# Patient Record
Sex: Female | Born: 1935 | Race: White | Hispanic: No | Marital: Married
Health system: Midwestern US, Community
[De-identification: ages and names within clinical notes are randomized; demographics above are authoritative.]

## PROBLEM LIST (undated history)

## (undated) DIAGNOSIS — I10 Essential (primary) hypertension: Secondary | ICD-10-CM

## (undated) DIAGNOSIS — E878 Other disorders of electrolyte and fluid balance, not elsewhere classified: Secondary | ICD-10-CM

## (undated) DIAGNOSIS — R5383 Other fatigue: Secondary | ICD-10-CM

## (undated) DIAGNOSIS — F419 Anxiety disorder, unspecified: Secondary | ICD-10-CM

## (undated) DIAGNOSIS — M109 Gout, unspecified: Secondary | ICD-10-CM

## (undated) DIAGNOSIS — J309 Allergic rhinitis, unspecified: Secondary | ICD-10-CM

## (undated) DIAGNOSIS — M810 Age-related osteoporosis without current pathological fracture: Secondary | ICD-10-CM

## (undated) DIAGNOSIS — F329 Major depressive disorder, single episode, unspecified: Secondary | ICD-10-CM

## (undated) DIAGNOSIS — M069 Rheumatoid arthritis, unspecified: Secondary | ICD-10-CM

## (undated) DIAGNOSIS — G4730 Sleep apnea, unspecified: Secondary | ICD-10-CM

## (undated) DIAGNOSIS — S39012A Strain of muscle, fascia and tendon of lower back, initial encounter: Secondary | ICD-10-CM

## (undated) DIAGNOSIS — J40 Bronchitis, not specified as acute or chronic: Secondary | ICD-10-CM

## (undated) DIAGNOSIS — R109 Unspecified abdominal pain: Secondary | ICD-10-CM

## (undated) DIAGNOSIS — E7800 Pure hypercholesterolemia, unspecified: Secondary | ICD-10-CM

## (undated) DIAGNOSIS — R519 Headache, unspecified: Secondary | ICD-10-CM

## (undated) DIAGNOSIS — E039 Hypothyroidism, unspecified: Secondary | ICD-10-CM

## (undated) DIAGNOSIS — L0390 Cellulitis, unspecified: Secondary | ICD-10-CM

## (undated) DIAGNOSIS — K5792 Diverticulitis of intestine, part unspecified, without perforation or abscess without bleeding: Secondary | ICD-10-CM

## (undated) DIAGNOSIS — G43909 Migraine, unspecified, not intractable, without status migrainosus: Secondary | ICD-10-CM

## (undated) DIAGNOSIS — M419 Scoliosis, unspecified: Secondary | ICD-10-CM

## (undated) DIAGNOSIS — K5790 Diverticulosis of intestine, part unspecified, without perforation or abscess without bleeding: Secondary | ICD-10-CM

## (undated) DIAGNOSIS — K219 Gastro-esophageal reflux disease without esophagitis: Secondary | ICD-10-CM

## (undated) DIAGNOSIS — M2550 Pain in unspecified joint: Secondary | ICD-10-CM

## (undated) DIAGNOSIS — R51 Headache: Secondary | ICD-10-CM

## (undated) DIAGNOSIS — R52 Pain, unspecified: Secondary | ICD-10-CM

## (undated) DIAGNOSIS — R0602 Shortness of breath: Secondary | ICD-10-CM

## (undated) DIAGNOSIS — J189 Pneumonia, unspecified organism: Secondary | ICD-10-CM

## (undated) DIAGNOSIS — L0291 Cutaneous abscess, unspecified: Secondary | ICD-10-CM

## (undated) DIAGNOSIS — Z95828 Presence of other vascular implants and grafts: Secondary | ICD-10-CM

## (undated) DIAGNOSIS — R1310 Dysphagia, unspecified: Secondary | ICD-10-CM

## (undated) DIAGNOSIS — J9690 Respiratory failure, unspecified, unspecified whether with hypoxia or hypercapnia: Principal | ICD-10-CM

## (undated) DIAGNOSIS — R0600 Dyspnea, unspecified: Secondary | ICD-10-CM

## (undated) DIAGNOSIS — M79602 Pain in left arm: Secondary | ICD-10-CM

## (undated) DIAGNOSIS — R609 Edema, unspecified: Secondary | ICD-10-CM

## (undated) DIAGNOSIS — Z452 Encounter for adjustment and management of vascular access device: Secondary | ICD-10-CM

## (undated) DIAGNOSIS — J398 Other specified diseases of upper respiratory tract: Secondary | ICD-10-CM

## (undated) DIAGNOSIS — Z4659 Encounter for fitting and adjustment of other gastrointestinal appliance and device: Secondary | ICD-10-CM

## (undated) HISTORY — DX: Essential (primary) hypertension: I10

## (undated) HISTORY — DX: Gastro-esophageal reflux disease without esophagitis: K21.9

## (undated) HISTORY — PX: TOTAL ABDOMINAL HYSTERECTOMY: SHX209

## (undated) HISTORY — DX: Major depressive disorder, single episode, unspecified: F32.9

## (undated) HISTORY — DX: Diverticulitis of intestine, part unspecified, without perforation or abscess without bleeding: K57.92

## (undated) HISTORY — DX: Pain in unspecified joint: M25.50

## (undated) HISTORY — DX: Scoliosis, unspecified: M41.9

## (undated) HISTORY — DX: Pure hypercholesterolemia, unspecified: E78.00

## (undated) HISTORY — DX: Gout, unspecified: M10.9

## (undated) HISTORY — DX: Headache: R51

## (undated) HISTORY — PX: CHOLECYSTECTOMY: SHX55

## (undated) HISTORY — DX: Age-related osteoporosis without current pathological fracture: M81.0

## (undated) HISTORY — DX: Other fatigue: R53.83

## (undated) HISTORY — DX: Unspecified abdominal pain: R10.9

## (undated) HISTORY — DX: Migraine, unspecified, not intractable, without status migrainosus: G43.909

## (undated) HISTORY — DX: Allergic rhinitis, unspecified: J30.9

## (undated) HISTORY — DX: Hypothyroidism, unspecified: E03.9

## (undated) HISTORY — DX: Cellulitis, unspecified: L03.90

## (undated) HISTORY — PX: COLONOSCOPY: SHX174

## (undated) HISTORY — DX: Anxiety disorder, unspecified: F41.9

## (undated) HISTORY — PX: CARPAL TUNNEL RELEASE: SHX101

## (undated) HISTORY — DX: Diverticulosis of intestine, part unspecified, without perforation or abscess without bleeding: K57.90

## (undated) HISTORY — DX: Strain of muscle, fascia and tendon of lower back, initial encounter: S39.012A

## (undated) HISTORY — DX: Bronchitis, not specified as acute or chronic: J40

## (undated) HISTORY — PX: HEMORROIDECTOMY: SUR656

## (undated) HISTORY — DX: Other disorders of electrolyte and fluid balance, not elsewhere classified: E87.8

## (undated) HISTORY — DX: Headache, unspecified: R51.9

## (undated) HISTORY — DX: Rheumatoid arthritis, unspecified: M06.9

## (undated) HISTORY — PX: POLYPECTOMY: SHX149

## (undated) HISTORY — PX: UPPER GASTROINTESTINAL ENDOSCOPY: SHX188

---

## 2002-07-11 ENCOUNTER — Ambulatory Visit (HOSPITAL_BASED_OUTPATIENT_CLINIC_OR_DEPARTMENT_OTHER): Admission: RE | Admit: 2002-07-11 | Discharge: 2002-07-11 | Payer: Self-pay | Admitting: Plastic Surgery

## 2002-08-23 ENCOUNTER — Ambulatory Visit (HOSPITAL_COMMUNITY): Admission: RE | Admit: 2002-08-23 | Discharge: 2002-08-23 | Payer: Self-pay | Admitting: Internal Medicine

## 2011-08-20 DIAGNOSIS — Z01419 Encounter for gynecological examination (general) (routine) without abnormal findings: Secondary | ICD-10-CM | POA: Diagnosis not present

## 2011-10-13 DIAGNOSIS — N951 Menopausal and female climacteric states: Secondary | ICD-10-CM | POA: Diagnosis not present

## 2011-10-30 DIAGNOSIS — H01009 Unspecified blepharitis unspecified eye, unspecified eyelid: Secondary | ICD-10-CM | POA: Diagnosis not present

## 2011-10-30 DIAGNOSIS — H538 Other visual disturbances: Secondary | ICD-10-CM | POA: Diagnosis not present

## 2011-10-30 DIAGNOSIS — Z961 Presence of intraocular lens: Secondary | ICD-10-CM | POA: Diagnosis not present

## 2011-11-06 DIAGNOSIS — I10 Essential (primary) hypertension: Secondary | ICD-10-CM | POA: Diagnosis not present

## 2011-11-06 DIAGNOSIS — E7800 Pure hypercholesterolemia, unspecified: Secondary | ICD-10-CM | POA: Diagnosis not present

## 2011-11-12 DIAGNOSIS — E7800 Pure hypercholesterolemia, unspecified: Secondary | ICD-10-CM | POA: Diagnosis not present

## 2011-11-12 DIAGNOSIS — E039 Hypothyroidism, unspecified: Secondary | ICD-10-CM | POA: Diagnosis not present

## 2011-11-12 DIAGNOSIS — I10 Essential (primary) hypertension: Secondary | ICD-10-CM | POA: Diagnosis not present

## 2011-11-12 DIAGNOSIS — J309 Allergic rhinitis, unspecified: Secondary | ICD-10-CM | POA: Diagnosis not present

## 2011-11-12 DIAGNOSIS — K2100 Gastro-esophageal reflux disease with esophagitis, without bleeding: Secondary | ICD-10-CM | POA: Diagnosis not present

## 2011-11-24 DIAGNOSIS — L0211 Cutaneous abscess of neck: Secondary | ICD-10-CM | POA: Diagnosis not present

## 2012-01-03 LAB — COMPREHENSIVE METABOLIC PANEL
ALT: 473 U/L — ABNORMAL HIGH (ref 10–49)
AST: 120 U/L — ABNORMAL HIGH (ref 0–33)
Albumin: 3.5 g/dL (ref 3.2–4.8)
Alkaline Phosphatase: 74 U/L (ref 45–129)
BUN: 14 mg/dL (ref 9–23)
Calcium: 8.6 mg/dL (ref 8.6–10.5)
Chloride: 92 mmol/L — ABNORMAL LOW (ref 99–109)
Creatinine: 0.4 mg/dL — ABNORMAL LOW (ref 0.5–1.1)
Glucose: 213 mg/dL — ABNORMAL HIGH (ref 70–110)
Potassium: 2.9 mmol/L — ABNORMAL LOW (ref 3.5–5.5)
Sodium: 141 mmol/L (ref 132–146)
Total Bilirubin: 0.5 mg/dL (ref 0.3–1.2)
Total Protein: 5.6 g/dL — ABNORMAL LOW (ref 5.7–8.2)

## 2012-01-03 LAB — CBC
Hematocrit: 30.9 % — ABNORMAL LOW (ref 34.0–48.0)
Hemoglobin: 9.9 g/dL — ABNORMAL LOW (ref 11.5–15.5)
MCH: 29.2 pg (ref 26.0–35.0)
MCHC: 32 % (ref 32.0–34.5)
MCV: 91.3 fL (ref 80.0–99.9)
MPV: 8.7 fL (ref 7.0–12.0)
Platelets: 181 E9/L (ref 130–450)
RBC: 3.38 E12/L — ABNORMAL LOW (ref 3.50–5.50)
RDW: 19.7 fL — ABNORMAL HIGH (ref 11.5–15.0)
WBC: 8.1 E9/L (ref 4.5–11.5)

## 2012-01-03 LAB — GFR CALCULATED: Gfr Calculated: >60 mL/min/{1.73_m2} (ref >=60)

## 2012-01-03 LAB — MAGNESIUM: Magnesium: 2.2 mg/dL (ref 1.3–2.7)

## 2012-01-04 LAB — POTASSIUM: Potassium: 3.5 mmol/L (ref 3.5–5.5)

## 2012-01-05 LAB — URINALYSIS WITH MICROSCOPIC
Bilirubin Urine: NEGATIVE
Glucose, Ur: NEGATIVE mg/dL
Ketones, Urine: NEGATIVE mg/dL
Nitrite, Urine: NEGATIVE
Specific Gravity, UA: 1.01 (ref <=1.035)
Urobilinogen, Urine: 0.2 E.U./dL (ref 0.2–1.0)
pH, UA: 6.5 (ref 5.0–8.5)

## 2012-01-05 LAB — BASIC METABOLIC PANEL
BUN: 26 mg/dL — ABNORMAL HIGH (ref 9–23)
Calcium: 9.3 mg/dL (ref 8.6–10.5)
Chloride: 96 mmol/L — ABNORMAL LOW (ref 99–109)
Creatinine: 0.5 mg/dL (ref 0.5–1.1)
Glucose: 245 mg/dL — ABNORMAL HIGH (ref 70–110)
Potassium: 3.4 mmol/L — ABNORMAL LOW (ref 3.5–5.5)
Sodium: 147 mmol/L — ABNORMAL HIGH (ref 132–146)

## 2012-01-05 LAB — GFR CALCULATED: Gfr Calculated: >60 mL/min/{1.73_m2} (ref >=60)

## 2012-01-09 LAB — PREALBUMIN: Prealbumin: 29 mg/dL (ref 10–40)

## 2012-01-16 LAB — PREALBUMIN: Prealbumin: 29 mg/dL (ref 10–40)

## 2012-01-17 LAB — CBC
Hematocrit: 29.9 % — ABNORMAL LOW (ref 34.0–48.0)
Hemoglobin: 9.8 g/dL — ABNORMAL LOW (ref 11.5–15.5)
MCH: 29.2 pg (ref 26.0–35.0)
MCHC: 32.6 % (ref 32.0–34.5)
MCV: 89.6 fL (ref 80.0–99.9)
MPV: 8 fL (ref 7.0–12.0)
Platelets: 195 E9/L (ref 130–450)
RBC: 3.34 E12/L — ABNORMAL LOW (ref 3.50–5.50)
RDW: 17.2 fL — ABNORMAL HIGH (ref 11.5–15.0)
WBC: 5.1 E9/L (ref 4.5–11.5)

## 2012-01-17 LAB — COMPREHENSIVE METABOLIC PANEL
ALT: 158 U/L — ABNORMAL HIGH (ref 10–49)
AST: 58 U/L — ABNORMAL HIGH (ref 0–33)
Albumin: 3.5 g/dL (ref 3.2–4.8)
Alkaline Phosphatase: 70 U/L (ref 45–129)
BUN: 10 mg/dL (ref 9–23)
Calcium: 8.8 mg/dL (ref 8.6–10.5)
Chloride: 94 mmol/L — ABNORMAL LOW (ref 99–109)
Creatinine: 0.5 mg/dL (ref 0.5–1.1)
Glucose: 150 mg/dL — ABNORMAL HIGH (ref 70–110)
Potassium: 3.3 mmol/L — ABNORMAL LOW (ref 3.5–5.5)
Sodium: 142 mmol/L (ref 132–146)
Total Bilirubin: 0.3 mg/dL (ref 0.3–1.2)
Total Protein: 5.8 g/dL (ref 5.7–8.2)

## 2012-01-17 LAB — GFR CALCULATED: Gfr Calculated: >60 mL/min/{1.73_m2} (ref >=60)

## 2012-01-22 LAB — PREALBUMIN: Prealbumin: 31 mg/dL (ref 10–40)

## 2012-01-23 LAB — PREALBUMIN: Prealbumin: 31 mg/dL (ref 10–40)

## 2012-01-25 LAB — COMPREHENSIVE METABOLIC PANEL
ALT: 79 U/L — ABNORMAL HIGH (ref 10–49)
AST: 34 U/L — ABNORMAL HIGH (ref 0–33)
Albumin: 3.9 g/dL (ref 3.2–4.8)
Alkaline Phosphatase: 78 U/L (ref 45–129)
BUN: 10 mg/dL (ref 9–23)
Calcium: 8.9 mg/dL (ref 8.6–10.5)
Chloride: 94 mmol/L — ABNORMAL LOW (ref 99–109)
Creatinine: 0.5 mg/dL (ref 0.5–1.1)
Glucose: 66 mg/dL — ABNORMAL LOW (ref 70–110)
Potassium: 3.1 mmol/L — ABNORMAL LOW (ref 3.5–5.5)
Sodium: 141 mmol/L (ref 132–146)
Total Bilirubin: 0.4 mg/dL (ref 0.3–1.2)
Total Protein: 6.2 g/dL (ref 5.7–8.2)

## 2012-01-25 LAB — CBC WITH AUTO DIFFERENTIAL
Absolute Eos #: 0 E9/L — ABNORMAL LOW (ref 0.05–0.50)
Absolute Lymph #: 2.58 E9/L (ref 1.50–4.00)
Absolute Mono #: 1.09 E9/L — ABNORMAL HIGH (ref 0.10–0.95)
Absolute Neut #: 9.93 E9/L — ABNORMAL HIGH (ref 1.80–7.30)
Basophils Absolute: 0 E9/L (ref 0.00–0.20)
Basophils: 0 % (ref 0–2)
Eosinophils %: 0 % (ref 0–6)
Hematocrit: 35.4 % (ref 34.0–48.0)
Hemoglobin: 11.6 g/dL (ref 11.5–15.5)
Lymphocytes: 19 % — ABNORMAL LOW (ref 20–42)
MCH: 29 pg (ref 26.0–35.0)
MCHC: 32.8 % (ref 32.0–34.5)
MCV: 88.4 fL (ref 80.0–99.9)
MPV: 8 fL (ref 7.0–12.0)
Metamyelocytes: 1 % (ref 0–1)
Monocytes: 8 % (ref 2–12)
Platelet Estimate: NORMAL
Platelets: 256 E9/L (ref 130–450)
RBC: 4.01 E12/L (ref 3.50–5.50)
RDW: 17.1 fL — ABNORMAL HIGH (ref 11.5–15.0)
Seg Neutrophils: 72 % (ref 43–80)
WBC: 13.6 E9/L — ABNORMAL HIGH (ref 4.5–11.5)

## 2012-01-25 LAB — PHOSPHORUS: Phosphorus: 2.9 mg/dL (ref 2.4–5.1)

## 2012-01-25 LAB — ANISOCYTOSIS

## 2012-01-25 LAB — GFR CALCULATED: Gfr Calculated: >60 mL/min/{1.73_m2} (ref >=60)

## 2012-01-25 LAB — MAGNESIUM: Magnesium: 1.9 mg/dL (ref 1.3–2.7)

## 2012-01-26 LAB — PHOSPHORUS: Phosphorus: 2.9 mg/dL (ref 2.4–5.1)

## 2012-01-26 LAB — MAGNESIUM: Magnesium: 1.4 mg/dL (ref 1.3–2.7)

## 2012-01-26 LAB — GFR CALCULATED: Gfr Calculated: >60 mL/min/{1.73_m2} (ref >=60)

## 2012-01-26 LAB — BASIC METABOLIC PANEL
BUN: 7 mg/dL — ABNORMAL LOW (ref 9–23)
CO2: 34 mmol/L — ABNORMAL HIGH (ref 20–31)
Calcium: 7.2 mg/dL — ABNORMAL LOW (ref 8.6–10.5)
Chloride: 105 mmol/L (ref 99–109)
Creatinine: 0.3 mg/dL — ABNORMAL LOW (ref 0.5–1.1)
Glucose: 84 mg/dL (ref 70–110)
Potassium: 2.4 mmol/L — CL (ref 3.5–5.5)
Sodium: 142 mmol/L (ref 132–146)

## 2012-01-27 LAB — POTASSIUM: Potassium: 3.5 mmol/L (ref 3.5–5.5)

## 2012-01-28 LAB — POTASSIUM: Potassium: 3.5 mmol/L (ref 3.5–5.5)

## 2012-03-17 DIAGNOSIS — E7800 Pure hypercholesterolemia, unspecified: Secondary | ICD-10-CM | POA: Diagnosis not present

## 2012-03-17 DIAGNOSIS — E039 Hypothyroidism, unspecified: Secondary | ICD-10-CM | POA: Diagnosis not present

## 2012-03-17 DIAGNOSIS — I10 Essential (primary) hypertension: Secondary | ICD-10-CM | POA: Diagnosis not present

## 2012-03-19 DIAGNOSIS — E039 Hypothyroidism, unspecified: Secondary | ICD-10-CM | POA: Diagnosis not present

## 2012-03-19 DIAGNOSIS — K2100 Gastro-esophageal reflux disease with esophagitis, without bleeding: Secondary | ICD-10-CM | POA: Diagnosis not present

## 2012-03-19 DIAGNOSIS — E7800 Pure hypercholesterolemia, unspecified: Secondary | ICD-10-CM | POA: Diagnosis not present

## 2012-03-19 DIAGNOSIS — I10 Essential (primary) hypertension: Secondary | ICD-10-CM | POA: Diagnosis not present

## 2012-07-27 DIAGNOSIS — F321 Major depressive disorder, single episode, moderate: Secondary | ICD-10-CM | POA: Diagnosis not present

## 2012-07-27 DIAGNOSIS — R5383 Other fatigue: Secondary | ICD-10-CM | POA: Diagnosis not present

## 2012-07-27 DIAGNOSIS — F17200 Nicotine dependence, unspecified, uncomplicated: Secondary | ICD-10-CM | POA: Diagnosis not present

## 2012-07-27 DIAGNOSIS — E039 Hypothyroidism, unspecified: Secondary | ICD-10-CM | POA: Diagnosis not present

## 2012-07-27 DIAGNOSIS — F320 Major depressive disorder, single episode, mild: Secondary | ICD-10-CM | POA: Diagnosis not present

## 2012-07-27 DIAGNOSIS — R5381 Other malaise: Secondary | ICD-10-CM | POA: Diagnosis not present

## 2012-08-03 DIAGNOSIS — Z23 Encounter for immunization: Secondary | ICD-10-CM | POA: Diagnosis not present

## 2012-09-09 DIAGNOSIS — I10 Essential (primary) hypertension: Secondary | ICD-10-CM | POA: Diagnosis not present

## 2012-09-09 DIAGNOSIS — E7800 Pure hypercholesterolemia, unspecified: Secondary | ICD-10-CM | POA: Diagnosis not present

## 2012-09-22 DIAGNOSIS — K5732 Diverticulitis of large intestine without perforation or abscess without bleeding: Secondary | ICD-10-CM | POA: Diagnosis not present

## 2012-09-22 DIAGNOSIS — E039 Hypothyroidism, unspecified: Secondary | ICD-10-CM | POA: Diagnosis not present

## 2012-09-22 DIAGNOSIS — F320 Major depressive disorder, single episode, mild: Secondary | ICD-10-CM | POA: Diagnosis not present

## 2012-09-22 DIAGNOSIS — I10 Essential (primary) hypertension: Secondary | ICD-10-CM | POA: Diagnosis not present

## 2012-09-22 DIAGNOSIS — E7800 Pure hypercholesterolemia, unspecified: Secondary | ICD-10-CM | POA: Diagnosis not present

## 2012-09-22 DIAGNOSIS — F17200 Nicotine dependence, unspecified, uncomplicated: Secondary | ICD-10-CM | POA: Diagnosis not present

## 2012-11-30 DIAGNOSIS — R922 Inconclusive mammogram: Secondary | ICD-10-CM | POA: Diagnosis not present

## 2012-11-30 DIAGNOSIS — N6459 Other signs and symptoms in breast: Secondary | ICD-10-CM | POA: Diagnosis not present

## 2012-11-30 DIAGNOSIS — N649 Disorder of breast, unspecified: Secondary | ICD-10-CM | POA: Diagnosis not present

## 2012-12-02 ENCOUNTER — Other Ambulatory Visit: Payer: Self-pay | Admitting: Family Medicine

## 2012-12-02 DIAGNOSIS — R922 Inconclusive mammogram: Secondary | ICD-10-CM

## 2012-12-02 DIAGNOSIS — N6459 Other signs and symptoms in breast: Secondary | ICD-10-CM

## 2012-12-16 ENCOUNTER — Ambulatory Visit
Admission: RE | Admit: 2012-12-16 | Discharge: 2012-12-16 | Disposition: A | Discharge status: Home / Self Care | Payer: Medicare Other | Source: Ambulatory Visit | Attending: Family Medicine | Admitting: Family Medicine

## 2012-12-16 DIAGNOSIS — N6009 Solitary cyst of unspecified breast: Secondary | ICD-10-CM | POA: Diagnosis not present

## 2012-12-16 DIAGNOSIS — R922 Inconclusive mammogram: Secondary | ICD-10-CM

## 2012-12-16 DIAGNOSIS — N6459 Other signs and symptoms in breast: Secondary | ICD-10-CM

## 2012-12-16 MED ORDER — GADOBENATE DIMEGLUMINE 529 MG/ML IV SOLN
14.0000 mL | Freq: Once | INTRAVENOUS | Status: AC | PRN
  Administered 2012-12-16: 14 mL via INTRAVENOUS

## 2013-01-18 DIAGNOSIS — E7800 Pure hypercholesterolemia, unspecified: Secondary | ICD-10-CM | POA: Diagnosis not present

## 2013-01-18 DIAGNOSIS — E039 Hypothyroidism, unspecified: Secondary | ICD-10-CM | POA: Diagnosis not present

## 2013-01-18 DIAGNOSIS — I10 Essential (primary) hypertension: Secondary | ICD-10-CM | POA: Diagnosis not present

## 2013-01-25 DIAGNOSIS — E7800 Pure hypercholesterolemia, unspecified: Secondary | ICD-10-CM | POA: Diagnosis not present

## 2013-01-25 DIAGNOSIS — F17200 Nicotine dependence, unspecified, uncomplicated: Secondary | ICD-10-CM | POA: Diagnosis not present

## 2013-01-25 DIAGNOSIS — E039 Hypothyroidism, unspecified: Secondary | ICD-10-CM | POA: Diagnosis not present

## 2013-01-25 DIAGNOSIS — F320 Major depressive disorder, single episode, mild: Secondary | ICD-10-CM | POA: Diagnosis not present

## 2013-02-17 DIAGNOSIS — H43399 Other vitreous opacities, unspecified eye: Secondary | ICD-10-CM | POA: Diagnosis not present

## 2013-02-17 DIAGNOSIS — H538 Other visual disturbances: Secondary | ICD-10-CM | POA: Diagnosis not present

## 2013-02-17 DIAGNOSIS — Z961 Presence of intraocular lens: Secondary | ICD-10-CM | POA: Diagnosis not present

## 2013-06-02 ENCOUNTER — Encounter

## 2013-06-02 LAB — COMPREHENSIVE METABOLIC PANEL
ALT: 7 U/L (ref 0–32)
AST: 16 U/L (ref 0–31)
Albumin: 2.2 g/dL — ABNORMAL LOW (ref 3.5–5.2)
Alkaline Phosphatase: 95 U/L (ref 35–104)
BUN: 12 mg/dL (ref 8–23)
CO2: 36 mmol/L — ABNORMAL HIGH (ref 22–29)
Calcium: 7.9 mg/dL — ABNORMAL LOW (ref 8.6–10.2)
Chloride: 104 mmol/L (ref 98–107)
Creatinine: 0.5 mg/dL (ref 0.5–1.0)
GFR African American: >60
GFR Non-African American: >60 mL/min/{1.73_m2} (ref >=60)
Glucose: 78 mg/dL (ref 74–109)
Potassium: 3.9 mmol/L (ref 3.5–5.0)
Sodium: 141 mmol/L (ref 132–146)
Total Bilirubin: 0.3 mg/dL (ref 0.0–1.2)
Total Protein: 4.9 g/dL — ABNORMAL LOW (ref 6.4–8.3)

## 2013-06-02 LAB — CBC WITH AUTO DIFFERENTIAL
Basophils %: 1 % (ref 0–2)
Basophils Absolute: 0.18 E9/L (ref 0.00–0.20)
Eosinophils %: 6 % (ref 0–6)
Eosinophils Absolute: 0.74 E9/L — ABNORMAL HIGH (ref 0.05–0.50)
Hematocrit: 24.1 % — ABNORMAL LOW (ref 34.0–48.0)
Hemoglobin: 7.4 g/dL — ABNORMAL LOW (ref 11.5–15.5)
Lymphocytes %: 17 % — ABNORMAL LOW (ref 20–42)
Lymphocytes Absolute: 2.2 E9/L (ref 1.50–4.00)
MCH: 25.8 pg — ABNORMAL LOW (ref 26.0–35.0)
MCHC: 30.9 % — ABNORMAL LOW (ref 32.0–34.5)
MCV: 83.6 fL (ref 80.0–99.9)
MPV: 8.9 fL (ref 7.0–12.0)
Monocytes %: 7 % (ref 2–12)
Monocytes Absolute: 0.95 E9/L (ref 0.10–0.95)
Neutrophils %: 68 % (ref 43–80)
Neutrophils Absolute: 8.7 E9/L — ABNORMAL HIGH (ref 1.80–7.30)
PLATELET SLIDE REVIEW: NORMAL
Platelets: 247 E9/L (ref 130–450)
RBC: 2.89 E12/L — ABNORMAL LOW (ref 3.50–5.50)
RDW: 22.2 fL — ABNORMAL HIGH (ref 11.5–15.0)
WBC: 12.8 E9/L — ABNORMAL HIGH (ref 4.5–11.5)

## 2013-06-02 LAB — MAGNESIUM: Magnesium: 1.8 mg/dL (ref 1.6–2.6)

## 2013-06-02 LAB — PHOSPHORUS: Phosphorus: 2.2 mg/dL — ABNORMAL LOW (ref 2.5–4.5)

## 2013-06-02 LAB — PROTIME-INR
INR: 2.2
Protime: 25.7 s — ABNORMAL HIGH (ref 9.3–12.1)

## 2013-06-02 LAB — PREALBUMIN: Prealbumin: 5 mg/dL — ABNORMAL LOW (ref 20–40)

## 2013-06-03 DIAGNOSIS — R109 Unspecified abdominal pain: Secondary | ICD-10-CM | POA: Diagnosis not present

## 2013-06-03 DIAGNOSIS — K5732 Diverticulitis of large intestine without perforation or abscess without bleeding: Secondary | ICD-10-CM | POA: Diagnosis not present

## 2013-06-03 LAB — BASIC METABOLIC PANEL
BUN: 13 mg/dL (ref 8–23)
CO2: 37 mmol/L — ABNORMAL HIGH (ref 22–29)
Calcium: 7.6 mg/dL — ABNORMAL LOW (ref 8.6–10.2)
Chloride: 102 mmol/L (ref 98–107)
Creatinine: 0.5 mg/dL (ref 0.5–1.0)
GFR African American: >60
GFR Non-African American: >60 mL/min/{1.73_m2} (ref >=60)
Glucose: 131 mg/dL — ABNORMAL HIGH (ref 74–109)
Potassium: 3.3 mmol/L — ABNORMAL LOW (ref 3.5–5.0)
Sodium: 141 mmol/L (ref 132–146)

## 2013-06-03 LAB — CBC
Hematocrit: 22.5 % — ABNORMAL LOW (ref 34.0–48.0)
Hemoglobin: 7.1 g/dL — ABNORMAL LOW (ref 11.5–15.5)
MCH: 26.1 pg (ref 26.0–35.0)
MCHC: 31.5 % — ABNORMAL LOW (ref 32.0–34.5)
MCV: 82.7 fL (ref 80.0–99.9)
MPV: 8.9 fL (ref 7.0–12.0)
Platelets: 211 E9/L (ref 130–450)
RBC: 2.72 E12/L — ABNORMAL LOW (ref 3.50–5.50)
RDW: 22.5 fL — ABNORMAL HIGH (ref 11.5–15.0)
WBC: 11.7 E9/L — ABNORMAL HIGH (ref 4.5–11.5)

## 2013-06-03 LAB — PROTIME-INR
INR: 2.1
Protime: 24.1 s — ABNORMAL HIGH (ref 9.3–12.1)

## 2013-06-03 LAB — TYPE AND SCREEN
ABO/Rh: A NEG
Antibody Screen: NEGATIVE

## 2013-06-03 LAB — PHOSPHORUS: Phosphorus: 2 mg/dL — ABNORMAL LOW (ref 2.5–4.5)

## 2013-06-03 LAB — BRAIN NATRIURETIC PEPTIDE: Pro-BNP: 748 pg/mL — ABNORMAL HIGH (ref 0–450)

## 2013-06-03 LAB — MAGNESIUM: Magnesium: 1.7 mg/dL (ref 1.6–2.6)

## 2013-06-03 NOTE — Consults (Signed)
TYIA, BINFORD                               [id number]      DATE OF CONSULTATION:  06/02/2013      CONSULTANT:  Rolan Bucco, M.D.   REQUESTING PHYSICIAN:  Mliss Sax, M.D.      REASON FOR CONSULTATION:  Congestive heart failure.      HISTORY OF PRESENT ILLNESS:  Bridget Aguilar is a 77 year old Caucasian lady   with a documented history of COPD with chronic respiratory failure, status   post tracheostomy, recent cardiopulmonary arrest followed by seizure-like   activity, DVT with questionable IVC filter placement, Addison disease, and   recent C. difficile infection.  Bridget Aguilar is unable to provide a detailed   history, and therefore, the information for the consult is taken from review   of her chart, and, in addition, records from her recent hospitalization at   [facility] have been requested.  According to transfer records from [facility], she was   hospitalized there from October 15th-October 29th, with pseudomonas and   underwent multiple bronchoscopies.  She suffered a PEA arrest, although the   date of that is unknown, and eventually required tracheostomy.  During that   hospitalization, she was also treated for DVT and C. difficile infection.   She was transferred to [facility] on June 01, 2013, for further care.  She has been followed there by Dr. Regina Eck as   well as Dr. Verdis Frederickson, and was found to be in congestive heart failure.   Dr. Arnette Norris was asked by Dr. Regina Eck to see her in consultation regarding the   CHF.      Currently, at the time of consultation, Bridget Aguilar is resting comfortably   in bed.  She does mouth some words but is unable to provide detailed answers   and also falls asleep easily.  She denies any dyspnea or pain at this time.   Her vital signs are stable, and although she appears ill, she is in no acute   distress.      PAST MEDICAL AND SURGICAL HISTORY:  (Obtained from chart review).   1.    Chronic respiratory therapy.         A.  Status post  tracheostomy.         B.  Status post multiple bronchoscopies.  Most recently, on May 30, 2013, she had been         found to have diffuse airway inflammation and mucus plugging.         C.  Recent pseudomonas infection.   2.    Obstructive sleep apnea.   3.    Diabetes mellitus.   4.    Reported ejection fraction of 55% in [facility] transfer records, full      echocardiogram report is not available.   5.    Addison disease.   6.    Status post thyroidectomy, now on replacement therapy.   7.    Gastroesophageal reflux disease.   8.    Back surgery (according to her chart, she underwent fusion and disk      repair, but details are not available).   9.    History of DVT, on chronic Coumadin therapy, and questionable PE.   10.   Questionable IVC filter placement.   11.   Recent C. difficile infection, treated  with vancomycin and Flagyl.   12.   Status post left shoulder surgery.   13.   Status post bilateral hip replacements.   14.   Obesity.      FAMILY HISTORY:  Family history is unknown.      ALLERGIES:  PENICILLIN, DIAZEPAM, IVP DYE, and PRECEDEX.      MEDICATIONS:  Hospital medications at this time include:  Lopressor 6.25 mg   twice daily, K-Dur 20 mEq daily, levothyroxine 25 mcg daily, Coumadin 1 mg   daily, Mucomyst inhaler 4 times daily, DuoNeb inhaler 4 times daily, vitamin   C 500 mg twice daily, cefepime 2 g IV every 12 hours, vitamin D 2000 units   daily, guaifenesin 400 mg 3 times daily, Cortef 10 mg twice daily, magnesium   oxide 400 mg daily, multivitamin daily, K-Phos 250 mmol twice daily, Senokot   8.6 mg 2 tablets daily, Zoloft 50 mg daily, zinc 220 mg daily, regular   insulin scale, and Levemir insulin 5 units daily at bedtimes.      The medications that she was taking prior to hospital admission are unknown.         SOCIAL HISTORY:  Prior to being admitted to the hospital earlier this month,   she lived at home with her husband, where he was her primary caregiver.  She,   apparently, does have a  remote history of tobacco abuse, but it is unknown   how many years ago she stopped smoking.      PHYSICAL EXAMINATION:  Vital signs at this time include temperature of 99.8   degrees Fahrenheit, heart rate 101 beats per minute, respirations 20 breaths   per minute with a blood pressure of 108/50.  General:  This is a   well-developed, obese Caucasian female who appears her stated age.  HEENT:   Eyes, conjunctivae are pink with no noted xanthomas.  Throat/mouth:  Oral   mucosa is pink and moist.  Neck:  She does have a tracheostomy and is   receiving oxygen via trach mask.  It is difficult to assess for JVD.  She has   a short, thick neck.  Chest is symmetric and nontender to light palpation.   She has coarse breath sounds in the anterior lung fields and rales in the   lateral fields.  Respirations are even and unlabored.  Cardiovascular:  No   carotid bruits on inspection.  No noted pulsations on palpation, no heaves or   thrills.  PMI is difficult to assess because of her body habitus.  On   auscultation, she has a regular rhythm, which is slightly tachycardic at   times, with no murmurs, rubs or gallops.  Peripheral vascular shows   generalized edema with 2-3+ pitting edema of the lower extremities and 1+   pitting edema of the left upper extremity.  Pedal pulses are weakly palpable.   There is no clubbing or cyanosis.  Abdomen is soft, obese, nontender to light   palpation.  Bowel sounds are present in all 4 quadrants.  GU:  Foley catheter   is draining dark, yellow urine without sediment.  Musculoskeletal:  She has   limited movement of her extremities,her lower extremities.  Skin is   warm and dry with stasis dermatitis changes to the lower extremities and what   appears to be a skin graft to her right lower leg.  Neurologic/psychiatric:   She is oriented to person and place.  She follows some commands and has  a   pleasant affect.  She mouths some words through her tracheostomy.      LABORATORY DATA/X-RAY  STUDIES:  Today's PT/INR was 25.7 and 2.2.  CBC   includes white blood cell count of 12.8, hemoglobin 7.4, hematocrit 24.1,   platelet count 247,000.  CMP includes sodium of 141, potassium 3.9, chloride   104, CO2 36, BUN 12, creatinine 0.5, with glucose of 78.  Albumin is 2.2, and   phosphorus is also 2.2.  Magnesium is 1.8.      Ultrasound of the left upper extremity, to rule out DVT, was negative.   For the remainder of laboratory and diagnostic tests, please see history of   present illness.      ASSESSMENT:  Per Dr. Arnette Norris:   1.    Congestive heart failure.  Acute-on-chronic, most likely diastolic      (ejection fraction of 50-55% as per records).  We will give Lasix.  We      will follow response.   2.    Chronic respiratory failure status post tracheostomy.   3.    Status post cardiac arrest.   4.    History of PE/DVT, on Coumadin.   5.    Anemia.  We will follow H H.  May need to give RBCs if hemoglobin stays      below 8 g/dL.   6.    Addison disease.  On hydrocortisone.   7.    Diabetes mellitus.      RECOMMENDATIONS:  Per Dr. Arnette Norris:   1.    Lasix 20 mg IV 1 time.   2.    Intake and output.   3.    Old records have been requested.   4.    We will follow BMP and CBC.   5.    Continue the rest of her treatment.   6.    Discussed with the patient at bedside.            Dictated by:  Dortha Kern, C.N.S.for Dr.Jelani Vreeland    I personally examined the patient, and reviewed labs, radiology and monitor strips.  I personally reviewed and performed the history and exam. I agree with the assessment, plan and orders as documented by the NP.   Rolan Bucco        UB / nmt-th   DD:  06/03/2013   12:50 P    DT:  06/03/2013 02:35 P   [id number]    [id number]   CC:   Rolan Bucco, M.D.         Mliss Sax, M.D.   Faxed to [facility] [employer] [facility] - 06/04/13 at 8:40 a.m. [phone]

## 2013-06-04 LAB — HEMOGLOBIN AND HEMATOCRIT
Hematocrit: 32.9 % — ABNORMAL LOW (ref 34.0–48.0)
Hemoglobin: 10.5 g/dL — ABNORMAL LOW (ref 11.5–15.5)

## 2013-06-04 LAB — PREPARE RBC (CROSSMATCH)

## 2013-06-05 LAB — CBC
Hematocrit: 32.8 % — ABNORMAL LOW (ref 34.0–48.0)
Hemoglobin: 10.4 g/dL — ABNORMAL LOW (ref 11.5–15.5)
MCH: 27.4 pg (ref 26.0–35.0)
MCHC: 31.8 % — ABNORMAL LOW (ref 32.0–34.5)
MCV: 86.1 fL (ref 80.0–99.9)
MPV: 9.2 fL (ref 7.0–12.0)
Platelets: 169 E9/L (ref 130–450)
RBC: 3.81 E12/L (ref 3.50–5.50)
RDW: 20.3 fL — ABNORMAL HIGH (ref 11.5–15.0)
WBC: 10.8 E9/L (ref 4.5–11.5)

## 2013-06-05 LAB — BASIC METABOLIC PANEL
BUN: 13 mg/dL (ref 8–23)
CO2: 39 mmol/L — ABNORMAL HIGH (ref 22–29)
Calcium: 7.4 mg/dL — ABNORMAL LOW (ref 8.6–10.2)
Chloride: 103 mmol/L (ref 98–107)
Creatinine: 0.5 mg/dL (ref 0.5–1.0)
GFR African American: >60
GFR Non-African American: >60 mL/min/{1.73_m2} (ref >=60)
Glucose: 108 mg/dL (ref 74–109)
Potassium: 3.4 mmol/L — ABNORMAL LOW (ref 3.5–5.0)
Sodium: 143 mmol/L (ref 132–146)

## 2013-06-05 LAB — PROTIME-INR
INR: 1.5
Protime: 16.9 s — ABNORMAL HIGH (ref 9.3–12.1)

## 2013-06-05 LAB — EOSINOPHIL SMEAR, URINE: Eosinophil, Urine: 0 % (ref 0–1)

## 2013-06-06 LAB — CBC WITH AUTO DIFFERENTIAL
Anisocytosis: 2
Basophils %: 0 % (ref 0–2)
Basophils Absolute: 0 E9/L (ref 0.00–0.20)
Eosinophils %: 1 % (ref 0–6)
Eosinophils Absolute: 0.1 E9/L (ref 0.05–0.50)
Hematocrit: 32.3 % — ABNORMAL LOW (ref 34.0–48.0)
Hemoglobin: 10 g/dL — ABNORMAL LOW (ref 11.5–15.5)
Lymphocytes %: 11 % — ABNORMAL LOW (ref 20–42)
Lymphocytes Absolute: 1.2 E9/L — ABNORMAL LOW (ref 1.50–4.00)
MCH: 26.8 pg (ref 26.0–35.0)
MCHC: 30.8 % — ABNORMAL LOW (ref 32.0–34.5)
MCV: 86.8 fL (ref 80.0–99.9)
MPV: 9.1 fL (ref 7.0–12.0)
Monocytes %: 5 % (ref 2–12)
Monocytes Absolute: 0.5 E9/L (ref 0.10–0.95)
Neutrophils %: 83 % — ABNORMAL HIGH (ref 43–80)
Neutrophils Absolute: 8.7 E9/L — ABNORMAL HIGH (ref 1.80–7.30)
PLATELET SLIDE REVIEW: NORMAL
Platelets: 179 E9/L (ref 130–450)
RBC: 3.72 E12/L (ref 3.50–5.50)
RDW: 20.2 fL — ABNORMAL HIGH (ref 11.5–15.0)
WBC: 10.5 E9/L (ref 4.5–11.5)

## 2013-06-06 LAB — PROTIME-INR
INR: 1.2
Protime: 13.2 s — ABNORMAL HIGH (ref 9.3–12.1)

## 2013-06-07 LAB — BASIC METABOLIC PANEL
BUN: 11 mg/dL (ref 8–23)
CO2: 43 mmol/L (ref 22–29)
Calcium: 7.6 mg/dL — ABNORMAL LOW (ref 8.6–10.2)
Chloride: 100 mmol/L (ref 98–107)
Creatinine: 0.5 mg/dL (ref 0.5–1.0)
GFR African American: >60
GFR Non-African American: >60 mL/min/{1.73_m2} (ref >=60)
Glucose: 117 mg/dL — ABNORMAL HIGH (ref 74–109)
Potassium: 4 mmol/L (ref 3.5–5.0)
Sodium: 142 mmol/L (ref 132–146)

## 2013-06-07 LAB — PROTIME-INR
INR: 1.1
Protime: 13.1 s — ABNORMAL HIGH (ref 9.3–12.1)

## 2013-06-08 LAB — VANCOMYCIN LEVEL, TROUGH: Vancomycin Tr: 10.9 ug/mL (ref 5.0–16.0)

## 2013-06-08 LAB — PROTIME-INR
INR: 1
Protime: 11.9 s (ref 9.3–12.1)

## 2013-06-08 LAB — ANTISTREPTOLYSIN O TITER: ASO: 323 IU/mL — ABNORMAL HIGH (ref 0–200)

## 2013-06-09 LAB — CULTURE, BLOOD 2: Culture, Blood 2: 5

## 2013-06-09 LAB — IGE: IgE: 118 IU/mL (ref 2–214)

## 2013-06-09 LAB — BASIC METABOLIC PANEL
BUN: 9 mg/dL (ref 8–23)
CO2: 41 mmol/L (ref 22–29)
Calcium: 7.4 mg/dL — ABNORMAL LOW (ref 8.6–10.2)
Chloride: 100 mmol/L (ref 98–107)
Creatinine: 0.4 mg/dL — ABNORMAL LOW (ref 0.5–1.0)
GFR African American: >60
GFR Non-African American: >60 mL/min/{1.73_m2} (ref >=60)
Glucose: 85 mg/dL (ref 74–109)
Potassium: 3.6 mmol/L (ref 3.5–5.0)
Sodium: 142 mmol/L (ref 132–146)

## 2013-06-09 LAB — MAGNESIUM: Magnesium: 1.6 mg/dL (ref 1.6–2.6)

## 2013-06-09 LAB — CULTURE BLOOD #1: Blood Culture, Routine: 5

## 2013-06-09 LAB — PROTIME-INR
INR: 1.1
Protime: 12.9 s — ABNORMAL HIGH (ref 9.3–12.1)

## 2013-06-10 ENCOUNTER — Encounter

## 2013-06-10 LAB — CULTURE, TIP: Culture Catheter Tip: <15

## 2013-06-10 LAB — PROTIME-INR
INR: 1.2
Protime: 13.6 s — ABNORMAL HIGH (ref 9.3–12.1)

## 2013-06-10 NOTE — Progress Notes (Signed)
Speech Language Pathology      NAME:  Bridget Aguilar  DOB:  1935/10/25  DATE: 06/10/2013  ROOM:  Room/bed info not found  There is no problem list on file for this patient.    Patient seen for swallow therapy 15 minutes.   patient educated regarding results of MBS. Reviewed current solid/liquid consistency diet recommendation for Puree consistency solids (level 1) and nectar consistency liquids with trials of Mechanical soft ground consistency solids (level 2) and thin consistency liquids with SLP, and discussed compensatory strategies to ensure safe PO intake. Reviewed aspiration precautions. patient indicated understanding of all information provided via satisfactory verbal response.    Tonette Lederer MSCCC/SLP  [phone]

## 2013-06-10 NOTE — Progress Notes (Signed)
SPEECH LANGUAGE PATHOLOGY     VIDEOFLUOROSCOPIC STUDY OF SWALLOWING (MBSS)  NAME:  Bridget Aguilar  DOB:  11-09-1935  ROOM:  Room/bed info not found DATE: 06/10/2013    [x]  The admitting diagnosis and active problem list as listed below have been reviewed prior to the initiation of this evaluation.    Dysphagia [787.20]  There is no problem list on file for this patient.      DYSPHAGIA DIAGNOSIS:  Mild to moderate pharyngeal dysphagia     DIET RECOMMENDATIONS:  Puree and nectar thick with trials of thin and mech soft with SLP    THERAPY RECOMMENDATIONS:    []  Repeat swallow study in      [x]  Therapy is recommended to:      []  Improve oral motor strength and range of motion    []  Improve tongue base retraction    [x]  Improve laryngeal strength and range of motion   [x]  Provide Deep Pharyngeal Neuromuscular Stimulation (DPNS)   []  Address cricopharyngeal dysfunction (Shaker Exercises)    [x]   Provide ongoing meal endurance analysis to monitor pt's tolerance for prescribed diet and provide safe swallowing compensatory strategies as appropriate.     [x] Other: advance diet when patient demonstrates ability to safely tolerate without  Fatigue compromising integrity of swallow   []  Therapy is not recommended    FEEDING RECOMMENDATIONS:    [] No assistance required   [x] Stand by assist    [] Full assistance required  [] Set up required    [] Supervision with all PO intake     [] Double swallow    [] Chin tuck   [] Multiple swallow     [x] Small bites/sips      [] Alternate solids / liquids  [] Check for oral pocketing   [x] No straw    [] Throat clear       [] Spoon sip liquids   [] Effortful swallow   [x]   Liquids via cup administration     []  Bascom Levels water protocol  []  Modified Frazier water protocol     []    Medication administration recommendations:  []  Administer meds as per patient preference   [x]  Administer meds:    []  whole  [x]  crushed    With:     []  water [x]  applesauce  []  pudding   []  Administer meds via feeding  tube  ________________________________________________________________    OBJECTIVE ASSESSMENT:    Patient positioning: Seated 70-90??    Viewing Plane(s): LATERAL ONLY    Contrast: commercially prepared, non-standardized barium viscosities; graduated from thin liquid to pudding consistency. Administered via tsp (5 ml boluses), by cup or straw in single and sequentially swallowed boluses as tolerated.     MBSImP ID: [id number]     MBSImP Results:   Lip closure for intraoral bolus containment resulted in no labial escape. Tongue control during bolus hold maintained a cohesive bolus held between tongue to palate seal. Bolus preparation and mastication could not be assessed. No solid was given due to safety concerns or logistical reasons not related to mastication. Bolus transport/lingual motion demonstrated delayed initiation of tongue motion. Oral residue was a trace, lining oral structures.     Initiation of the pharyngeal swallow occurred as the bolus head was at the posterior laryngeal surface of the epiglottis. Soft palate elevation resulted in no bolus between the soft palate and the pharyngeal wall. Laryngeal elevation was decreased, with partial superior movement of the thyroid cartilage/partial approximation of the arytenoids to the epiglottic petiole. Anterior hyoid excursion demonstrated partial anterior movement. Epiglottic  movement resulted in partial inversion. Laryngeal vestibular closure was incomplete, with a narrow column of air/contrast noted within the laryngeal vestibule at the height of the swallow. Pharyngeal stripping wave was present and complete. Pharyngeal contraction could not be determined due to logistical reasons not related to physiologic impairment.     Pharyngoesophageal segment opening was completely distended for complete duration with no obstruction of bolus flow. Tongue base retraction allowed a trace column of contrast or air between the retracted tongue base and the posterior  pharyngeal wall. Pharyngeal residue was a trace within or on pharyngeal structures. Esophageal clearance in the upright position could not be assessed due to logistical reasons not related to physiologic impairment.     COMPONENT Number and Descriptor Scale CURRENT Score and Descriptor   1 Lip Closure (0-4) 0 Resulted in no labial escape.    2 Tongue Control/Bolus Hold (0-3) 0 Maintained a cohesive bolus held between tongue to palate seal.    3 Bolus Prep/Mastication (0-3)  Could not be assessed. No solid was given due to safety concerns or logistical reasons not related to mastication.    4 Bolus Transport/Lingual Motion (0-4) 1 Demonstrated delayed initiation of tongue motion.    5 Oral Residue (0-4) 1 Was a trace, lining oral structures.    6 Initiation of Pharyngeal Swallow (0-4) 2 Occurred as the bolus head was at the posterior laryngeal surface of the epiglottis.    7 Soft Palate Elevation (0-4) 0 Resulted in no bolus between the soft palate and the pharyngeal wall.    8 Laryngeal Elevation (0-3) 1 Was decreased, with partial superior movement of the thyroid cartilage/partial approximation of the arytenoids to the epiglottic petiole.    9 Anterior Hyoid Excursion (0-2) 1 Demonstrated partial anterior movement.    10 Epiglottic Movement (0-2) 1 Resulted in partial inversion.    11 Laryngeal Vestibular Closure (0-2) 1 Was incomplete, with a narrow column of air/contrast noted within the laryngeal vestibule at the height of the swallow.    12 Pharyngeal Stripping Wave (0-2) 0 Was present and complete.    13 Pharyngeal Contraction (0-3)  Could not be determined due to logistical reasons not related to physiologic impairment.    14 Pharyngoesophageal Segment Opening (0-3) 0 Was completely distended for complete duration with no obstruction of bolus flow.    15 Tongue Base Retraction (0-4) 1 Allowed a trace column of contrast or air between the retracted tongue base and the posterior pharyngeal wall.    16  Pharyngeal Residue (0-4) 1 Was a trace within or on pharyngeal structures.    17 Esophageal Clearance (upright) (0-4)  Could not be assessed due to logistical reasons not related to physiologic impairment.      Laryngeal Penetration and Aspiration:  Neither penetration nor aspiration was observed in today's study with Pudding-thick, Honey-thick, Nectar-thick, Thin via tsp sips    Penetration was observed in today's study. Thin Contrast via straw sips entered the airway, remained above the vocal folds, and was ejected from the airway.     Current respiratory status:       []  room air, no obvious distress []  room air, SOB    []  nasal cannula   [x]  O2 mask      []  non-rebreather mask  [x]  tracheostomy      []  BiPAP    []  vent dependent  Oral mechanism examination:    [x]  Adequate lingual/labial strength []  Generalized oral weakness      []  Left labiobuccal  weakness  []  Right labiobuccal weakness     []  Left lingual deviation  []  Right lingual deviation                      []  Oral apraxia                   []  CNT  Dentition:    []  Natural  []  Missing teeth/teeth in poor repair      []  Edentulous  [x]  Dentures    []  Implants   Current nutritional status:      []  Oral    []  NPO  Cognitive status:      []  Intact [x]  Functional  []  Confusion    []  Cognitive deficits  []  Aphasia   Vocal quality prior to PO intake: non vocal did not wear PMV    []  WFL []  Weak  []  Wet    EDUCATION/GOAL PLANNING:       Education completed with:     [x]  patient     []  family    Doctor, general practice (SLP) completed education with the patient/family regarding type of swallowing impairment. Reviewed current solid/liquid consistency diet recommendations and discussed compensatory strategies to ensure safe PO intake. Reviewed aspiration precautions. Encouraged pt and/or family to engage SLP in unstructured Q&A session relative to identified deficit areas. Patient/family indicated understanding of all information provided via satisfactory verbal  response.    Regarding:     [x]  diagnosis     [x]  treatment     [x]  prognosis     [x]  plan of care    [x]  Patient was an active participant in goal planning process.    []  Patient was unable to participate in goal planning process.    []  Goal planning not appropriate at this time as intervention was not recommended.    This plan will be re-evaluated and revised in 1 week   if warranted.     Prognosis for improvements or maintaining present level of function is:       [x]  good          []  fair       []  guarded       []  poor  Tonette Lederer MSCCC/SLP  Speech Language Pathologist  [phone]

## 2013-06-11 LAB — PROTIME-INR
INR: 1.4
Protime: 15.8 s — ABNORMAL HIGH (ref 9.3–12.1)

## 2013-06-12 ENCOUNTER — Encounter

## 2013-06-12 LAB — SEDIMENTATION RATE: Sed Rate: 56 mm/Hr — ABNORMAL HIGH (ref 0–20)

## 2013-06-12 LAB — CULTURE, BLOOD 2

## 2013-06-12 LAB — PROTIME-INR
INR: 1.8
Protime: 21 s — ABNORMAL HIGH (ref 9.3–12.1)

## 2013-06-12 LAB — CULTURE BLOOD #1: Blood Culture, Routine: 5

## 2013-06-12 MED ADMIN — gadoversetamide (OPTIMARK) injection 18 mL: INTRAVENOUS | @ 18:00:00 | NDC 00019117750

## 2013-06-12 MED FILL — OPTIMARK 330.9 MG/ML IV SOLN: 330.9 MG/ML | INTRAVENOUS | Qty: 20

## 2013-06-13 LAB — SEDIMENTATION RATE: Sed Rate: 59 mm/Hr — ABNORMAL HIGH (ref 0–20)

## 2013-06-13 LAB — PROTIME-INR
INR: 1.9
Protime: 22.1 s — ABNORMAL HIGH (ref 9.3–12.1)

## 2013-06-14 LAB — PROTIME-INR
INR: 2.9
Protime: 33.9 s — ABNORMAL HIGH (ref 9.3–12.1)

## 2013-06-14 LAB — CULTURE, VRE

## 2013-06-14 LAB — CULTURE, BLOOD 2: Culture, Blood 2: 5

## 2013-06-15 LAB — COMPREHENSIVE METABOLIC PANEL
ALT: 5 U/L (ref 0–32)
AST: 12 U/L (ref 0–31)
Albumin: <1 g/dL — ABNORMAL LOW (ref 3.5–5.2)
Alkaline Phosphatase: 19 U/L — ABNORMAL LOW (ref 35–104)
BUN: <5 mg/dL — ABNORMAL LOW (ref 8–23)
CO2: 43 mmol/L (ref 22–29)
Calcium: 5.8 mg/dL — CL (ref 8.6–10.2)
Chloride: 100 mmol/L (ref 98–107)
Creatinine: 0.4 mg/dL — ABNORMAL LOW (ref 0.5–1.0)
GFR African American: >60
GFR Non-African American: >60 mL/min/{1.73_m2} (ref >=60)
Glucose: 117 mg/dL — ABNORMAL HIGH (ref 74–109)
Potassium: 4 mmol/L (ref 3.5–5.0)
Sodium: 138 mmol/L (ref 132–146)
Total Bilirubin: 0.3 mg/dL (ref 0.0–1.2)
Total Protein: <2 g/dL — ABNORMAL LOW (ref 6.4–8.3)

## 2013-06-15 LAB — PROTIME-INR
INR: 3.9
Protime: 45.4 s — ABNORMAL HIGH (ref 9.3–12.1)

## 2013-06-15 LAB — CULTURE, BLOOD 2: Culture, Blood 2: 5

## 2013-06-16 LAB — PROTIME-INR
INR: 2.5
Protime: 28.2 s — ABNORMAL HIGH (ref 9.3–12.1)

## 2013-06-17 LAB — COMPREHENSIVE METABOLIC PANEL
ALT: 18 U/L (ref 0–32)
AST: 30 U/L (ref 0–31)
Albumin: 2.3 g/dL — ABNORMAL LOW (ref 3.5–5.2)
Alkaline Phosphatase: 68 U/L (ref 35–104)
BUN: 5 mg/dL — ABNORMAL LOW (ref 8–23)
CO2: 38 mmol/L — ABNORMAL HIGH (ref 22–29)
Calcium: 7.7 mg/dL — ABNORMAL LOW (ref 8.6–10.2)
Chloride: 95 mmol/L — ABNORMAL LOW (ref 98–107)
Creatinine: 0.4 mg/dL — ABNORMAL LOW (ref 0.5–1.0)
GFR African American: >60
GFR Non-African American: >60 mL/min/{1.73_m2} (ref >=60)
Glucose: 91 mg/dL (ref 74–109)
Potassium: 3.8 mmol/L (ref 3.5–5.0)
Sodium: 138 mmol/L (ref 132–146)
Total Bilirubin: 0.3 mg/dL (ref 0.0–1.2)
Total Protein: 5.7 g/dL — ABNORMAL LOW (ref 6.4–8.3)

## 2013-06-17 LAB — CBC
Hematocrit: 29 % — ABNORMAL LOW (ref 34.0–48.0)
Hemoglobin: 9.2 g/dL — ABNORMAL LOW (ref 11.5–15.5)
MCH: 27.7 pg (ref 26.0–35.0)
MCHC: 31.6 % — ABNORMAL LOW (ref 32.0–34.5)
MCV: 87.7 fL (ref 80.0–99.9)
MPV: 7.7 fL (ref 7.0–12.0)
Platelets: 245 E9/L (ref 130–450)
RBC: 3.31 E12/L — ABNORMAL LOW (ref 3.50–5.50)
RDW: 20.6 fL — ABNORMAL HIGH (ref 11.5–15.0)
WBC: 9 E9/L (ref 4.5–11.5)

## 2013-06-17 LAB — PROTIME-INR
INR: 1.5
Protime: 16.8 s — ABNORMAL HIGH (ref 9.3–12.1)

## 2013-06-18 LAB — CLOSTRIDIUM DIFFICILE EIA

## 2013-06-18 LAB — PROTIME-INR
INR: 1.3
Protime: 15.4 s — ABNORMAL HIGH (ref 9.3–12.1)

## 2013-06-19 LAB — PROTIME-INR
INR: 1.4
Protime: 16 s — ABNORMAL HIGH (ref 9.3–12.1)

## 2013-06-20 LAB — PROTIME-INR
INR: 1.2
Protime: 14.2 s — ABNORMAL HIGH (ref 9.3–12.1)

## 2013-06-21 LAB — PROTIME-INR
INR: 1.2
Protime: 13.5 s — ABNORMAL HIGH (ref 9.3–12.1)

## 2013-06-22 LAB — PROTIME-INR
INR: 1.1
Protime: 13.1 s — ABNORMAL HIGH (ref 9.3–12.1)

## 2013-06-22 NOTE — Consults (Signed)
KHAYLEE, MCEVOY                               [id number]      DATE OF CONSULTATION:  06/22/2013      CONSULTANT:  Rupert Stacks, M.D.      REASON FOR CONSULTATION:  L1 acute compression fracture and back pain.      HISTORY OF PRESENT ILLNESS:  Tais Koestner is a 77 year old lady who has had a   longstanding history of chronic back pain.  She actually underwent spine   surgery in [place] and did not get significant relief of her pain after her   surgery.  Her pain was actually just as bad as it had been prior to surgery.   However, she has continued to have back pain.  She was recently at [facility] for   respiratory failure and COPD with pseudomonas pneumonia, and she was   subsequently trach'd and transferred to [facility] in [place].  While here, she has continued to have back pain   that is described as a sharp, stabbing and shooting pain.  She states that   the pain is in her thoracic and lumbar region and also radiates into her   legs.  On average the pain is about a 7/10 to 8/10.  She denies any new   numbness or tingling.  Admits to weakness in her bilateral upper and lower   extremities.      PAST MEDICAL HISTORY:  Her past medical history is positive for COPD, sleep   apnea, diabetes, Addison disease, gastroesophageal reflux, DVT, and also C.   difficile.      PAST SURGICAL HISTORY:  Past surgical history is positive for bilateral hip   surgery and left shoulder surgery, thyroidectomy.      FAMILY HISTORY:  Family history is positive for cancer in her mother and   coronary artery disease in her brother.      ALLERGIES:  Allergies include DIAZEPAM, PRECEDEX, PENICILLIN and IVP DYE.      MEDICATIONS:  Medications include Lopressor, potassium, levothyroxine,   Coumadin, Mucomyst, DuoNeb, vitamin C, vitamin D, Cortef, Senokot, Zoloft,   and now Levemir.      SOCIAL HISTORY:  She is an ex-smoker.      REVIEW OF SYSTEMS:  HEENT review of systems is negative for headache, double    vision, blurry vision.  Cardiovascular review of systems:  Negative for chest   pain, arrhythmia, palpitations.  Respiratory review of systems:  Positive for   shortness of breath.  Gastrointestinal review of systems:  Negative for   heartburn, nausea, vomiting, diarrhea or constipation.  Genitourinary review   of systems:  Negative for hematuria or dysuria.  Hematologic review of   systems:  Positive for easy bruising.  Infectious review of systems:   Negative for pneumonia.  Musculoskeletal review of systems:  Positive for   back pain.  Psychiatric review of systems:  Negative for anxiety or   depression.  Neurologic review of systems:  Negative for seizure or stroke.   Endocrine review of systems:  Positive for thyroid disorder and diabetes.      PHYSICAL EXAMINATION:  On physical exam she is currently afebrile.  Vital   signs are stable.  She is resting in a chair, appears to be in mild distress   secondary to pain.  Appears her stated age.  She does also appear to be   mildly obese.  On HEENT exam her head is normocephalic and atraumatic.  Her   pupils are 3-2 mm and reactive.  She has no drainage out of eyes, ears, nose,   but she does have some secretions draining out of her trach site.  On skin   exam the skin is warm and dry.  On musculoskeletal exam she has diffuse   tenderness along both her thoracic and lumbar spine.  In the rest of her   neurologic exam, she is awake and alert.  She is oriented to person and   place.  Cranial nerves 2-12 are intact bilaterally.  Motor exam reveals 4/5   strength in her bilateral upper extremities and 4-/5 strength in her   bilateral lower extremities.  On sensory exam she has decreased light touch   in the left L4 and L5 dermatomal distribution.      LABORATORY DATA/X-RAY STUDIES:  Review of imaging:  She has an MRI of her   lumbar spine that shows that she has multiple insufficiency fractures and an   L1 acute compression fracture.      ASSESSMENT AND RECOMMENDATIONS:   Nithya Meriweather is a 77 year old lady who has a   chronic history of back pain.  She is neurologically stable.  She has an MRI   that shows an L1 acute fracture.  However, an L1 acute compression fracture   would not explain the pain in her legs, and also an L1 acute compression   fracture will not explain _____ pain in the upper thoracic spine.  As a   result of that, I do not feel that this compression fracture is playing a   significant role in her pain and as a result I would not recommend any   intervention for this.  I feel that simple alteration of her pain medication   may be more useful, and it may be worthwhile to place her on some Roxicodone   10 mg q.4 h. p.r.n. pain.  She may also benefit from having Lidoderm patches.               Dictated by:  Halina Andreas, M.D.      Dicie Beam Janace Hoard   DD:  06/22/2013   06:36 P    DT:  06/22/2013 09:35 P   [id number]    [id number]   CC:  Mliss Sax, M.D.        Rupert Stacks, M.D.

## 2013-06-23 LAB — CBC
Hematocrit: 27.4 % — ABNORMAL LOW (ref 34.0–48.0)
Hemoglobin: 8.7 g/dL — ABNORMAL LOW (ref 11.5–15.5)
MCH: 28 pg (ref 26.0–35.0)
MCHC: 31.6 % — ABNORMAL LOW (ref 32.0–34.5)
MCV: 88.5 fL (ref 80.0–99.9)
MPV: 6.8 fL — ABNORMAL LOW (ref 7.0–12.0)
Platelets: 217 E9/L (ref 130–450)
RBC: 3.1 E12/L — ABNORMAL LOW (ref 3.50–5.50)
RDW: 20.3 fL — ABNORMAL HIGH (ref 11.5–15.0)
WBC: 10.4 E9/L (ref 4.5–11.5)

## 2013-06-23 LAB — COMPREHENSIVE METABOLIC PANEL
ALT: 15 U/L (ref 0–32)
AST: 24 U/L (ref 0–31)
Albumin: 2.5 g/dL — ABNORMAL LOW (ref 3.5–5.2)
Alkaline Phosphatase: 64 U/L (ref 35–104)
BUN: 6 mg/dL — ABNORMAL LOW (ref 8–23)
CO2: 37 mmol/L — ABNORMAL HIGH (ref 22–29)
Calcium: 8.2 mg/dL — ABNORMAL LOW (ref 8.6–10.2)
Chloride: 97 mmol/L — ABNORMAL LOW (ref 98–107)
Creatinine: 0.5 mg/dL (ref 0.5–1.0)
GFR African American: >60
GFR Non-African American: >60 mL/min/{1.73_m2} (ref >=60)
Glucose: 115 mg/dL — ABNORMAL HIGH (ref 74–109)
Potassium: 3.8 mmol/L (ref 3.5–5.0)
Sodium: 138 mmol/L (ref 132–146)
Total Bilirubin: 0.3 mg/dL (ref 0.0–1.2)
Total Protein: 6.2 g/dL — ABNORMAL LOW (ref 6.4–8.3)

## 2013-06-23 LAB — PROTIME-INR
INR: 1.3
Protime: 14.5 s — ABNORMAL HIGH (ref 9.3–12.1)

## 2013-06-23 LAB — SEDIMENTATION RATE: Sed Rate: 90 mm/Hr — ABNORMAL HIGH (ref 0–20)

## 2013-06-23 NOTE — Anesthesia Post-Procedure Evaluation (Signed)
Department of Anesthesiology  Post-Anesthesia Note    Name:  Bridget Aguilar                                         Age:  77 y.o.  MRN:  [id number]     Last Vitals:  There were no vitals taken for this visit.  No data found.      Level of Consciousness:  Awake    Respiratory:  Stable    Oxygen Saturation:  Stable    Cardiovascular:  Stable    Hydration:  Adequate    PONV:  Stable    Post-op Pain:  Adequate analgesia    Post-op Assessment:  No apparent anesthetic complications    Additional Follow-Up / Treatment / Comment:  None    Arther Dames, MD  June 23, 2013   7:27 PM

## 2013-06-23 NOTE — Anesthesia Pre-Procedure Evaluation (Signed)
[facility]        Department of Anesthesiology  Pre-Anesthesia Evaluation/Consultation       Name:  CHOUA IKNER                                         Age:  77 y.o.  MRN:  [id number]           Procedure (Scheduled):  EGD  Surgeon:  Dr. Harvie Junior     Allergies not on file  There is no problem list on file for this patient.    No past medical history on file.  No past surgical history on file.  History   Substance Use Topics   ??? Smoking status: Not on file   ??? Smokeless tobacco: Not on file   ??? Alcohol Use: Not on file     Medications  No current outpatient prescriptions on file prior to encounter.     No current facility-administered medications on file prior to encounter.     No current outpatient prescriptions on file.     No current facility-administered medications for this encounter.     Vital Signs (Current) There were no vitals filed for this visit.  Vital Signs Statistics (for past 48 hrs)     No Data Recorded    BP Readings from Last 3 Encounters:   No data found for BP     BMI  There is no height or weight on file to calculate BMI.  There is no height or weight on file to calculate BMI.    CBC   Lab Results   Component Value Date    WBC 10.4 06/23/2013    RBC 3.10 06/23/2013    HGB 8.7 06/23/2013    HCT 27.4 06/23/2013    MCV 88.5 06/23/2013    RDW 20.3 06/23/2013    PLT 217 06/23/2013     CMP    Lab Results   Component Value Date    NA 138 06/23/2013    K 3.8 06/23/2013    CL 97 06/23/2013    CO2 37 06/23/2013    BUN 6 06/23/2013    CREATININE 0.5 06/23/2013    GFRAA >60 06/23/2013    LABGLOM >60 06/23/2013    LABGLOM >60 01/26/2012    GLUCOSE 115 06/23/2013    PROT 6.2 06/23/2013    CALCIUM 8.2 06/23/2013    BILITOT 0.3 06/23/2013    ALKPHOS 64 06/23/2013    AST 24 06/23/2013    ALT 15 06/23/2013     BMP    Lab Results   Component Value Date    NA 138 06/23/2013    K 3.8 06/23/2013    CL 97 06/23/2013    CO2 37 06/23/2013    BUN 6 06/23/2013    CREATININE 0.5 06/23/2013    CALCIUM 8.2 06/23/2013    GFRAA  >60 06/23/2013    LABGLOM >60 06/23/2013    LABGLOM >60 01/26/2012    GLUCOSE 115 06/23/2013     POCGlucose  Recent Labs      06/23/13   0530   GLUCOSE  115*      Coags    Lab Results   Component Value Date    PROTIME 14.5 06/23/2013    INR 1.3 06/23/2013     HCG (If Applicable)   No results found for this basename: PREGTESTUR, PREGSERUM, HCG,  HCGQUANT      ABGs   No results found for this basename: PHART, PO2ART, PCO2ART, HCO3ART, BEART, O2SATART      Type & Screen (If Applicable)  No results found for this basename: LABABO, LABRH     Radiology (If Applicable)  Cardiac Testing (If Applicable)   EKG (If Applicable)    Anesthesia Evaluation     Patient summary reviewed    No history of anesthetic complications   Airway   Mallampati: II  TM distance: >3 FB  Neck ROM: full  Comment: Tracheostomy   Dental    (+) upper dentures and lower dentures    Pulmonary     breath sounds clear to auscultation  (+) pneumonia, COPD, sleep apnea, decreased breath sounds,   ROS comment: Tracheostomy and history of respiratory failure  Cardiovascular   Exercise tolerance: good  (-) hypertension, past MI    ECG reviewed  Rhythm: regular  Rate: normal  ROS comment: History of DVT    Neuro/Psych    (+) neuromuscular disease,   GI/Hepatic/Renal    (+) GERD,   (-) liver disease    Endo/Other    (+) type II diabetes, hypothyroidism    Comments: Addison Disease  Abdominal   (-) obese                  Allergies: Review of patient's allergies indicates not on file.    NPO Status:                              Anesthesia Plan    ASA 4     MAC     intravenous induction   Anesthetic plan and risks discussed with patient.    Plan discussed with CRNA.          Arther Dames, MD  06/23/2013

## 2013-06-23 NOTE — Procedures (Signed)
Bridget Aguilar is a 77 y.o. female patient.  No diagnosis found.  No past medical history on file.  There were no vitals taken for this visit.    Procedures  Procedure:  Esophagogastroduodenoscopy    Indication:  Aspiration, Anemia    Sedation MAC    Endoscope was advanced easily through mouth to second portion of duodenum      Oropharynx views are limited but grossly normal.    Esophagus:   Mucosa is normal. No evidence of fistula's noted upon careful examination of the esophagus. GEJ at 36 cm.      Stomach:   Antrum shows mild gastritis. Biopsies taken for H. Pylori.     Gastric body is normal.    Retroflexed views show normal fundus and cardia.    Duodenum: Bulb is normal.    Second portion of duodenum is normal. Biopsies taken for Celiac.     IMPRESSION AND PLAN:     1.  Erosive Gastritis - biopsies taken for H. Pylori. No fresh or old blood noted in esophagus / stomach / duodenum.     2. Aspiration - no evidence of esophageal fistula noted on endoscopic examination. If concerns for fistula consider esophagram with thin barium. Recommend meals upright instead of laying. Consider inflation of trach balloon during meals to reduce risk of aspiration. Modified barium swallow did not demonstrate any aspiration.     3. Anemia - no fresh or old blood noted. Biopsies for H. Pylori and Celiac pending.       Levy Pupa, DO  06/23/2013  6:59 PM      I was present for entire duration of procedure; discussed findings with resident and agree with recommendations above    Melodie Bouillon MD  Gastroenterology

## 2013-06-24 LAB — PROTIME-INR
INR: 1.3
Protime: 15.2 s — ABNORMAL HIGH (ref 9.3–12.1)

## 2013-06-25 LAB — PROTIME-INR
INR: 1.4
Protime: 15.7 s — ABNORMAL HIGH (ref 9.3–12.1)

## 2013-06-26 LAB — COMPREHENSIVE METABOLIC PANEL
ALT: 18 U/L (ref 0–32)
AST: 28 U/L (ref 0–31)
Albumin: 2.7 g/dL — ABNORMAL LOW (ref 3.5–5.2)
Alkaline Phosphatase: 66 U/L (ref 35–104)
BUN: 5 mg/dL — ABNORMAL LOW (ref 8–23)
CO2: 37 mmol/L — ABNORMAL HIGH (ref 22–29)
Calcium: 8.4 mg/dL — ABNORMAL LOW (ref 8.6–10.2)
Chloride: 97 mmol/L — ABNORMAL LOW (ref 98–107)
Creatinine: 0.4 mg/dL — ABNORMAL LOW (ref 0.5–1.0)
GFR African American: >60
GFR Non-African American: >60 mL/min/{1.73_m2} (ref >=60)
Glucose: 84 mg/dL (ref 74–109)
Potassium: 3.4 mmol/L — ABNORMAL LOW (ref 3.5–5.0)
Sodium: 137 mmol/L (ref 132–146)
Total Bilirubin: 0.4 mg/dL (ref 0.0–1.2)
Total Protein: 6.5 g/dL (ref 6.4–8.3)

## 2013-06-26 LAB — CBC
Hematocrit: 28.6 % — ABNORMAL LOW (ref 34.0–48.0)
Hemoglobin: 9.3 g/dL — ABNORMAL LOW (ref 11.5–15.5)
MCH: 28.9 pg (ref 26.0–35.0)
MCHC: 32.4 % (ref 32.0–34.5)
MCV: 89.2 fL (ref 80.0–99.9)
MPV: 7.2 fL (ref 7.0–12.0)
Platelets: 223 E9/L (ref 130–450)
RBC: 3.21 E12/L — ABNORMAL LOW (ref 3.50–5.50)
RDW: 20.6 fL — ABNORMAL HIGH (ref 11.5–15.0)
WBC: 10.9 E9/L (ref 4.5–11.5)

## 2013-06-26 LAB — PROTIME-INR
INR: 1.3
Protime: 14.8 s — ABNORMAL HIGH (ref 9.3–12.1)

## 2013-06-27 LAB — URINALYSIS
Bilirubin Urine: NEGATIVE
Glucose, Ur: NEGATIVE mg/dL
Nitrite, Urine: NEGATIVE
Protein, UA: 30 mg/dL — AB
Specific Gravity, UA: 1.015 (ref 1.005–1.030)
Urobilinogen, Urine: 0.2 E.U./dL (ref <2.0)
pH, UA: 7 (ref 5.0–9.0)

## 2013-06-27 LAB — MICROSCOPIC URINALYSIS: WBC, UA: >20 /HPF (ref 0–5)

## 2013-06-27 LAB — PROTIME-INR
INR: 1.3
Protime: 14.3 s — ABNORMAL HIGH (ref 9.3–12.1)

## 2013-06-28 LAB — PROTIME-INR
INR: 1.5
Protime: 16.8 s — ABNORMAL HIGH (ref 9.3–12.1)

## 2013-06-28 LAB — CULTURE, URINE

## 2013-06-28 NOTE — Consults (Signed)
Bridget Aguilar, Bridget Aguilar                               [id number]      DATE OF CONSULTATION:  06/28/2013      CONSULTANT:  Loreli Slot, M.D.      REASON FOR CONSULTATION:  Aspiration with need for PEG tube.      HISTORY OF PRESENT ILLNESS:  This is a 77 year old female in [employer]   who has a tracheostomy in place.  She has had chronic respiratory failure,   COPD.  She has had aspiration and food contents coming out from the trachea.   We were consulted for PEG tube after failed swallow evaluation.      PAST MEDICAL HISTORY:  Past medical history includes COPD, chronic   respiratory failure with tracheostomy, pneumonia, cardiopulmonary arrest,   sepsis, obstructive sleep apnea, diabetes, Addison disease, hypothyroidism,   gastroesophageal reflux disease, DVT, C. difficile colitis.      PAST SURGICAL HISTORY:  Left shoulder history, previous PEG tube, back   surgery, IVC filter placement.      FAMILY HISTORY:  Noncontributory.      ALLERGIES:  PENICILLIN, DIAZEPAM, INTRAVENOUS CONTRAST DYE AND APRESODEX.      MEDICATIONS:  Current medications per medication reconciliation sheet.      SOCIAL HISTORY:  History of tobacco use, otherwise noncontributory.      PHYSICAL EXAMINATION:  Vital signs:  Temperature 98.1, heart rate 70, blood   pressure 140/70, respiratory rate 16.  General physical exam:  In no acute   distress.  Resting comfortably in bed.  Chest clear to auscultation   bilaterally.  Cardiovascular:  S1, S2, regular rate and rhythm.  Abdomen:   Soft.  Previous PEG tube site in the left upper quadrant.  Extremities:   Small amount of trace edema.  Musculoskeletal:  Large with decreased tone.   Skin:  Warm without any signs of rash.      LABORATORY DATA/X-RAY STUDIES:  INR was 1.5.      ASSESSMENT:  This is a 77 year old female who needs a PEG tube placement.      RECOMMENDATIONS:  Plan will be to do this in the near future.  Will schedule   with endoscopy and timing to be determined.            Dictated  by:  Loreli Slot, M.D.      Bridget Aguilar / nmt/psk   DD:  06/28/2013   09:55 A    DT:  06/28/2013 12:02 P   [id number]    [id number]   CC:  Mliss Sax, M.D.        Loreli Slot, M.D.

## 2013-06-29 ENCOUNTER — Encounter

## 2013-06-29 LAB — CULTURE, RESPIRATORY: CULTURE, RESPIRATORY: ABSENT — AB

## 2013-06-29 LAB — BASIC METABOLIC PANEL
BUN: 6 mg/dL — ABNORMAL LOW (ref 8–23)
CO2: 32 mmol/L — ABNORMAL HIGH (ref 22–29)
Calcium: 8.2 mg/dL — ABNORMAL LOW (ref 8.6–10.2)
Chloride: 101 mmol/L (ref 98–107)
Creatinine: 0.7 mg/dL (ref 0.5–1.0)
GFR African American: >60
GFR Non-African American: >60 mL/min/{1.73_m2} (ref >=60)
Glucose: 122 mg/dL — ABNORMAL HIGH (ref 74–109)
Potassium: 2.6 mmol/L — CL (ref 3.5–5.0)
Sodium: 139 mmol/L (ref 132–146)

## 2013-06-29 LAB — CBC WITH AUTO DIFFERENTIAL
Basophils %: 0 % (ref 0–2)
Basophils Absolute: 0.03 E9/L (ref 0.00–0.20)
Eosinophils %: 5 % (ref 0–6)
Eosinophils Absolute: 0.52 E9/L — ABNORMAL HIGH (ref 0.05–0.50)
Hematocrit: 25.9 % — ABNORMAL LOW (ref 34.0–48.0)
Hemoglobin: 8.4 g/dL — ABNORMAL LOW (ref 11.5–15.5)
Lymphocytes %: 21 % (ref 20–42)
Lymphocytes Absolute: 2.08 E9/L (ref 1.50–4.00)
MCH: 29 pg (ref 26.0–35.0)
MCHC: 32.4 % (ref 32.0–34.5)
MCV: 89.4 fL (ref 80.0–99.9)
MPV: 7.1 fL (ref 7.0–12.0)
Monocytes %: 9 % (ref 2–12)
Monocytes Absolute: 0.86 E9/L (ref 0.10–0.95)
Neutrophils %: 65 % (ref 43–80)
Neutrophils Absolute: 6.54 E9/L (ref 1.80–7.30)
PLATELET SLIDE REVIEW: NORMAL
Platelets: 210 E9/L (ref 130–450)
RBC: 2.9 E12/L — ABNORMAL LOW (ref 3.50–5.50)
RDW: 20.6 fL — ABNORMAL HIGH (ref 11.5–15.0)
WBC: 10 E9/L (ref 4.5–11.5)

## 2013-06-29 LAB — PROTIME-INR
INR: 2.1
Protime: 24.2 s — ABNORMAL HIGH (ref 9.3–12.1)

## 2013-06-30 LAB — PROTIME-INR
INR: 3.3
Protime: 38.4 s — ABNORMAL HIGH (ref 9.3–12.1)

## 2013-07-01 ENCOUNTER — Encounter

## 2013-07-01 LAB — URINALYSIS
Bilirubin Urine: NEGATIVE
Blood, Urine: NEGATIVE
Glucose, Ur: NEGATIVE mg/dL
Ketones, Urine: NEGATIVE mg/dL
Nitrite, Urine: POSITIVE — AB
Protein, UA: NEGATIVE mg/dL
Specific Gravity, UA: 1.015 (ref 1.005–1.030)
Urobilinogen, Urine: 0.2 E.U./dL (ref <2.0)
pH, UA: 6 (ref 5.0–9.0)

## 2013-07-01 LAB — PROTIME-INR
INR: 3.2
Protime: 37 s — ABNORMAL HIGH (ref 9.3–12.1)

## 2013-07-02 LAB — COMPREHENSIVE METABOLIC PANEL
ALT: 11 U/L (ref 0–32)
AST: 29 U/L (ref 0–31)
Albumin: 2.1 g/dL — ABNORMAL LOW (ref 3.5–5.2)
Alkaline Phosphatase: 64 U/L (ref 35–104)
BUN: 11 mg/dL (ref 8–23)
CO2: 36 mmol/L — ABNORMAL HIGH (ref 22–29)
Calcium: 7.4 mg/dL — ABNORMAL LOW (ref 8.6–10.2)
Chloride: 100 mmol/L (ref 98–107)
Creatinine: 0.6 mg/dL (ref 0.5–1.0)
GFR African American: >60
GFR Non-African American: >60 mL/min/{1.73_m2} (ref >=60)
Glucose: 98 mg/dL (ref 74–109)
Potassium: 3 mmol/L — ABNORMAL LOW (ref 3.5–5.0)
Sodium: 140 mmol/L (ref 132–146)
Total Bilirubin: 0.2 mg/dL (ref 0.0–1.2)
Total Protein: 5.6 g/dL — ABNORMAL LOW (ref 6.4–8.3)

## 2013-07-02 LAB — CBC
Hematocrit: 23.6 % — ABNORMAL LOW (ref 34.0–48.0)
Hemoglobin: 7.5 g/dL — ABNORMAL LOW (ref 11.5–15.5)
MCH: 28.7 pg (ref 26.0–35.0)
MCHC: 31.9 % — ABNORMAL LOW (ref 32.0–34.5)
MCV: 90 fL (ref 80.0–99.9)
MPV: 7.6 fL (ref 7.0–12.0)
Platelets: 236 E9/L (ref 130–450)
RBC: 2.62 E12/L — ABNORMAL LOW (ref 3.50–5.50)
RDW: 22.3 fL — ABNORMAL HIGH (ref 11.5–15.0)
WBC: 9.6 E9/L (ref 4.5–11.5)

## 2013-07-02 LAB — MICROSCOPIC URINALYSIS

## 2013-07-02 LAB — PROTIME-INR
INR: 1.5
Protime: 17.5 s — ABNORMAL HIGH (ref 9.3–12.1)

## 2013-07-03 LAB — CULTURE BLOOD #1
Blood Culture, Routine: 5
Blood Culture, Routine: 5

## 2013-07-03 LAB — TYPE AND SCREEN
ABO/Rh: A NEG
Antibody Screen: NEGATIVE

## 2013-07-03 LAB — PREPARE RBC (CROSSMATCH)

## 2013-07-03 LAB — PROTIME-INR
INR: 1.2
Protime: 14.1 s — ABNORMAL HIGH (ref 9.3–12.1)

## 2013-07-04 LAB — PROTIME-INR
INR: 1.1
Protime: 12.6 s — ABNORMAL HIGH (ref 9.3–12.1)

## 2013-07-04 MED FILL — FENTANYL CITRATE 0.05 MG/ML IJ SOLN: 0.05 MG/ML | INTRAMUSCULAR | Qty: 2

## 2013-07-04 MED FILL — DIPRIVAN 10 MG/ML IV EMUL: 10 MG/ML | INTRAVENOUS | Qty: 20

## 2013-07-04 NOTE — Anesthesia Post-Procedure Evaluation (Signed)
Department of Anesthesiology  Post-Anesthesia Note    Name:  Bridget Aguilar                                         Age:  77 y.o.  MRN:  [id number]     Procedure (Scheduled):  Upper Endoscopy, Insertion of Percutaneous endoscopic gastrostomy Tube.    Surgeon:  Dr. Haskell Flirt.    Last Vitals:  There were no vitals taken for this visit.  No data found.      Last ICU Vitals:       Level of Consciousness:  Awake    Respiratory:  Stable    Oxygen Saturation:  Stable    Cardiovascular:  Stable    Hydration:  Adequate    PONV:  Stable    Post-op Pain:  Adequate analgesia    Post-op Assessment:  No apparent anesthetic complications    Additional Follow-Up / Treatment / Comment:  None    Virgilio Frees, MD  July 04, 2013   1:48 PM

## 2013-07-04 NOTE — Op Note (Signed)
DATE OF PROCEDURE: 07/04/2013     PREOPERATIVE DIAGNOSIS: Failure to take adequate PO    POSTOPERATIVE DIAGNOSIS/FINDINGS: same    SURGEON: Haskell Flirt, M.D.    ASSISTANT: none    OPERATION: 1. Esophagogastroduodenoscopy 2. Percutaneous Endoscopic Gastrostomy (PEG)  placement    ANESTHESIA: Local monitored anesthesia.     COMPLICATIONS: None.     PRIOR TO EXAM: Gen: comfortable, no distress, awake and alert; Lungs: Clear;  Heart:regular rate and rhythm, normal S1S2     BRIEF HISTORY:  This is a 77 y.o. female who presents with the complaint of failure to take adequate PO. My recommendation is to proceed with esophagogastroduodenoscopy/ PEG tube placement. The patient was advised of the risks, benefits, complications and options including the risk of bleeding and perforation. The patient understood and agreed to proceed.    Under [facility] anesthesia, the patient was positioned in the left side down decubitus position. A bite block was inserted. Under direct visualization the scope was passed through the oral cavity, into the esophagus and then into the stomach. The scope was then passed through a normal appearing pylorus into the duodenum. The proximal duodenum and bulb were unremarkable. The scope was pulled back into the stomach and retroflexed. Visualization on the gastroesophageal junction and fundus was unremarkable. The scope was then straightened and the distal stomach was inspected. This showed no evidence of gastritis or ulcer. No biopsies were performed. A good one to one was obtained with the scope in the distal stomach    The area of the abdomen was prepped with iodine. 10 cc1% lidocaine was used for local anesthetic for the skin and subcutaneous tissue.  The 11 blade was used to make a skin incision. The 18 gauge angio cath was used to stick the skin into the stomach. This was performed under direct visualization.  Next the wire was placed through the needle using the seldinger technique. The snare was  used to grasp the wire. The  EGDscope with wire was pulled out through the patient's mouth. The peg tube was then attached to the wire and pulled back into the stomach under direct visualization.  The endocscope confirmed good placement of the peg tube. The peg tube was placed at 3 cm at the skin.  The air was sucked out of the stomach.  An abdominal binder was ordered and the peg tube was placed to gravity drainage.    THE PATIENT TOLERATED THE PROCEDURE.      PEG tube at 3cm at the skin      Haskell Flirt, MD  07/04/2013  1:21 PM

## 2013-07-04 NOTE — Anesthesia Pre-Procedure Evaluation (Signed)
[facility]      Department of Anesthesiology  Pre-Anesthesia Evaluation/Consultation       Name:  Bridget Aguilar                                         Age:  77 y.o.  MRN:  [id number]           Procedure (Scheduled):  Upper Endoscopy, Insertion of Percutaneous endoscopic gastrostomy Tube.    Surgeon:  Dr. Haskell Flirt.     Allergies not on file  There is no problem list on file for this patient.    Past Medical History   Diagnosis Date   ??? Cardiac arrest - ventricular fibrillation    ??? Respiratory failure (HCC)      No past surgical history on file.  History   Substance Use Topics   ??? Smoking status: Not on file   ??? Smokeless tobacco: Not on file   ??? Alcohol Use: Not on file     Medications  No current outpatient prescriptions on file prior to encounter.     No current facility-administered medications on file prior to encounter.     No current outpatient prescriptions on file.     No current facility-administered medications for this encounter.     Vital Signs (Current) There were no vitals filed for this visit.  Vital Signs Statistics (for past 48 hrs)     No Data Recorded    BP Readings from Last 3 Encounters:   No data found for BP     BMI  There is no height or weight on file to calculate BMI.  There is no height or weight on file to calculate BMI.    CBC   Lab Results   Component Value Date    WBC 9.6 07/02/2013    RBC 2.62 07/02/2013    HGB 7.5 07/02/2013    HCT 23.6 07/02/2013    MCV 90.0 07/02/2013    RDW 22.3 07/02/2013    PLT 236 07/02/2013     CMP    Lab Results   Component Value Date    NA 140 07/02/2013    K 3.0 07/02/2013    CL 100 07/02/2013    CO2 36 07/02/2013    BUN 11 07/02/2013    CREATININE 0.6 07/02/2013    GFRAA >60 07/02/2013    LABGLOM >60 07/02/2013    LABGLOM >60 01/26/2012    GLUCOSE 98 07/02/2013    PROT 5.6 07/02/2013    CALCIUM 7.4 07/02/2013    BILITOT 0.2 07/02/2013    ALKPHOS 64 07/02/2013    AST 29 07/02/2013    ALT 11 07/02/2013     BMP    Lab Results   Component Value Date     NA 140 07/02/2013    K 3.0 07/02/2013    CL 100 07/02/2013    CO2 36 07/02/2013    BUN 11 07/02/2013    CREATININE 0.6 07/02/2013    CALCIUM 7.4 07/02/2013    GFRAA >60 07/02/2013    LABGLOM >60 07/02/2013    LABGLOM >60 01/26/2012    GLUCOSE 98 07/02/2013     POCGlucose  Recent Labs      07/02/13   0430   GLUCOSE  98      Coags    Lab Results   Component Value Date  PROTIME 12.6 07/04/2013    INR 1.1 07/04/2013     HCG (If Applicable)   No results found for this basename: PREGTESTUR, PREGSERUM, HCG, HCGQUANT      ABGs   No results found for this basename: PHART, PO2ART, PCO2ART, HCO3ART, BEART, O2SATART      Type & Screen (If Applicable)  Lab Results   Component Value Date    ABORH A NEG 07/02/2013     Radiology (If Applicable)  Cardiac Testing (If Applicable)   EKG (If Applicable)      Anesthesia Evaluation     Patient summary reviewed    Airway   Mallampati: III  TM distance: >3 FB  Neck ROM: full  Dental    (+) upper dentures and lower dentures    Pulmonary     breath sounds clear to auscultation  ROS comment: Respiratory failure, has Trach  Cardiovascular     Rhythm: regular  Rate: normal  ROS comment: H/O Cardiac arrest    Neuro/Psych    GI/Hepatic/Renal      Endo/Other    Abdominal        Other findings: Has a Trach           Allergies: Review of patient's allergies indicates not on file.    NPO Status:                              Anesthesia Plan    ASA 4     MAC     intravenous induction   Anesthetic plan and risks discussed with patient.    Plan discussed with CRNA.          Virgilio Frees, MD  07/04/2013

## 2013-07-05 DIAGNOSIS — IMO0002 Reserved for concepts with insufficient information to code with codable children: Secondary | ICD-10-CM | POA: Diagnosis not present

## 2013-07-05 LAB — PROTIME-INR
INR: 1.2
Protime: 13.8 s — ABNORMAL HIGH (ref 9.3–12.1)

## 2013-07-06 ENCOUNTER — Encounter

## 2013-07-06 LAB — PROTIME-INR
INR: 1.2
Protime: 13.6 s — ABNORMAL HIGH (ref 9.3–12.1)

## 2013-07-07 ENCOUNTER — Encounter

## 2013-07-07 LAB — CBC
Hematocrit: 32.7 % — ABNORMAL LOW (ref 34.0–48.0)
Hemoglobin: 10.7 g/dL — ABNORMAL LOW (ref 11.5–15.5)
MCH: 29.1 pg (ref 26.0–35.0)
MCHC: 32.6 % (ref 32.0–34.5)
MCV: 89.2 fL (ref 80.0–99.9)
MPV: 7.4 fL (ref 7.0–12.0)
Platelets: 273 E9/L (ref 130–450)
RBC: 3.67 E12/L (ref 3.50–5.50)
RDW: 20.2 fL — ABNORMAL HIGH (ref 11.5–15.0)
WBC: 7.9 E9/L (ref 4.5–11.5)

## 2013-07-07 LAB — COMPREHENSIVE METABOLIC PANEL
ALT: 11 U/L (ref 0–32)
AST: 36 U/L — ABNORMAL HIGH (ref 0–31)
Albumin: 2.3 g/dL — ABNORMAL LOW (ref 3.5–5.2)
Alkaline Phosphatase: 67 U/L (ref 35–104)
BUN: 10 mg/dL (ref 8–23)
CO2: 40 mmol/L — ABNORMAL HIGH (ref 22–29)
Calcium: 7.5 mg/dL — ABNORMAL LOW (ref 8.6–10.2)
Chloride: 91 mmol/L — ABNORMAL LOW (ref 98–107)
Creatinine: 0.5 mg/dL (ref 0.5–1.0)
GFR African American: >60
GFR Non-African American: >60 mL/min/{1.73_m2} (ref >=60)
Glucose: 138 mg/dL — ABNORMAL HIGH (ref 74–109)
Potassium: 3.7 mmol/L (ref 3.5–5.0)
Sodium: 136 mmol/L (ref 132–146)
Total Bilirubin: 0.3 mg/dL (ref 0.0–1.2)
Total Protein: 6.3 g/dL — ABNORMAL LOW (ref 6.4–8.3)

## 2013-07-07 LAB — PROTIME-INR
INR: 1.2
Protime: 13.5 s — ABNORMAL HIGH (ref 9.3–12.1)

## 2013-07-07 LAB — MAGNESIUM: Magnesium: 1.8 mg/dL (ref 1.6–2.6)

## 2013-07-08 ENCOUNTER — Encounter

## 2013-07-08 LAB — PROTIME-INR
INR: 1.2
Protime: 13.5 s — ABNORMAL HIGH (ref 9.3–12.1)

## 2013-07-09 LAB — COMPREHENSIVE METABOLIC PANEL
ALT: 15 U/L (ref 0–32)
AST: 43 U/L — ABNORMAL HIGH (ref 0–31)
Albumin: 2.4 g/dL — ABNORMAL LOW (ref 3.5–5.2)
Alkaline Phosphatase: 75 U/L (ref 35–104)
BUN: 11 mg/dL (ref 8–23)
CO2: 37 mmol/L — ABNORMAL HIGH (ref 22–29)
Calcium: 8.4 mg/dL — ABNORMAL LOW (ref 8.6–10.2)
Chloride: 92 mmol/L — ABNORMAL LOW (ref 98–107)
Creatinine: 0.4 mg/dL — ABNORMAL LOW (ref 0.5–1.0)
GFR African American: >60
GFR Non-African American: >60 mL/min/{1.73_m2} (ref >=60)
Glucose: 112 mg/dL — ABNORMAL HIGH (ref 74–109)
Potassium: 4.3 mmol/L (ref 3.5–5.0)
Sodium: 134 mmol/L (ref 132–146)
Total Bilirubin: 0.4 mg/dL (ref 0.0–1.2)
Total Protein: 6.5 g/dL (ref 6.4–8.3)

## 2013-07-09 LAB — PROTIME-INR
INR: 1.2
Protime: 14.1 s — ABNORMAL HIGH (ref 9.3–12.1)

## 2013-07-09 LAB — CBC
Hematocrit: 33.7 % — ABNORMAL LOW (ref 34.0–48.0)
Hemoglobin: 11.1 g/dL — ABNORMAL LOW (ref 11.5–15.5)
MCH: 29.4 pg (ref 26.0–35.0)
MCHC: 33 % (ref 32.0–34.5)
MCV: 89.1 fL (ref 80.0–99.9)
MPV: 7.5 fL (ref 7.0–12.0)
Platelets: 269 E9/L (ref 130–450)
RBC: 3.78 E12/L (ref 3.50–5.50)
RDW: 20.4 fL — ABNORMAL HIGH (ref 11.5–15.0)
WBC: 11.6 E9/L — ABNORMAL HIGH (ref 4.5–11.5)

## 2013-07-10 LAB — CBC
Hematocrit: 31.9 % — ABNORMAL LOW (ref 34.0–48.0)
Hemoglobin: 10.4 g/dL — ABNORMAL LOW (ref 11.5–15.5)
MCH: 29.1 pg (ref 26.0–35.0)
MCHC: 32.5 % (ref 32.0–34.5)
MCV: 89.4 fL (ref 80.0–99.9)
MPV: 7.7 fL (ref 7.0–12.0)
Platelets: 263 E9/L (ref 130–450)
RBC: 3.56 E12/L (ref 3.50–5.50)
RDW: 19.9 fL — ABNORMAL HIGH (ref 11.5–15.0)
WBC: 10.6 E9/L (ref 4.5–11.5)

## 2013-07-10 LAB — BASIC METABOLIC PANEL
BUN: 9 mg/dL (ref 8–23)
CO2: 37 mmol/L — ABNORMAL HIGH (ref 22–29)
Calcium: 8.4 mg/dL — ABNORMAL LOW (ref 8.6–10.2)
Chloride: 96 mmol/L — ABNORMAL LOW (ref 98–107)
Creatinine: 0.4 mg/dL — ABNORMAL LOW (ref 0.5–1.0)
GFR African American: >60
GFR Non-African American: >60 mL/min/{1.73_m2} (ref >=60)
Glucose: 102 mg/dL (ref 74–109)
Potassium: 4.1 mmol/L (ref 3.5–5.0)
Sodium: 137 mmol/L (ref 132–146)

## 2013-07-10 LAB — PROTIME-INR
INR: 1.4
Protime: 16.2 s — ABNORMAL HIGH (ref 9.3–12.1)

## 2013-07-11 ENCOUNTER — Encounter

## 2013-07-11 LAB — PROTIME-INR
INR: 1.6
Protime: 17.8 s — ABNORMAL HIGH (ref 9.3–12.1)

## 2013-07-11 LAB — CLOSTRIDIUM DIFFICILE EIA

## 2013-07-12 LAB — PROTIME-INR
INR: 1.8
Protime: 20.7 s — ABNORMAL HIGH (ref 9.3–12.1)

## 2013-07-13 LAB — PROTIME-INR
INR: 2.1
Protime: 24.6 s — ABNORMAL HIGH (ref 9.3–12.1)

## 2013-07-13 LAB — CULTURE BLOOD #1: Blood Culture, Routine: 5

## 2013-07-13 LAB — CULTURE, BLOOD 2: Culture, Blood 2: 5

## 2013-07-14 ENCOUNTER — Encounter (INDEPENDENT_AMBULATORY_CARE_PROVIDER_SITE_OTHER): Payer: Self-pay

## 2013-07-18 DIAGNOSIS — I10 Essential (primary) hypertension: Secondary | ICD-10-CM | POA: Diagnosis not present

## 2013-07-18 DIAGNOSIS — E7800 Pure hypercholesterolemia, unspecified: Secondary | ICD-10-CM | POA: Diagnosis not present

## 2013-07-18 DIAGNOSIS — E039 Hypothyroidism, unspecified: Secondary | ICD-10-CM | POA: Diagnosis not present

## 2013-07-18 DIAGNOSIS — F17200 Nicotine dependence, unspecified, uncomplicated: Secondary | ICD-10-CM | POA: Diagnosis not present

## 2013-07-25 DIAGNOSIS — E7800 Pure hypercholesterolemia, unspecified: Secondary | ICD-10-CM | POA: Diagnosis not present

## 2013-07-25 DIAGNOSIS — E039 Hypothyroidism, unspecified: Secondary | ICD-10-CM | POA: Diagnosis not present

## 2013-07-25 DIAGNOSIS — Z23 Encounter for immunization: Secondary | ICD-10-CM | POA: Diagnosis not present

## 2013-07-25 DIAGNOSIS — F320 Major depressive disorder, single episode, mild: Secondary | ICD-10-CM | POA: Diagnosis not present

## 2013-07-25 DIAGNOSIS — F17200 Nicotine dependence, unspecified, uncomplicated: Secondary | ICD-10-CM | POA: Diagnosis not present

## 2013-09-06 ENCOUNTER — Encounter (INDEPENDENT_AMBULATORY_CARE_PROVIDER_SITE_OTHER): Payer: Self-pay | Admitting: *Deleted

## 2013-09-06 DIAGNOSIS — K1120 Sialoadenitis, unspecified: Secondary | ICD-10-CM | POA: Diagnosis not present

## 2013-09-06 DIAGNOSIS — R5383 Other fatigue: Secondary | ICD-10-CM | POA: Diagnosis not present

## 2013-09-06 DIAGNOSIS — R5381 Other malaise: Secondary | ICD-10-CM | POA: Diagnosis not present

## 2013-09-07 LAB — CBC
Hematocrit: 31.4 % — ABNORMAL LOW (ref 34.0–48.0)
Hemoglobin: 10.1 g/dL — ABNORMAL LOW (ref 11.5–15.5)
MCH: 28.7 pg (ref 26.0–35.0)
MCHC: 32 % (ref 32.0–34.5)
MCV: 89.5 fL (ref 80.0–99.9)
MPV: 7.8 fL (ref 7.0–12.0)
Platelets: 383 E9/L (ref 130–450)
RBC: 3.51 E12/L (ref 3.50–5.50)
RDW: 14.9 fL (ref 11.5–15.0)
WBC: 15.9 E9/L — ABNORMAL HIGH (ref 4.5–11.5)

## 2013-09-07 LAB — PROTIME-INR
INR: 4.9
Protime: 57.9 s — ABNORMAL HIGH (ref 9.6–12.7)

## 2013-09-09 DIAGNOSIS — R05 Cough: Secondary | ICD-10-CM | POA: Diagnosis not present

## 2013-09-09 DIAGNOSIS — R059 Cough, unspecified: Secondary | ICD-10-CM | POA: Diagnosis not present

## 2013-09-09 DIAGNOSIS — J40 Bronchitis, not specified as acute or chronic: Secondary | ICD-10-CM

## 2013-09-09 HISTORY — DX: Bronchitis, not specified as acute or chronic: J40

## 2013-09-24 ENCOUNTER — Inpatient Hospital Stay
Admit: 2013-09-24 | Discharge: 2013-09-24 | Disposition: A | Discharge status: Home / Self Care | Attending: Emergency Medicine

## 2013-09-24 LAB — CBC WITH AUTO DIFFERENTIAL
Basophils %: 1 % (ref 0–2)
Basophils Absolute: 0.1 E9/L (ref 0.00–0.20)
Eosinophils %: 5 % (ref 0–6)
Eosinophils Absolute: 0.4 E9/L (ref 0.05–0.50)
Hematocrit: 31.9 % — ABNORMAL LOW (ref 34.0–48.0)
Hemoglobin: 10.4 g/dL — ABNORMAL LOW (ref 11.5–15.5)
Lymphocytes %: 18 % — ABNORMAL LOW (ref 20–42)
Lymphocytes Absolute: 1.7 E9/L (ref 1.50–4.00)
MCH: 29.2 pg (ref 26.0–35.0)
MCHC: 32.5 % (ref 32.0–34.5)
MCV: 90 fL (ref 80.0–99.9)
MPV: 7.5 fL (ref 7.0–12.0)
Monocytes %: 6 % (ref 2–12)
Monocytes Absolute: 0.6 E9/L (ref 0.10–0.95)
Neutrophils %: 70 % (ref 43–80)
Neutrophils Absolute: 6.3 E9/L (ref 1.80–7.30)
Platelets: 416 E9/L (ref 130–450)
RBC: 3.54 E12/L (ref 3.50–5.50)
RDW: 15.8 fL — ABNORMAL HIGH (ref 11.5–15.0)
WBC: 9.1 E9/L (ref 4.5–11.5)

## 2013-09-24 LAB — EKG 12-LEAD
ECG Heart Rate: 98 /min
ECG P Duration: 110 ms
ECG QRS Axis: -33 deg
ECG QRS Duration: 82 ms
ECG QT Dispersion: 44 ms
ECG QTC Interval: 465 ms
ECG RR Interval: 612 ms
P Axis: 46 deg
P-R Interval: 170 ms
Q-T Interval: 364 ms
T Axis: 45 deg

## 2013-09-24 LAB — BASIC METABOLIC PANEL
Anion Gap: 9 mmol/L (ref 7–16)
BUN: 6 mg/dL — ABNORMAL LOW (ref 8–23)
CO2: 34 mmol/L — ABNORMAL HIGH (ref 22–29)
Calcium: 9.2 mg/dL (ref 8.6–10.2)
Chloride: 94 mmol/L — ABNORMAL LOW (ref 98–107)
Creatinine: 0.6 mg/dL (ref 0.5–1.0)
GFR African American: >60
GFR Non-African American: >60 mL/min/{1.73_m2} (ref >=60)
Glucose: 152 mg/dL — ABNORMAL HIGH (ref 74–109)
Potassium: 3 mmol/L — ABNORMAL LOW (ref 3.5–5.0)
Sodium: 137 mmol/L (ref 132–146)

## 2013-09-24 LAB — APTT: aPTT: 32.6 s (ref 24.0–35.0)

## 2013-09-24 LAB — CK-MB: CK-MB: 1.5 ng/mL (ref 0.0–3.8)

## 2013-09-24 LAB — POC BNP: BNP Whole Blood: 15 pg/mL (ref 0–99)

## 2013-09-24 LAB — CK: Total CK: 28 U/L (ref 20–180)

## 2013-09-24 LAB — PROTIME-INR
INR: 2.1
Protime: 24.2 s — ABNORMAL HIGH (ref 9.6–12.7)

## 2013-09-24 LAB — TROPONIN: Troponin: 0.02 ng/mL (ref 0.00–0.03)

## 2013-09-24 MED ADMIN — sodium chloride flush 0.9 % injection 10 mL: 10 mL | INTRAVENOUS | @ 15:00:00

## 2013-09-24 MED ADMIN — aspirin chewable tablet 324 mg: 324 mg | ORAL | @ 14:00:00 | NDC 63739043401

## 2013-09-24 MED ADMIN — levofloxacin (LEVAQUIN) 750 MG/150ML infusion 750 mg: 750 mg | INTRAVENOUS | @ 17:00:00 | NDC 25021013268

## 2013-09-24 MED ADMIN — ondansetron (ZOFRAN) injection 4 mg: 4 mg | INTRAVENOUS | @ 15:00:00 | NDC 23155037831

## 2013-09-24 MED ADMIN — nitroGLYCERIN (NITROSTAT) SL tablet 0.4 mg: 0.4 mg | SUBLINGUAL | @ 14:00:00 | NDC 00071041813

## 2013-09-24 MED ADMIN — fentaNYL (SUBLIMAZE) injection 50 mcg: 50 ug | INTRAVENOUS | @ 15:00:00 | NDC 00409909422

## 2013-09-24 MED ADMIN — potassium chloride (KAYCIEL) 20 MEQ/15ML (10%) solution 40 mEq: 40 meq | ORAL | @ 15:00:00 | NDC 00121146530

## 2013-09-24 MED ADMIN — vancomycin 1000 mg IVPB in 250 mL D5W addavial: 1000 mg | INTRAVENOUS | @ 16:00:00 | NDC 63323028420

## 2013-09-24 MED ADMIN — ondansetron (ZOFRAN) injection 4 mg: 4 mg | INTRAVENOUS | @ 14:00:00 | NDC 23155037831

## 2013-09-24 MED ADMIN — fentaNYL (SUBLIMAZE) injection 50 mcg: 50 ug | INTRAVENOUS | @ 17:00:00 | NDC 00409909422

## 2013-09-24 MED FILL — CHILDRENS ASPIRIN 81 MG PO CHEW: 81 MG | ORAL | Qty: 4

## 2013-09-24 MED FILL — ONDANSETRON HCL 4 MG/2ML IJ SOLN: 4 MG/2ML | INTRAMUSCULAR | Qty: 2

## 2013-09-24 MED FILL — MORPHINE SULFATE (PF) 4 MG/ML IV SOLN: 4 MG/ML | INTRAVENOUS | Qty: 1

## 2013-09-24 MED FILL — LEVAQUIN 750 MG/150ML IV SOLN: 750 MG/150ML | INTRAVENOUS | Qty: 150

## 2013-09-24 MED FILL — NORMAL SALINE FLUSH 0.9 % IV SOLN: 0.9 % | INTRAVENOUS | Qty: 10

## 2013-09-24 MED FILL — POTASSIUM CHLORIDE 20 MEQ/15ML (10%) PO SOLN: 20 MEQ/15ML (10%) | ORAL | Qty: 30

## 2013-09-24 MED FILL — FENTANYL CITRATE 0.05 MG/ML IJ SOLN: 0.05 MG/ML | INTRAMUSCULAR | Qty: 2

## 2013-09-24 MED FILL — VANCOMYCIN HCL 1000 MG IV SOLR: 1000 MG | INTRAVENOUS | Qty: 1

## 2013-09-24 MED FILL — NORMAL SALINE FLUSH 0.9 % IV SOLN: 0.9 % | INTRAVENOUS | Qty: 30

## 2013-09-24 MED FILL — NITROSTAT 0.4 MG SL SUBL: 0.4 MG | SUBLINGUAL | Qty: 25

## 2013-09-24 NOTE — ED Notes (Signed)
[facility] HUSBAND NOTIFIED OF PATIENT ADMISSION BY [facility] NURSE.    Ernest MallickBecky Rathana Viveros, RN  09/24/13 1112

## 2013-09-24 NOTE — ED Notes (Signed)
Supervisor Quarry [facility]Tina [facility] notified of need [facility] - will call back with bed assignment.    Ernest MallickBecky Ruston Fedora, RN  09/24/13 1048

## 2013-09-24 NOTE — ED Notes (Signed)
[facility] here to transfer [facility].    Ernest MallickBecky Loyola Santino, RN  09/24/13 [phone]

## 2013-09-24 NOTE — ED Notes (Signed)
[facility] to [facility] per Inetta Fermoina, [facility].    Bridget MallickBecky Baker Kogler, RN  09/24/13 1059

## 2013-09-24 NOTE — ED Notes (Signed)
Patient has a dressing to the right lower leg and she states she has an open wound to the outside of the lef. Dressing clean dry and intact at this time.    Ernest MallickBecky Casy Brunetto, RN  09/24/13 1155

## 2013-09-24 NOTE — ED Notes (Signed)
Patient suctioned for a large amount of white mucus. Has had a cough per patient.    Ernest MallickBecky Fate Caster, RN  09/24/13 [phone]

## 2013-09-24 NOTE — ED Notes (Signed)
REPORT CALL[facility] TO JUL[facility].    Ernest MallickBecky Bricia Taher, RN  09/24/13 1339

## 2013-09-24 NOTE — ED Notes (Signed)
[facility] dispatch called to say the ambulance is in route but due to the weather it will coming later then expected when called.    Almond Lint, RN  09/24/13 1225

## 2013-09-24 NOTE — ED Notes (Signed)
Pt's family called in and stated that [facility] and pt is not there yet, this [facility] and they state that pt arrived to [place] at 1415 to the icu, called icu and verified that pt is there and they state that pt is in the icu. Returned call to pt's family and notified them that [facility].    Stevphen RochesterLaurie Susan Inman, RN  09/24/13 [phone]

## 2013-09-24 NOTE — ED Provider Notes (Addendum)
HPI Comments: Patient is a 78 year old female presenting from [facility] nursing home with chest pain.  Patient states that she developed chest pain approximately 2 hours ago and is in her mid chest radiating down the left arm.  She states that she has never had pain like this before.  She denies any pain in her back.  She does admit to some nausea and has had 2 episodes of vomiting since presenting to the emergency department.  She states that she did have a previous heart cath but no stent placement.  She is not on aspirin.  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  She is unsure of what her last INR was.  It is noted in the patient's file that she did have a recent cardiac arrest with respiratory failure requiring tracheostomy placement.    Patient is a 78 y.o. female presenting with chest pain. The history is provided by the patient and the nursing home. No language interpreter was used.   Chest Pain  Pain location:  Substernal area  Pain quality: tightness    Pain radiates to:  L arm  Pain radiates to the back: no    Pain severity:  Severe  Onset quality:  Gradual  Duration:  2 hours  Timing:  Constant  Progression:  Unchanged  Chronicity:  New  Context: at rest    Relieved by:  None tried  Worsened by:  Nothing tried  Ineffective treatments:  None tried  Associated symptoms: nausea and vomiting    Associated symptoms: no abdominal pain, no altered mental status, no anorexia, no anxiety, no back pain, no claudication, no cough, no diaphoresis, no dizziness, no fever and no shortness of breath    Risk factors: diabetes mellitus, high cholesterol, hypertension, immobilization, obesity and prior DVT/PE        Review of Systems   Unable to perform ROS: Other   Constitutional: Negative for fever and diaphoresis.   Respiratory: Negative for cough, chest tightness and shortness of breath.    Cardiovascular: Positive for chest pain. Negative for claudication.   Gastrointestinal:  Positive for nausea and vomiting. Negative for abdominal pain and anorexia.   Musculoskeletal: Negative for back pain.   Neurological: Negative for dizziness.       Physical Exam   Constitutional: She is oriented to person, place, and time. She appears well-developed and well-nourished. No distress.   HENT:   Head: Normocephalic and atraumatic.   Mouth/Throat: Oropharynx is clear and moist.   Eyes: Conjunctivae and EOM are normal. Pupils are equal, round, and reactive to light.   Neck: Normal range of motion. Neck supple.   Tracheostomy in place with no surrounding erythema or obvious sign of infection    Cardiovascular: Normal rate, regular rhythm and normal heart sounds.  Exam reveals no gallop and no friction rub.    No murmur heard.  Pulmonary/Chest: Effort normal. No respiratory distress. She has no wheezes. She has no rales.   Coarse breath sounds bilaterally    Abdominal: Soft. Bowel sounds are normal. There is no tenderness. There is no rebound and no guarding.   G tube in place with no signs of infection    Musculoskeletal: Normal range of motion. She exhibits no edema or tenderness.   Lymphadenopathy:     She has no cervical adenopathy.   Neurological: She is alert and oriented to person, place, and time. No cranial nerve deficit. Coordination normal.  Skin: Skin is warm and dry. No rash noted. She is not diaphoretic. No erythema.   Nursing note and vitals reviewed.      Procedures    MDM    EKG Interpretation    Interpreted by emergency department physician    Rhythm: normal sinus   Rate: 98  Axis: left  Ectopy: none  Conduction: left anterior fasciclar block  ST Segments: no acute change  T Waves: no acute change  Q Waves: none    Clinical Impression: Sinus rhythm with no acute ischemic changes.  Mild flattening of the T waves laterally and inferiorly. Left anterior fascicular block.  No acute ST elevations or depressions.  No previous EKG for comparison.    Milinda Cave    Time 9:34 AM  Patient has  had no improvement in her pain after 3 nitros.  States that it is still going into the left arm as well.  Patient is now tachycardic with a heart rate of 115.  Has a history of DVT with a possible filter in place.  Will await results of creatinine and will likely need to CTA patient to rule out PE.  Still complaining of being nauseated as well.  Will give another dose of zofran as well as some morphine to see if she gets any improvement in her pain.     Time 9:38 AM  Patient is allergic to dye and does not remember what her allergy is.  Will likely need a VQ scan.  Awaiting results of coags but will need to be transferred for further work up and management.     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    Past Surgical History:  has past surgical history that includes Hysterectomy and tracheostomy.    Social History:      Family History: family history is not on file.     The patient???s home medications have been reviewed.    Allergies: Diazepam; Dye; Penicillins; and Precedex    -------------------------------------------------- RESULTS -------------------------------------------------    Lab  Results for orders placed during the hospital encounter of 09/24/13    CBC WITH AUTO DIFFERENTIAL       Result Value Range    WBC 9.1  4.5 - 11.5 E9/L    RBC 3.54  3.50 - 5.50 E12/L    Hemoglobin 10.4 (*) 11.5 - 15.5 g/dL    Hematocrit 11.9 (*) 34.0 - 48.0 %    MCV 90.0  80.0 - 99.9 fL    MCH 29.2  26.0 - 35.0 pg    MCHC 32.5  32.0 - 34.5 %    RDW 15.8 (*) 11.5 - 15.0 fL    Platelets 416  130 - 450 E9/L    MPV 7.5  7.0 - 12.0 fL    Neutrophils Relative 70  43 - 80 %    Lymphocytes Relative 18 (*) 20 - 42 %    Monocytes Relative 6  2 - 12 %    Eosinophils Relative Percent 5  0 - 6 %  Basophils Relative 1  0 - 2 %    Neutrophils Absolute 6.30  1.80 - 7.30 E9/L    Lymphocytes Absolute 1.70  1.50 - 4.00 E9/L    Monocytes Absolute 0.60  0.10 - 0.95 E9/L    Eosinophils Absolute 0.40  0.05 - 0.50 E9/L    Basophils Absolute 0.10  0.00 - 0.20 E9/L   BASIC METABOLIC PANEL       Result Value Range    Sodium 137  132 - 146 mmol/L    Potassium 3.0 (*) 3.5 - 5.0 mmol/L    Chloride 94 (*) 98 - 107 mmol/L    CO2 34 (*) 22 - 29 mmol/L    Anion Gap 9  7 - 16 mmol/L    Glucose 152 (*) 74 - 109 mg/dL    BUN 6 (*) 8 - 23 mg/dL    CREATININE 0.6  0.5 - 1.0 mg/dL    GFR Non-African American >60  >=60 mL/min/1.73    GFR African American >60      Calcium 9.2  8.6 - 10.2 mg/dL   PROTIME-INR       Result Value Range    Protime 24.2 (*) 9.6 - 12.7 sec    INR 2.1     APTT       Result Value Range    aPTT 32.6  24.0 - 35.0 sec   TROPONIN       Result Value Range    Troponin 0.02  0.00 - 0.03 ng/mL   CK       Result Value Range    Total CK 28  20 - 180 U/L   CK-MB       Result Value Range    CK-MB 1.5  0.0 - 3.8 ng/mL   POC BNP       Result Value Range    BNP Whole Blood 15  0 - 99 pg/mL       Radiology  No results found.        ------------------------- NURSING NOTES AND VITALS REVIEWED ---------------------------  The nursing notes within the ED encounter and vital signs as below have been reviewed.   Patient Vitals for the past 24 hrs:   BP Temp Temp src Pulse Resp SpO2 Height Weight   09/24/13 1006 114/72  mmHg - - 101 16 97 % - -   09/24/13 0942 117/75 mmHg - - 111 18 94 % - -   09/24/13 0927 114/57 mmHg - - 100 18 94 % - -   09/24/13 0919 114/57 mmHg - - 100 20 96 % - -   09/24/13 0854 156/66 mmHg 98.3 ??F (36.8 ??C) Oral 93 - 92 % 4\' 11"  (1.499 m) 170 lb (77.111 kg)       Oxygen Saturation Interpretation: Normal      ------------------------------------------ PROGRESS NOTES ------------------------------------------  Re-evaluation(s):  Time 10:00 AM  Patient's symptoms show no change.    Time 10:15 AM  Possible infiltrate on chest xray which is more likely pleural effusion however will treat with antibiotics as she is from a nursing home and has had pneumonia multiple times in the past.    I have spoken with the patient and discussed today???s results, in addition to providing specific details for the plan of care and counseling regarding the diagnosis and prognosis.  Their questions are answered at this time and they are agreeable with the plan.      --------------------------------- ADDITIONAL PROVIDER NOTES ---------------------------------  Consultations:  Time 9:46 AM  Spoke with Dr. Herbert Pun,  They will accept this patient in transfer.    This patient's ED course included: a personal history and physicial eaxmination, multiple bedside re-evaluations, IV medications, cardiac monitoring and continuous pulse oximetry    This patient has remained hemodynamically stable and been closely monitored during their ED course.    Please note that the withdrawal or failure to initiate urgent interventions for this patient would likely result in a life threatening deterioration or permanent disability.      Accordingly this patient received 30 minutes of critical care time, excluding separately billable procedures.      Clinical Impression  1. Chest pain    2. Hypokalemia    3. Tachycardia, unspecified    4. Nausea and vomiting    5. Pneumonia, organism unspecified    6. Pleural effusion          Disposition  Patient's  disposition: Transfer to [facility]  Patient's condition is stable.      Milinda Cave, DO  Resident  09/24/13 1610        Milinda Cave, DO  Resident  09/24/13 [phone]

## 2013-09-24 NOTE — ED Notes (Signed)
Dr Idell PicklesHELLER SPOKE WITH DR STABILE WHO IS ACCEPTING PATIENT FOR ADMISSION TO [facility].    Ernest MallickBecky Nathian Stencil, RN  09/24/13 [phone]

## 2013-10-03 DIAGNOSIS — F17200 Nicotine dependence, unspecified, uncomplicated: Secondary | ICD-10-CM | POA: Diagnosis not present

## 2013-10-03 DIAGNOSIS — R072 Precordial pain: Secondary | ICD-10-CM | POA: Diagnosis not present

## 2013-10-03 DIAGNOSIS — F411 Generalized anxiety disorder: Secondary | ICD-10-CM | POA: Diagnosis not present

## 2013-10-03 DIAGNOSIS — K2100 Gastro-esophageal reflux disease with esophagitis, without bleeding: Secondary | ICD-10-CM | POA: Diagnosis not present

## 2013-10-03 DIAGNOSIS — E039 Hypothyroidism, unspecified: Secondary | ICD-10-CM | POA: Diagnosis not present

## 2013-10-03 DIAGNOSIS — F419 Anxiety disorder, unspecified: Secondary | ICD-10-CM

## 2013-10-03 DIAGNOSIS — E7800 Pure hypercholesterolemia, unspecified: Secondary | ICD-10-CM | POA: Diagnosis not present

## 2013-10-03 HISTORY — DX: Anxiety disorder, unspecified: F41.9

## 2013-10-18 DIAGNOSIS — E039 Hypothyroidism, unspecified: Secondary | ICD-10-CM | POA: Diagnosis not present

## 2013-10-18 DIAGNOSIS — E7800 Pure hypercholesterolemia, unspecified: Secondary | ICD-10-CM | POA: Diagnosis not present

## 2013-10-18 DIAGNOSIS — I10 Essential (primary) hypertension: Secondary | ICD-10-CM | POA: Diagnosis not present

## 2013-10-18 DIAGNOSIS — F17200 Nicotine dependence, unspecified, uncomplicated: Secondary | ICD-10-CM | POA: Diagnosis not present

## 2013-11-01 DIAGNOSIS — M545 Low back pain, unspecified: Secondary | ICD-10-CM | POA: Diagnosis not present

## 2013-11-01 DIAGNOSIS — S335XXA Sprain of ligaments of lumbar spine, initial encounter: Secondary | ICD-10-CM | POA: Diagnosis not present

## 2013-11-03 DIAGNOSIS — S335XXA Sprain of ligaments of lumbar spine, initial encounter: Secondary | ICD-10-CM | POA: Diagnosis not present

## 2013-11-03 DIAGNOSIS — M545 Low back pain, unspecified: Secondary | ICD-10-CM | POA: Diagnosis not present

## 2013-11-16 DIAGNOSIS — F17200 Nicotine dependence, unspecified, uncomplicated: Secondary | ICD-10-CM | POA: Diagnosis not present

## 2013-11-16 DIAGNOSIS — E039 Hypothyroidism, unspecified: Secondary | ICD-10-CM | POA: Diagnosis not present

## 2013-11-16 DIAGNOSIS — I10 Essential (primary) hypertension: Secondary | ICD-10-CM | POA: Diagnosis not present

## 2013-11-16 DIAGNOSIS — E7800 Pure hypercholesterolemia, unspecified: Secondary | ICD-10-CM | POA: Diagnosis not present

## 2013-11-23 DIAGNOSIS — E039 Hypothyroidism, unspecified: Secondary | ICD-10-CM | POA: Diagnosis not present

## 2013-11-23 DIAGNOSIS — F17200 Nicotine dependence, unspecified, uncomplicated: Secondary | ICD-10-CM | POA: Diagnosis not present

## 2013-11-23 DIAGNOSIS — E7800 Pure hypercholesterolemia, unspecified: Secondary | ICD-10-CM | POA: Diagnosis not present

## 2013-11-23 DIAGNOSIS — F320 Major depressive disorder, single episode, mild: Secondary | ICD-10-CM | POA: Diagnosis not present

## 2014-03-18 DIAGNOSIS — T148 Other injury of unspecified body region: Secondary | ICD-10-CM | POA: Diagnosis not present

## 2014-03-18 DIAGNOSIS — W57XXXA Bitten or stung by nonvenomous insect and other nonvenomous arthropods, initial encounter: Secondary | ICD-10-CM | POA: Diagnosis not present

## 2014-03-18 DIAGNOSIS — R509 Fever, unspecified: Secondary | ICD-10-CM | POA: Diagnosis not present

## 2014-03-21 DIAGNOSIS — T148 Other injury of unspecified body region: Secondary | ICD-10-CM | POA: Diagnosis not present

## 2014-03-21 DIAGNOSIS — W57XXXA Bitten or stung by nonvenomous insect and other nonvenomous arthropods, initial encounter: Secondary | ICD-10-CM | POA: Diagnosis not present

## 2014-03-31 DIAGNOSIS — R5381 Other malaise: Secondary | ICD-10-CM | POA: Diagnosis not present

## 2014-03-31 DIAGNOSIS — R5383 Other fatigue: Secondary | ICD-10-CM | POA: Diagnosis not present

## 2014-03-31 DIAGNOSIS — R51 Headache: Secondary | ICD-10-CM | POA: Diagnosis not present

## 2014-03-31 DIAGNOSIS — M2550 Pain in unspecified joint: Secondary | ICD-10-CM

## 2014-03-31 HISTORY — DX: Pain in unspecified joint: M25.50

## 2014-04-03 ENCOUNTER — Ambulatory Visit (INDEPENDENT_AMBULATORY_CARE_PROVIDER_SITE_OTHER): Payer: Medicare Other | Admitting: Internal Medicine

## 2014-04-03 ENCOUNTER — Encounter (INDEPENDENT_AMBULATORY_CARE_PROVIDER_SITE_OTHER): Payer: Self-pay | Admitting: Internal Medicine

## 2014-04-03 VITALS — BP 122/74 | HR 78 | Temp 98.0°F | Resp 18 | Ht 66.0 in | Wt 149.5 lb

## 2014-04-03 DIAGNOSIS — Z860100 Personal history of colon polyps, unspecified: Secondary | ICD-10-CM | POA: Insufficient documentation

## 2014-04-03 DIAGNOSIS — K219 Gastro-esophageal reflux disease without esophagitis: Secondary | ICD-10-CM | POA: Diagnosis not present

## 2014-04-03 DIAGNOSIS — K220 Achalasia of cardia: Secondary | ICD-10-CM | POA: Diagnosis not present

## 2014-04-03 NOTE — Progress Notes (Signed)
Presenting complaint;  History of achalasia and colonic polyps.  History of present illness;  Patient is 78 year old Caucasian female who presents for scheduled visit. She was last seen over 5 years ago. She says this time for endoscopic evaluation. She is not having any GI symptoms. She rarely has heartburn. She is watching her diet closely and she does not take omeprazole daily. She denies nausea vomiting hoarseness chronic cough or shortness. Every now and then she she has difficulty swallowing solids and few extra sips of water helps. She also denies abdominal pain melena or rectal bleeding. She has not experienced diarrhea since she started the use lactose-free milk. Lately she has been experiencing headache and joint pains. These symptoms started about 2 months ago. She apparently had ? tick bites 2 months ago. She was treated with doxycycline for 2 weeks and so far she's not feeling any better. She had blood work for Lyme disease by Dr. Quintin Alto and the result is pending. She has lost 5 pounds since she's had these symptoms. She is planning to see neurologist regarding headache. He denies fever chills or night sweats.   Current Medications: Outpatient Encounter Prescriptions as of 04/03/2014  Medication Sig  . acetaminophen (TYLENOL) 325 MG tablet Take 325 mg by mouth daily.  Marland Kitchen estradiol (ESTRACE) 0.1 MG/GM vaginal cream Place 1 Applicatorful vaginally daily.  . fexofenadine (ALLEGRA) 180 MG tablet Take 180 mg by mouth as needed for allergies or rhinitis.  Marland Kitchen levothyroxine (SYNTHROID, LEVOTHROID) 100 MCG tablet Take 100 mcg by mouth daily before breakfast.  . Multiple Vitamins-Minerals (PRESERVISION/LUTEIN PO) Take by mouth daily.  Marland Kitchen omeprazole (PRILOSEC OTC) 20 MG tablet Take 20 mg by mouth as needed.  . pimecrolimus (ELIDEL) 1 % cream Apply 1 application topically daily.  . [DISCONTINUED] ALPRAZolam (XANAX) 0.25 MG tablet Take 0.25 mg by mouth as needed for anxiety.  . [DISCONTINUED]  amLODipine (NORVASC) 5 MG tablet Take 5 mg by mouth daily.  . [DISCONTINUED] Cholecalciferol (VITAMIN D3) 2000 UNITS TABS Take by mouth daily.  . [DISCONTINUED] diphenhydramine-acetaminophen (TYLENOL PM EXTRA STRENGTH) 25-500 MG TABS Take 1 tablet by mouth at bedtime as needed.  . [DISCONTINUED] estradiol (ESTRACE) 0.5 MG tablet Take 0.5 mg by mouth. Patient states that she takes by mouth every other day  . [DISCONTINUED] hydrochlorothiazide (HYDRODIURIL) 25 MG tablet Take 25 mg by mouth daily.  . [DISCONTINUED] losartan (COZAAR) 100 MG tablet Take 100 mg by mouth daily.  . [DISCONTINUED] vitamin E 400 UNIT capsule Take 400 Units by mouth daily.   Past medical history; History of achalasia. Status post pneumatic dilation in 1995 at [facility] with good symptomatic improvement. Last EGD was in February 2010. GERD. Hypothyroidism. Hyperlipidemia diet controlled. [place]  spotted fever 3 years ago. Total abdominal hysterectomy with incidental appendectomy at age 57. Decompression for right carpal tunnel over 30 years ago. Cholecystectomy for cholelithiasis but 25 years ago. Hemorrhoidectomy with removal of rectal polyp over 20 years ago. Last colonoscopy was in February, 2010 with removal of single polyp at [facility].  Allergies; No Known Allergies.  Family history; Mother lived to be 74 and father died at age 90. One sister died at 49 following complications of a ventral hernia repair. She has 3 sisters and one brother living. Brother is 43 in good health. Sisters are not doing well.  Social history; She was married for 45 years but now. Divorced. He does not have any children. 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 She's  been smoking cigarettes since she was 78 years old and now smokes less than half a pack of cigarettes per day she does not smoke daily. She drinks alcohol occasionally.   Objective: Blood pressure 122/74, pulse 78, temperature 98 F (36.7 C), temperature  source Oral, resp. rate 18, height 5\' 6"  (1.676 m), weight 149 lb 8 oz (67.813 kg). Patient is alert and in no acute distress. Conjunctiva is pink. Sclera is nonicteric Oropharyngeal mucosa is normal. No neck masses or thyromegaly noted. Cardiac exam with regular rhythm normal S1 and S2. No murmur or gallop noted. Lungs are clear to auscultation. Abdomen symmetrical soft and nontender. No organomegaly or masses. No LE edema or clubbing noted.   Assessment:  #1. History of achalasia. Status post pneumatic dilation in 1994. She is doing well except occasional dysphagia. Last EGD was 5-1/2 years ago and she is due for one now. #2. GERD. She is doing well with dietary measures and when necessary omeprazole. #3.. History of colonic adenomas. Last colonoscopy was in February 2010.   Plan:  Esophagogastroduodenoscopy and colonoscopy for surveillance purposes. Patient will call office to schedule these procedures when acute symptoms have been addressed and resolved.

## 2014-04-03 NOTE — Patient Instructions (Signed)
Please call our office to schedule EGD and colonoscopy when ongoing symptoms have resolved. Contact person is Ms.Lacretia Nicks at [phone]

## 2014-04-04 ENCOUNTER — Encounter: Payer: Self-pay | Admitting: *Deleted

## 2014-04-05 ENCOUNTER — Encounter: Payer: Self-pay | Admitting: Neurology

## 2014-04-05 ENCOUNTER — Ambulatory Visit (INDEPENDENT_AMBULATORY_CARE_PROVIDER_SITE_OTHER): Payer: Medicare Other | Admitting: Neurology

## 2014-04-05 VITALS — BP 158/77 | HR 72 | Ht 66.0 in | Wt 150.4 lb

## 2014-04-05 DIAGNOSIS — I69992 Facial weakness following unspecified cerebrovascular disease: Secondary | ICD-10-CM

## 2014-04-05 DIAGNOSIS — R209 Unspecified disturbances of skin sensation: Secondary | ICD-10-CM | POA: Diagnosis not present

## 2014-04-05 DIAGNOSIS — M2550 Pain in unspecified joint: Secondary | ICD-10-CM

## 2014-04-05 DIAGNOSIS — R51 Headache: Secondary | ICD-10-CM | POA: Diagnosis not present

## 2014-04-05 DIAGNOSIS — R29810 Facial weakness: Secondary | ICD-10-CM

## 2014-04-05 DIAGNOSIS — R5383 Other fatigue: Secondary | ICD-10-CM

## 2014-04-05 DIAGNOSIS — G459 Transient cerebral ischemic attack, unspecified: Secondary | ICD-10-CM

## 2014-04-05 DIAGNOSIS — R7309 Other abnormal glucose: Secondary | ICD-10-CM | POA: Diagnosis not present

## 2014-04-05 DIAGNOSIS — A6920 Lyme disease, unspecified: Secondary | ICD-10-CM

## 2014-04-05 DIAGNOSIS — G629 Polyneuropathy, unspecified: Secondary | ICD-10-CM

## 2014-04-05 DIAGNOSIS — G589 Mononeuropathy, unspecified: Secondary | ICD-10-CM

## 2014-04-05 DIAGNOSIS — R0989 Other specified symptoms and signs involving the circulatory and respiratory systems: Secondary | ICD-10-CM

## 2014-04-05 DIAGNOSIS — R5381 Other malaise: Secondary | ICD-10-CM

## 2014-04-05 DIAGNOSIS — R202 Paresthesia of skin: Secondary | ICD-10-CM

## 2014-04-05 DIAGNOSIS — R29818 Other symptoms and signs involving the nervous system: Secondary | ICD-10-CM

## 2014-04-05 DIAGNOSIS — IMO0002 Reserved for concepts with insufficient information to code with codable children: Secondary | ICD-10-CM

## 2014-04-05 MED ORDER — PROPRANOLOL HCL ER 80 MG PO CP24
80.0000 mg | ORAL_CAPSULE | Freq: Every day | ORAL | Status: DC
Start: 2014-04-05 — End: 2014-04-20

## 2014-04-05 NOTE — Patient Instructions (Addendum)
Overall you are doing fairly well but I do want to suggest a few things today:   Remember to drink plenty of fluid, eat healthy meals and do not skip any meals. Try to eat protein with a every meal and eat a healthy snack such as fruit or nuts in between meals. Try to keep a regular sleep-wake schedule and try to exercise daily, particularly in the form of walking, 20-30 minutes a day, if you can.   As far as your medications are concerned, I would like to suggest propranolol 80mg  daily. Common side effects include dizziness and low blood pressure. If severe, stop medication and call office  As far as diagnostic testing: MRi/MRA of the brain/test, NCS/EMG, Lumbar puncture, please proceed to lab for blood work  I would like to see you back in 3 months, sooner if we need to. Please call us with any interim questions, concerns, problems, updates or refill requests.   Please also call us for any test results so we can go over those with you on the phone.  My clinical assistant and will answer any of your questions and relay your messages to me and also relay most of my messages to you.   Our phone number is [phone]. We also have an after hours call service for urgent matters and there is a physician on-call for urgent questions. For any emergencies you know to call 911 or go to the nearest emergency room

## 2014-04-05 NOTE — Progress Notes (Signed)
[facility]    Provider:  Dr Jaynee Eagles Referring Provider: Manon Hilding, MD Primary Care Physician:  Manon Hilding, MD  CC:  Headaches  HPI:  Megan Daniel is a 78 y.o. female here as a referral from Dr. Quintin Alto for Headaches  78 year old female who is here for evaluation of headaches. This summer started having headaches daily. Wakes up in the morning and it starts hurting in the right frontal/parietal areas. Can't describe the pain, "just hurts". Tylenol will make it feel better. Bright lights make it worse or loud noises make it worse too. Takes tylenol daily which usually takes care of it but may start up again later in the day. She covers up the left eye and she feels better. Ice pack didn't help. 3 weeks ago started having chills and low grade fever and aching. Has Joint pain. RF+, ANA negative, sed rate normal but primary care is sending patient to rheumatology for evaluation.  Recently diagnosed with lyme disease and took antibiotics. No rash. No tick seen. But had a bite and was given doxycycline. Has not had imaging of the brain. Lyme test was negative but had taken antibitoics before the bloodwork. Has had several insect and tick bites. The joint pain is migrating. Feels like her face is assymmetric, one side of her face is more droopy. Has pain in the hands, hips, hands, fingers, knees but not all at the same time. No pain in the shoulders. +fatigue, is so tiredthat even  holding onto the steering wheel makes her fatigued. 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 No vision changes, no pain in the temples, no jaw claudication. Has paresthesias in her feet, feet feel hot and she has to put a cool washcloth on them and sleep outside the covers. Headaches can be severe, 7/10.     Reviewed notes, labs and imaging from outside physicians, which showed cmp, cbc unremarkable, ana negative, RA high, lyme  total Ab neg  Review of Systems: Patient complains of symptoms per HPI as well as the following symptoms memory loss, insomnia, headache, fatigue, feet hot, swelling in legs, joint pain, itching, frequent infections. Pertinent negatives per HPI. Otherwise out of a complete 14 system review, and all other reviewed systems are negative.   History   Social History  . Marital Status: Divorced    Spouse Name: N/A    Number of Children: 0  . Years of Education: RN   Occupational History  . Not on file.   Social History Main Topics  . Smoking status: Current Every Day Smoker -- 0.50 packs/day    Types: Cigarettes  . Smokeless tobacco: Never Used     Comment: smokes 1/2 pack daily  . Alcohol Use: Yes     Comment: Drinks wine  . Drug Use: No  . Sexual Activity: Not on file   Other Topics Concern  . Not on file   Social History Narrative   Patient is divorced, no children.   Patient is a retired Equities trader.   Patient is right handed.   Patient drinks 2 cups daily.    Family History  Problem Relation Age of Onset  . Alzheimer's disease Father   . Alzheimer's disease Sister   . Macular degeneration Sister   . Healthy Brother   . Arthritis Sister   . Arthritis Sister   . COPD Sister   . Heart  disease Mother   . Seizures Sister   . Seizures Other     nephew  . Alcohol abuse Sister     Past Medical History  Diagnosis Date  . Hypertension   . Hyperchloremia   . Diverticulitis   . GERD (gastroesophageal reflux disease)   . RMSF [facility] spotted fever)   . Headache   . Fatigue   . Arthralgia 03/31/2014  . Allergic rhinitis   . Hypercholesterolemia   . Osteoporosis   . Abdominal pain   . Anxiety 10/03/2013  . Bronchitis 09/09/2013  . Cellulitis   . Headache   . Lumbar strain   . Hypothyroidism   . Rheumatoid arthritis   . Migraine     Past Surgical History  Procedure Laterality Date  . Total abdominal hysterectomy      Age 42  .  Cholecystectomy      20 yrs ago  . Colonoscopy    . Hemorroidectomy      2005  . Polypectomy    . Upper gastrointestinal endoscopy    . Carpal tunnel release      25 years ago    Current Outpatient Prescriptions  Medication Sig Dispense Refill  . acetaminophen (TYLENOL) 325 MG tablet Take 325 mg by mouth every 6 (six) hours as needed.       Marland Kitchen estradiol (ESTRACE) 0.1 MG/GM vaginal cream Place 1 Applicatorful vaginally daily.      . fexofenadine (ALLEGRA) 180 MG tablet Take 180 mg by mouth as needed for allergies or rhinitis.      . hydrocortisone 2.5 % cream Apply 1 application topically 2 (two) times daily.      Marland Kitchen levothyroxine (SYNTHROID, LEVOTHROID) 100 MCG tablet Take 100 mcg by mouth daily before breakfast.      . Melatonin 5 MG TABS Take 5 mg by mouth as needed.      . Multiple Vitamins-Minerals (PRESERVISION/LUTEIN PO) Take by mouth daily.      Marland Kitchen omeprazole (PRILOSEC OTC) 20 MG tablet Take 20 mg by mouth daily.       . pimecrolimus (ELIDEL) 1 % cream Apply 1 application topically daily.      . propranolol ER (INDERAL LA) 80 MG 24 hr capsule Take 1 capsule (80 mg total) by mouth daily.  30 capsule  3   No current facility-administered medications for this visit.    Allergies as of 04/05/2014  . (No Known Allergies)    Vitals: BP 158/77  Pulse 72  Ht 5\' 6"  (1.676 m)  Wt 150 lb 6.4 oz (68.221 kg)  BMI 24.29 kg/m2 Last Weight:  Wt Readings from Last 1 Encounters:  04/05/14 150 lb 6.4 oz (68.221 kg)   Last Height:   Ht Readings from Last 1 Encounters:  04/05/14 5\' 6"  (1.676 m)   Physical exam: Exam: Gen: NAD, conversant Eyes: anicteric sclerae, moist conjunctivae HENT: Atraumatic, oropharynx clear Neck: Trachea midline; supple,  Lungs: CTA, no wheezing, rales, rhonic                          CV: RRR, no MRG Abdomen: Soft, non-tender;  Extremities: No peripheral edema  Skin: Normal temperature, no rash,  Psych: Appropriate affect, pleasant  Neuro: Detailed  Neurologic Exam  Speech:    Speech is normal; fluent and spontaneous with normal comprehension.  Cognition:    The patient is oriented to person, place, and time; memory intact; language fluent; normal attention, concentration, and fund  of knowledge.  Cranial Nerves:    The pupils are equal, round, and reactive to light. The fundi are normal and spontaneous venous pulsations are present. Visual fields are full to finger confrontation. Extraocular movements are intact. Trigeminal sensation is intact and the muscles of mastication are normal. Possibly some right nasolabial flattening. The palate elevates in the midline. Voice is normal. Shoulder shrug is normal. The tongue has normal motion without fasciculations.   Coordination:    Normal finger to nose and heel to shin. Normal rapid alternating movements.   Gait:    Heel-toe and tandem gait are normal.   Motor Observation:    No asymmetry, no atrophy, and no involuntary movements noted. Tone:    Normal muscle tone.    Posture:    Posture is normal. normal erect    Strength:    Strength is V/V in the upper and lower limbs.   Sensory: Decreased vibration, temp, pp in the lower extremities  Reflex Exam:  DTR's:    Deep tendon reflexes in the upper and lower extremities are normal bilaterally.   Toes:    The toes are downgoing bilaterally.   Clonus:    Clonus is absent.       Assessment/Plan:  78 year old female with new onset, severe, worsening headaches. Has some migrainous features but unusual for someone in their 8th decades to develop migraines. Need to evaluate for other causes. Given her history of lyme disease and multiple tick bites in addition to migratory joint pain, possible facial asymmetry and fatigue need to evaluate for neuro lyme with LP. Also need MRI/MRA of the brain. For sensory changes distally will order serum neuropathy panel and emg/ncs.   lp with protein, glucose, cell diff and lyme to rule out neuro  lyme Mri brain and mra in this 78 year old patient with new onset severe and worsening headaches (no PMHx of headaches)  Neuropathy panel for decreased sensory in the lower extremities and emg/ncs Patient's BP is nildly elevated, will start propranolol for headaches. Discussed most common side effects.  [name], MD  [facility] [address] [place], [place] [address]  Phone [phone] Fax [phone]

## 2014-04-06 LAB — HEMOGLOBIN A1C
Est. average glucose Bld gHb Est-mCnc: 128 mg/dL
Hgb A1c MFr Bld: 6.1 % — ABNORMAL HIGH (ref 4.8–5.6)

## 2014-04-08 ENCOUNTER — Encounter: Payer: Self-pay | Admitting: Neurology

## 2014-04-08 DIAGNOSIS — M2550 Pain in unspecified joint: Secondary | ICD-10-CM | POA: Insufficient documentation

## 2014-04-08 DIAGNOSIS — R5383 Other fatigue: Secondary | ICD-10-CM | POA: Insufficient documentation

## 2014-04-08 DIAGNOSIS — A6920 Lyme disease, unspecified: Secondary | ICD-10-CM | POA: Insufficient documentation

## 2014-04-08 DIAGNOSIS — R519 Headache, unspecified: Secondary | ICD-10-CM | POA: Insufficient documentation

## 2014-04-08 DIAGNOSIS — R51 Headache: Secondary | ICD-10-CM | POA: Insufficient documentation

## 2014-04-08 DIAGNOSIS — R5381 Other malaise: Secondary | ICD-10-CM | POA: Insufficient documentation

## 2014-04-08 DIAGNOSIS — G629 Polyneuropathy, unspecified: Secondary | ICD-10-CM | POA: Insufficient documentation

## 2014-04-10 LAB — IFE AND PE, SERUM
ALBUMIN SERPL ELPH-MCNC: 4.1 g/dL (ref 3.2–5.6)
ALBUMIN/GLOB SERPL: 1.5 (ref 0.7–2.0)
Alpha 1: 0.2 g/dL (ref 0.1–0.4)
Alpha2 Glob SerPl Elph-Mcnc: 0.8 g/dL (ref 0.4–1.2)
B-GLOBULIN SERPL ELPH-MCNC: 0.9 g/dL (ref 0.6–1.3)
GAMMA GLOB SERPL ELPH-MCNC: 1.1 g/dL (ref 0.5–1.6)
GLOBULIN, TOTAL: 2.9 g/dL (ref 2.0–4.5)
IgA/Immunoglobulin A, Serum: 98 mg/dL (ref 91–414)
IgG (Immunoglobin G), Serum: 1036 mg/dL (ref 700–1600)
IgM (Immunoglobulin M), Srm: 51 mg/dL (ref 40–230)
Total Protein: 7 g/dL (ref 6.0–8.5)

## 2014-04-10 LAB — SJOGREN'S SYNDROME ANTIBODS(SSA + SSB): ENA SSA (RO) Ab: <0.2 AI (ref 0.0–0.9)

## 2014-04-11 ENCOUNTER — Ambulatory Visit (INDEPENDENT_AMBULATORY_CARE_PROVIDER_SITE_OTHER): Payer: Medicare Other | Admitting: Neurology

## 2014-04-11 ENCOUNTER — Encounter (INDEPENDENT_AMBULATORY_CARE_PROVIDER_SITE_OTHER): Payer: Medicare Other | Admitting: Radiology

## 2014-04-11 DIAGNOSIS — R209 Unspecified disturbances of skin sensation: Secondary | ICD-10-CM

## 2014-04-11 DIAGNOSIS — Z0289 Encounter for other administrative examinations: Secondary | ICD-10-CM

## 2014-04-11 DIAGNOSIS — G609 Hereditary and idiopathic neuropathy, unspecified: Secondary | ICD-10-CM

## 2014-04-11 DIAGNOSIS — R202 Paresthesia of skin: Secondary | ICD-10-CM

## 2014-04-11 NOTE — Progress Notes (Signed)
  [facility]    Provider:  Dr Jaynee Eagles Referring Provider: Manon Hilding, MD Primary Care Physician:  Manon Hilding, MD  History:  Megan Daniel is a 78 y.o. female here for evaluation or paresthesias in her feet. Recently diagnosed with lyme disease and took antibiotics. No rash. No tick seen. But had a bite and was given doxycycline. She has migrating joint pain and was recently diagnosed as RF+.  +fatigue, is so tiredthat even holding onto the steering wheel makes her fatigued. Has paresthesias in her feet, feet feel hot and she has to put a cool washcloth on them and sleep outside the covers. Focused exam demonstrates:   Summary: Nerve conduction studies were performed on the bilateral lower extremities.  Bilateral Peroneal and Tibial motor conductions were within normal limits with normal F Wave latencies.  Bilateral Sural and Peroneal sensory conductions were within normal limits The right H Reflex latency was prolonged (34.38ms, N<33.76) The left H Reflex latency was prolonged (34.92ms, N<33.76)  EMG needle study was performed on selected right lower extremity muscles. The Vastus Medialis, Anterior Tibialis, Medial Gastrocnemius, Extensor Hallucis Longus and Abductor Hallucis muscles were within normal limits.  Conclusion:  This is an abnormal study. Delayed H reflexes may be seen in early/mild peripheral polyneuropathy, S1-level root compression or may be due to normal age-related changes. 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 No suggestion of mononeuritis multiplex that can be caused by disorders such as Lyme Disease. Clinical correlation recommended.    [name], MD  [facility] [address] [place], [place] [address]  Phone [phone] Fax [phone]

## 2014-04-13 ENCOUNTER — Other Ambulatory Visit: Payer: Self-pay | Admitting: Neurology

## 2014-04-13 ENCOUNTER — Ambulatory Visit
Admission: RE | Admit: 2014-04-13 | Discharge: 2014-04-13 | Disposition: A | Discharge status: Home / Self Care | Payer: Medicare Other | Source: Ambulatory Visit | Attending: Neurology | Admitting: Neurology

## 2014-04-13 VITALS — BP 114/52 | HR 51

## 2014-04-13 DIAGNOSIS — R29818 Other symptoms and signs involving the nervous system: Secondary | ICD-10-CM | POA: Diagnosis not present

## 2014-04-13 DIAGNOSIS — M2550 Pain in unspecified joint: Secondary | ICD-10-CM | POA: Diagnosis not present

## 2014-04-13 DIAGNOSIS — R29810 Facial weakness: Secondary | ICD-10-CM

## 2014-04-13 DIAGNOSIS — R51 Headache: Secondary | ICD-10-CM

## 2014-04-13 DIAGNOSIS — IMO0002 Reserved for concepts with insufficient information to code with codable children: Secondary | ICD-10-CM

## 2014-04-13 DIAGNOSIS — G459 Transient cerebral ischemic attack, unspecified: Secondary | ICD-10-CM

## 2014-04-13 DIAGNOSIS — A6920 Lyme disease, unspecified: Secondary | ICD-10-CM

## 2014-04-13 LAB — CSF CELL COUNT WITH DIFFERENTIAL
RBC Count, CSF: 0 cu mm
TUBE #: 3
WBC, CSF: 0 cu mm (ref 0–5)

## 2014-04-13 LAB — PROTEIN, CSF: Total Protein, CSF: 47 mg/dL — ABNORMAL HIGH (ref 15–45)

## 2014-04-13 LAB — GLUCOSE, CSF: GLUCOSE CSF: 57 mg/dL (ref 43–76)

## 2014-04-13 NOTE — Progress Notes (Signed)
One tiger-topped tube of blood drawn for LP labs from right [facility] space without difficulty; site unremarkable.  Discharge instructions explained to patient.  jkl

## 2014-04-13 NOTE — Discharge Instructions (Signed)

## 2014-04-14 ENCOUNTER — Other Ambulatory Visit: Payer: Self-pay | Admitting: Neurology

## 2014-04-14 ENCOUNTER — Telehealth: Payer: Self-pay | Admitting: Neurology

## 2014-04-14 NOTE — Progress Notes (Signed)
I returned patient's call about having a positional headache since getting up from her strict 24 hours of bedrest following her LP here yesterday at 1230.  She describes a typical spinal headache, though says Tylenol helps with the pain some.  She's drinking coffee and tea but will stop soon so that she can sleep tonight.  She will drink water and decaffeinated beverages for the rest of the day.  She understands the headache will not cause her any brain damage, so she plans to come here tomorrow for her brain MRI.  She told me she will call me Monday morning (04/17/14) if the headache persists to discuss an epidural blood patch.  jkl

## 2014-04-14 NOTE — Telephone Encounter (Signed)
Called patient. Recommended bed rest, fluids and caffeine. If headache not relieved this weekend call back and may need to have a blood patch done.

## 2014-04-14 NOTE — Telephone Encounter (Signed)
Dr. Jaynee Eagles is in office, I will forward her the message.

## 2014-04-14 NOTE — Telephone Encounter (Signed)
WID:  Spoke to Dr. [name] patient and she had a LP yesterday.  Today she stated every time she stands her head starts hurting.  Also she has a MRI and MRA head tomorrow and wants to know if she should drive then.

## 2014-04-15 ENCOUNTER — Ambulatory Visit
Admission: RE | Admit: 2014-04-15 | Discharge: 2014-04-15 | Disposition: A | Discharge status: Home / Self Care | Payer: Medicare Other | Source: Ambulatory Visit | Attending: Neurology | Admitting: Neurology

## 2014-04-15 DIAGNOSIS — G459 Transient cerebral ischemic attack, unspecified: Secondary | ICD-10-CM

## 2014-04-15 DIAGNOSIS — R29810 Facial weakness: Secondary | ICD-10-CM

## 2014-04-15 DIAGNOSIS — R29818 Other symptoms and signs involving the nervous system: Secondary | ICD-10-CM

## 2014-04-15 DIAGNOSIS — R51 Headache: Secondary | ICD-10-CM

## 2014-04-15 DIAGNOSIS — IMO0002 Reserved for concepts with insufficient information to code with codable children: Secondary | ICD-10-CM

## 2014-04-17 ENCOUNTER — Other Ambulatory Visit: Payer: Self-pay | Admitting: Neurology

## 2014-04-17 ENCOUNTER — Other Ambulatory Visit: Payer: Medicare Other

## 2014-04-17 ENCOUNTER — Telehealth: Payer: Self-pay | Admitting: Neurology

## 2014-04-17 DIAGNOSIS — R413 Other amnesia: Secondary | ICD-10-CM

## 2014-04-17 LAB — B. BURGDORFI ANTIBODIES: B burgdorferi Ab IgG+IgM: 0.64 {ISR}

## 2014-04-17 NOTE — Telephone Encounter (Signed)
Also discussed decreased propranolol back to 1 tablet dailt, pulse of 40 is too low. Was mid 65s on pone tab propranolol.

## 2014-04-17 NOTE — Telephone Encounter (Signed)
Called patient. Discussed MRi results, LP results. She is feeling better. But if LP headache returns will order a blood patch.

## 2014-04-17 NOTE — Telephone Encounter (Signed)
Patient has called back to relay new symptoms.  She has diarrhea, and her BP has increased and her pulse is low about 40.

## 2014-04-17 NOTE — Telephone Encounter (Signed)
Patient called today relaying that she is still having a headache from the LP done on Thursday.  Please advise.

## 2014-04-18 ENCOUNTER — Encounter: Payer: Self-pay | Admitting: *Deleted

## 2014-04-18 ENCOUNTER — Telehealth: Payer: Self-pay | Admitting: Neurology

## 2014-04-18 ENCOUNTER — Other Ambulatory Visit: Payer: Self-pay | Admitting: Neurology

## 2014-04-18 MED ORDER — AZITHROMYCIN 250 MG PO TABS: ORAL_TABLET | ORAL | Status: DC

## 2014-04-18 NOTE — Progress Notes (Signed)
Quick Note:  Called patient on 9/14 & 9/15, only number on file is disconnected, letter will be mailed out with lab results. ______

## 2014-04-18 NOTE — Telephone Encounter (Signed)
Spoke to patient and her LP headache has improved, no need for blood patch. She feels she does has sinus pain which is consistent with sinusitis on mri of brain. She has taken azithromycin in the past without side effects. Will give her a z-pack to see if headache improves.

## 2014-04-20 ENCOUNTER — Telehealth: Payer: Self-pay | Admitting: Neurology

## 2014-04-20 ENCOUNTER — Telehealth: Payer: Self-pay

## 2014-04-20 ENCOUNTER — Other Ambulatory Visit: Payer: Self-pay | Admitting: Neurology

## 2014-04-20 MED ORDER — VERAPAMIL HCL ER 120 MG PO TBCR: 120.0000 mg | EXTENDED_RELEASE_TABLET | Freq: Every day | ORAL | Status: DC

## 2014-04-20 MED ORDER — METHYLPREDNISOLONE 4 MG PO KIT: PACK | ORAL | Status: DC

## 2014-04-20 NOTE — Telephone Encounter (Signed)
Spoke with patient at length. LP headache resolved. Still having daily headache though. Will stop propranolol and start 120mg  daily verapamil. Will also order a medrol dosepak to try and decrease the inflammation

## 2014-04-20 NOTE — Telephone Encounter (Signed)
Spoke to patient. Gave lab result. She was grateful. She requested to speak w/ Dr. Jaynee Eagles because she still is having a concerning HA and would like med management.

## 2014-04-21 LAB — B. BURGDORFI ANTIBODIES, CSF: LYME AB: NEGATIVE

## 2014-04-21 NOTE — Telephone Encounter (Signed)
i spoke with patient at length last evening. Stopped propranolol. Started verapamil. Ordered last night. Need to wait a few days to see if it works. Unclear why patient calling back so soon, is it getting much worse? Please discuss with dr Janann Colonel and see if he agrees. Thanks.

## 2014-04-21 NOTE — Telephone Encounter (Signed)
Discussed plan with Dr Jaynee Eagles. On the evening of 9/17 she instructed patient to start Verapamil. No further intervention at this time.

## 2014-04-28 ENCOUNTER — Telehealth: Payer: Self-pay | Admitting: *Deleted

## 2014-04-28 NOTE — Telephone Encounter (Signed)
Letter was mailed out to patient on 04/18/14 of normal lab results, and also requested patient to call the office and update her phone number because it was disconnected. Letter was returned back to the office from the post office stating it was undeliverable and unable to forward.

## 2014-05-10 DIAGNOSIS — E780 Pure hypercholesterolemia: Secondary | ICD-10-CM | POA: Diagnosis not present

## 2014-05-10 DIAGNOSIS — I10 Essential (primary) hypertension: Secondary | ICD-10-CM | POA: Diagnosis not present

## 2014-05-11 DIAGNOSIS — E780 Pure hypercholesterolemia: Secondary | ICD-10-CM | POA: Diagnosis not present

## 2014-05-12 ENCOUNTER — Telehealth: Payer: Self-pay | Admitting: Neurology

## 2014-05-12 NOTE — Telephone Encounter (Signed)
Patient calling to state that her Verapamil has been helping with her headaches but her blood pressure is still high, please return call and advise.

## 2014-05-16 DIAGNOSIS — M79643 Pain in unspecified hand: Secondary | ICD-10-CM | POA: Diagnosis not present

## 2014-05-16 DIAGNOSIS — R768 Other specified abnormal immunological findings in serum: Secondary | ICD-10-CM | POA: Diagnosis not present

## 2014-05-16 DIAGNOSIS — R5382 Chronic fatigue, unspecified: Secondary | ICD-10-CM | POA: Diagnosis not present

## 2014-05-16 DIAGNOSIS — M2550 Pain in unspecified joint: Secondary | ICD-10-CM | POA: Diagnosis not present

## 2014-05-16 DIAGNOSIS — M21839 Other specified acquired deformities of unspecified forearm: Secondary | ICD-10-CM | POA: Diagnosis not present

## 2014-05-17 DIAGNOSIS — M069 Rheumatoid arthritis, unspecified: Secondary | ICD-10-CM | POA: Diagnosis not present

## 2014-05-17 DIAGNOSIS — Z23 Encounter for immunization: Secondary | ICD-10-CM | POA: Diagnosis not present

## 2014-05-17 DIAGNOSIS — I10 Essential (primary) hypertension: Secondary | ICD-10-CM | POA: Diagnosis not present

## 2014-05-17 DIAGNOSIS — E780 Pure hypercholesterolemia: Secondary | ICD-10-CM | POA: Diagnosis not present

## 2014-05-17 DIAGNOSIS — R5382 Chronic fatigue, unspecified: Secondary | ICD-10-CM | POA: Diagnosis not present

## 2014-05-17 DIAGNOSIS — R51 Headache: Secondary | ICD-10-CM | POA: Diagnosis not present

## 2014-06-06 ENCOUNTER — Encounter: Payer: Self-pay | Admitting: Neurology

## 2014-06-28 DIAGNOSIS — R768 Other specified abnormal immunological findings in serum: Secondary | ICD-10-CM | POA: Diagnosis not present

## 2014-06-28 DIAGNOSIS — M2550 Pain in unspecified joint: Secondary | ICD-10-CM | POA: Diagnosis not present

## 2014-06-28 DIAGNOSIS — Z13820 Encounter for screening for osteoporosis: Secondary | ICD-10-CM | POA: Diagnosis not present

## 2014-06-28 DIAGNOSIS — M21839 Other specified acquired deformities of unspecified forearm: Secondary | ICD-10-CM | POA: Diagnosis not present

## 2014-07-05 ENCOUNTER — Ambulatory Visit (INDEPENDENT_AMBULATORY_CARE_PROVIDER_SITE_OTHER): Payer: Medicare Other | Admitting: Neurology

## 2014-07-05 ENCOUNTER — Encounter: Payer: Self-pay | Admitting: Neurology

## 2014-07-05 VITALS — BP 136/65 | HR 70 | Ht 66.0 in | Wt 147.0 lb

## 2014-07-05 DIAGNOSIS — E538 Deficiency of other specified B group vitamins: Secondary | ICD-10-CM

## 2014-07-05 DIAGNOSIS — R7302 Impaired glucose tolerance (oral): Secondary | ICD-10-CM

## 2014-07-05 NOTE — Progress Notes (Signed)
[facility]    Provider:  Dr Jaynee Eagles Referring Provider: Manon Hilding, MD Primary Care Physician:  Manon Hilding, MD  CC:  Headaches  HPI:  Megan Daniel is a 78 y.o. female here as a follow up for headaches  Interval History: Her headaches are improved. No real headaches since taking the prednisone and starting verapamil. She gets them possibly every once in a while, but mild. Her Verapamil pills changed colors but she is feeling the same, still helping. No side effects from the Verapamil. She is on methotrexate for Rheumatoid Arthritis and it is helping. She sees Dr. Amil Amen. Her joint pain was terrible. Her joint pains are better after a month of methotrexate.  She still reports paresthesias in her feet.   Reviewed notes, labs and imaging from outside physicians, which showed: Labs in Dr Valora Piccolo office recently showed CMP/CBC unremarkable, Hepatitis panel negative. CCP Ab negative. CRP 11, high.   Previous Appointment 59/2145:   78 year old female who is here for evaluation of headaches. This summer started having headaches daily. Wakes up in the morning and it starts hurting in the right frontal/parietal areas. Can't describe the pain, "just hurts". Tylenol will make it feel better. Bright lights make it worse or loud noises make it worse too. Takes tylenol daily which usually takes care of it but may start up again later in the day. She covers up the left eye and she feels better. Ice pack didn't help. 3 weeks ago started having chills and low grade fever and aching. Has Joint pain. RF+, ANA negative, sed rate normal but primary care is sending patient to rheumatology for evaluation. Recently diagnosed with lyme disease and took antibiotics. No rash. No tick seen. But had a bite and was given doxycycline. Has not had imaging of the brain. Lyme test was negative but had taken antibitoics before the bloodwork. Has had several insect and tick bites. The joint pain is  migrating. Feels like her face is assymmetric, one side of her face is more droopy. Has pain in the hands, hips, hands, fingers, knees but not all at the same time. No pain in the shoulders. +fatigue, is so tiredthat even holding onto the steering wheel makes her fatigued. 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 No vision changes, no pain in the temples, no jaw claudication. Has paresthesias in her feet, feet feel hot and she has to put a cool washcloth on them and sleep outside the covers. Headaches can be severe, 7/10.   Review of Systems: Patient complains of symptoms per HPI as well as the following symptoms No CP, No SOB. Pertinent negatives per HPI. All others negative.   History   Social History  . Marital Status: Divorced    Spouse Name: N/A    Number of Children: 0  . Years of Education: RN   Occupational History  . Not on file.   Social History Main Topics  . Smoking status: Current Every Day Smoker -- 0.50 packs/day    Types: Cigarettes  . Smokeless tobacco: Never Used     Comment: smokes 1/2 pack daily  . Alcohol Use: Yes     Comment: Drinks wine  . Drug Use: No  . Sexual Activity: Not on file   Other Topics Concern  . Not on file   Social History Narrative   Patient is divorced, no children.   Patient is  a retired Equities trader.   Patient is right handed.   Patient drinks 2 cups daily.    Family History  Problem Relation Age of Onset  . Alzheimer's disease Father   . Alzheimer's disease Sister   . Macular degeneration Sister   . Healthy Brother   . Arthritis Sister   . Arthritis Sister   . COPD Sister   . Heart disease Mother   . Seizures Sister   . Seizures Other     nephew  . Alcohol abuse Sister     Past Medical History  Diagnosis Date  . Hypertension   . Hyperchloremia   . Diverticulitis   . GERD (gastroesophageal reflux disease)   . RMSF [facility] spotted fever)     . Headache   . Fatigue   . Arthralgia 03/31/2014  . Allergic rhinitis   . Hypercholesterolemia   . Osteoporosis   . Abdominal pain   . Anxiety 10/03/2013  . Bronchitis 09/09/2013  . Cellulitis   . Headache   . Lumbar strain   . Hypothyroidism   . Rheumatoid arthritis   . Migraine     Past Surgical History  Procedure Laterality Date  . Total abdominal hysterectomy      Age 69  . Cholecystectomy      20 yrs ago  . Colonoscopy    . Hemorroidectomy      2005  . Polypectomy    . Upper gastrointestinal endoscopy    . Carpal tunnel release      25 years ago    Current Outpatient Prescriptions  Medication Sig Dispense Refill  . acetaminophen (TYLENOL) 325 MG tablet Take 325 mg by mouth every 6 (six) hours as needed.     Marland Kitchen azithromycin (ZITHROMAX Z-PAK) 250 MG tablet Take 2 tabes the first day then take 1 tab daily for 4 days. 6 each 0  . estradiol (ESTRACE) 0.1 MG/GM vaginal cream Place 1 Applicatorful vaginally daily.    . fexofenadine (ALLEGRA) 180 MG tablet Take 180 mg by mouth as needed for allergies or rhinitis.    . hydrocortisone 2.5 % cream Apply 1 application topically 2 (two) times daily.    Marland Kitchen levothyroxine (SYNTHROID, LEVOTHROID) 100 MCG tablet Take 100 mcg by mouth daily before breakfast.    . Melatonin 5 MG TABS Take 5 mg by mouth as needed.    . methylPREDNISolone (MEDROL DOSEPAK) 4 MG tablet follow package directions 21 tablet 0  . Multiple Vitamins-Minerals (PRESERVISION/LUTEIN PO) Take by mouth daily.    Marland Kitchen omeprazole (PRILOSEC OTC) 20 MG tablet Take 20 mg by mouth daily.     . pimecrolimus (ELIDEL) 1 % cream Apply 1 application topically daily.    . verapamil (CALAN-SR) 120 MG CR tablet Take 1 tablet (120 mg total) by mouth at bedtime. 30 tablet 6   No current facility-administered medications for this visit.    Allergies as of 07/05/2014  . (No Known Allergies)    Vitals: BP 136/65 mmHg  Pulse 70  Ht 5\' 6"  (1.676 m)  Wt 147 lb (66.679 kg)  BMI  23.74 kg/m2 Last Weight:  Wt Readings from Last 1 Encounters:  07/05/14 147 lb (66.679 kg)   Last Height:   Ht Readings from Last 1 Encounters:  07/05/14 5\' 6"  (1.676 m)   Physical exam: Exam: Gen: NAD, conversant Eyes: anicteric sclerae, moist conjunctivae  CV: RRR, no MRG  Neuro: Detailed Neurologic Exam  Speech:  Speech is normal; fluent and spontaneous with normal  comprehension.  Cognition:  The patient is oriented to person, place, and time; memory intact; language fluent; normal attention, concentration, and fund of knowledge.  Cranial Nerves:  The pupils are equal, round, and reactive to light. The fundi are normal and spontaneous venous pulsations are present. Visual fields are full to finger confrontation. Extraocular movements are intact. Trigeminal sensation is intact and the muscles of mastication are normal. Possibly some right nasolabial flattening. The palate elevates in the midline. Voice is normal. Shoulder shrug is normal. The tongue has normal motion without fasciculations.   Coordination:  Normal finger to nose and heel to shin. Normal rapid alternating movements.   Gait:  Heel-toe and tandem gait are normal.   Motor Observation:  No asymmetry, no atrophy, and no involuntary movements noted. Tone:  Normal muscle tone.   Posture:  Posture is normal. normal erect   Strength:  Strength is V/V in the upper and lower limbs.   Sensory: Decreased vibration, temp, pp in the lower extremities  Reflex Exam:  DTR's:  Deep tendon reflexes in the upper and lower extremities are normal bilaterally.  Toes:  The toes are downgoing bilaterally.  Clonus:  Clonus is absent.  Assessment/Plan: 39 y ear old female here for follow up of severe, worsening headaches. Has some migrainous features. Improved with medrol dose pack and Verapamil. Still reports paresthesias in her feet, stable.   Patient reported  lyme disease  and multiple tick bites and migratory joint pain, possible facial asymmetry and fatigue which prompted LP which was normal (neg lyme) and she was subsequently diagnosed with RA and is now on methotrexate and seeing Dr. Amil Amen. MRi of the brain unremarkable. Neuropathy panel showed HgbA1c 6.1, not significant but she would like it to be repeated along with B12 because she is worried about her B12 level and about having diabetes (I reviewed and reassured patient). Don't recommend B12 shots, unless she is deficient of course, and then we can try to supplement with oral B12 if she is low depending on her level - tried to reassure patient again. Neuro exam shows distal decrease in vibration, temp, pp in the lower extremities and emg/ncs showed mild sensory axonal polyneuropathy.  Continue Verapamil.   Follow up in 3 months.      Assessment/Plan:    [name], MD  [facility] [address] [place], [place] [address]  Phone [phone] Fax [phone]

## 2014-07-07 ENCOUNTER — Other Ambulatory Visit: Payer: Self-pay

## 2014-07-07 DIAGNOSIS — Z1231 Encounter for screening mammogram for malignant neoplasm of breast: Secondary | ICD-10-CM

## 2014-07-07 LAB — B12 AND FOLATE PANEL
Folate: >19.9 ng/mL (ref >3.0)
Vitamin B-12: 552 pg/mL (ref 211–946)

## 2014-07-07 LAB — HEMOGLOBIN A1C
ESTIMATED AVERAGE GLUCOSE: 114 mg/dL
Hgb A1c MFr Bld: 5.6 % (ref 4.8–5.6)

## 2014-07-07 LAB — METHYLMALONIC ACID, SERUM: Methylmalonic Acid: 237 nmol/L (ref 0–378)

## 2014-07-10 ENCOUNTER — Telehealth: Payer: Self-pay | Admitting: Neurology

## 2014-07-10 NOTE — Telephone Encounter (Signed)
Called and discussed labs with patient, B12 and mma normal and Hgba1c went from 6.1 to 5.6 with diet modification.

## 2014-07-11 ENCOUNTER — Telehealth: Payer: Self-pay | Admitting: *Deleted

## 2014-07-11 NOTE — Telephone Encounter (Signed)
Patient labs sent to [facility].

## 2014-07-19 DIAGNOSIS — K5732 Diverticulitis of large intestine without perforation or abscess without bleeding: Secondary | ICD-10-CM | POA: Diagnosis not present

## 2014-07-21 ENCOUNTER — Ambulatory Visit: Payer: Medicare Other

## 2014-08-16 ENCOUNTER — Other Ambulatory Visit (INDEPENDENT_AMBULATORY_CARE_PROVIDER_SITE_OTHER): Payer: Self-pay | Admitting: *Deleted

## 2014-08-16 DIAGNOSIS — K220 Achalasia of cardia: Secondary | ICD-10-CM

## 2014-08-16 DIAGNOSIS — Z86010 Personal history of colonic polyps: Secondary | ICD-10-CM

## 2014-08-16 DIAGNOSIS — H538 Other visual disturbances: Secondary | ICD-10-CM | POA: Diagnosis not present

## 2014-08-30 DIAGNOSIS — M810 Age-related osteoporosis without current pathological fracture: Secondary | ICD-10-CM | POA: Diagnosis not present

## 2014-08-30 DIAGNOSIS — M2550 Pain in unspecified joint: Secondary | ICD-10-CM | POA: Diagnosis not present

## 2014-08-30 DIAGNOSIS — R768 Other specified abnormal immunological findings in serum: Secondary | ICD-10-CM | POA: Diagnosis not present

## 2014-08-30 DIAGNOSIS — M0609 Rheumatoid arthritis without rheumatoid factor, multiple sites: Secondary | ICD-10-CM | POA: Diagnosis not present

## 2014-09-04 ENCOUNTER — Telehealth (INDEPENDENT_AMBULATORY_CARE_PROVIDER_SITE_OTHER): Payer: Self-pay | Admitting: *Deleted

## 2014-09-04 DIAGNOSIS — Z1211 Encounter for screening for malignant neoplasm of colon: Secondary | ICD-10-CM

## 2014-09-04 NOTE — Telephone Encounter (Signed)
Patient needs trilyte 

## 2014-09-07 MED ORDER — PEG 3350-KCL-NA BICARB-NACL 420 G PO SOLR: 4000.0000 mL | Freq: Once | ORAL | Status: DC

## 2014-09-19 ENCOUNTER — Encounter (INDEPENDENT_AMBULATORY_CARE_PROVIDER_SITE_OTHER): Payer: Self-pay | Admitting: *Deleted

## 2014-09-26 ENCOUNTER — Telehealth (INDEPENDENT_AMBULATORY_CARE_PROVIDER_SITE_OTHER): Payer: Self-pay | Admitting: *Deleted

## 2014-09-26 NOTE — Telephone Encounter (Signed)
agree

## 2014-09-26 NOTE — Telephone Encounter (Signed)
Referring MD/PCP: sasser   Procedure: tcs/egd/  Reason/Indication:  Hx polyps, hx achalasia  Has patient had this procedure before?  Yes for both, 2010 -- scanned  If so, when, by whom and where?    Is there a family history of colon cancer?  no  Who?  What age when diagnosed?    Is patient diabetic?   no      Does patient have prosthetic heart valve?  no  Do you have a pacemaker?  no  Has patient ever had endocarditis? no  Has patient had joint replacement within last 12 months?  no  Does patient tend to be constipated or take laxatives? no  Is patient on Coumadin, Plavix and/or Aspirin? no  Medications: see epic  Allergies: nkda  Medication Adjustment:   Procedure date & time: 10/25/14 at 830

## 2014-09-27 DIAGNOSIS — M0609 Rheumatoid arthritis without rheumatoid factor, multiple sites: Secondary | ICD-10-CM | POA: Diagnosis not present

## 2014-10-05 DIAGNOSIS — M79672 Pain in left foot: Secondary | ICD-10-CM | POA: Diagnosis not present

## 2014-10-05 DIAGNOSIS — M25431 Effusion, right wrist: Secondary | ICD-10-CM | POA: Diagnosis not present

## 2014-10-05 DIAGNOSIS — G629 Polyneuropathy, unspecified: Secondary | ICD-10-CM | POA: Diagnosis not present

## 2014-10-05 DIAGNOSIS — M79671 Pain in right foot: Secondary | ICD-10-CM | POA: Diagnosis not present

## 2014-10-05 DIAGNOSIS — Z79899 Other long term (current) drug therapy: Secondary | ICD-10-CM | POA: Diagnosis not present

## 2014-10-05 DIAGNOSIS — F17200 Nicotine dependence, unspecified, uncomplicated: Secondary | ICD-10-CM | POA: Diagnosis not present

## 2014-10-05 DIAGNOSIS — M059 Rheumatoid arthritis with rheumatoid factor, unspecified: Secondary | ICD-10-CM | POA: Diagnosis not present

## 2014-10-17 DIAGNOSIS — M899 Disorder of bone, unspecified: Secondary | ICD-10-CM | POA: Diagnosis not present

## 2014-10-23 ENCOUNTER — Encounter (INDEPENDENT_AMBULATORY_CARE_PROVIDER_SITE_OTHER): Payer: Self-pay | Admitting: Internal Medicine

## 2014-10-25 ENCOUNTER — Encounter (HOSPITAL_COMMUNITY)
Admission: RE | Disposition: A | Discharge status: Home / Self Care | Payer: Self-pay | Source: Ambulatory Visit | Attending: Internal Medicine

## 2014-10-25 ENCOUNTER — Ambulatory Visit (HOSPITAL_COMMUNITY)
Admission: RE | Admit: 2014-10-25 | Discharge: 2014-10-25 | Disposition: A | Discharge status: Home / Self Care | Payer: Medicare Other | Source: Ambulatory Visit | Attending: Internal Medicine | Admitting: Internal Medicine

## 2014-10-25 ENCOUNTER — Encounter (HOSPITAL_COMMUNITY): Payer: Self-pay | Admitting: *Deleted

## 2014-10-25 DIAGNOSIS — Z792 Long term (current) use of antibiotics: Secondary | ICD-10-CM | POA: Diagnosis not present

## 2014-10-25 DIAGNOSIS — D125 Benign neoplasm of sigmoid colon: Secondary | ICD-10-CM | POA: Insufficient documentation

## 2014-10-25 DIAGNOSIS — Z7952 Long term (current) use of systemic steroids: Secondary | ICD-10-CM | POA: Diagnosis not present

## 2014-10-25 DIAGNOSIS — K219 Gastro-esophageal reflux disease without esophagitis: Secondary | ICD-10-CM | POA: Insufficient documentation

## 2014-10-25 DIAGNOSIS — Z09 Encounter for follow-up examination after completed treatment for conditions other than malignant neoplasm: Secondary | ICD-10-CM | POA: Diagnosis present

## 2014-10-25 DIAGNOSIS — Z86010 Personal history of colonic polyps: Secondary | ICD-10-CM | POA: Diagnosis not present

## 2014-10-25 DIAGNOSIS — Z90710 Acquired absence of both cervix and uterus: Secondary | ICD-10-CM | POA: Diagnosis not present

## 2014-10-25 DIAGNOSIS — M069 Rheumatoid arthritis, unspecified: Secondary | ICD-10-CM | POA: Diagnosis not present

## 2014-10-25 DIAGNOSIS — K5730 Diverticulosis of large intestine without perforation or abscess without bleeding: Secondary | ICD-10-CM | POA: Insufficient documentation

## 2014-10-25 DIAGNOSIS — G43909 Migraine, unspecified, not intractable, without status migrainosus: Secondary | ICD-10-CM | POA: Diagnosis not present

## 2014-10-25 DIAGNOSIS — I10 Essential (primary) hypertension: Secondary | ICD-10-CM | POA: Insufficient documentation

## 2014-10-25 DIAGNOSIS — K220 Achalasia of cardia: Secondary | ICD-10-CM | POA: Diagnosis not present

## 2014-10-25 DIAGNOSIS — Z9049 Acquired absence of other specified parts of digestive tract: Secondary | ICD-10-CM | POA: Insufficient documentation

## 2014-10-25 DIAGNOSIS — Z79899 Other long term (current) drug therapy: Secondary | ICD-10-CM | POA: Insufficient documentation

## 2014-10-25 DIAGNOSIS — E039 Hypothyroidism, unspecified: Secondary | ICD-10-CM | POA: Diagnosis not present

## 2014-10-25 DIAGNOSIS — F17210 Nicotine dependence, cigarettes, uncomplicated: Secondary | ICD-10-CM | POA: Insufficient documentation

## 2014-10-25 DIAGNOSIS — K228 Other specified diseases of esophagus: Secondary | ICD-10-CM | POA: Diagnosis not present

## 2014-10-25 DIAGNOSIS — M810 Age-related osteoporosis without current pathological fracture: Secondary | ICD-10-CM | POA: Diagnosis not present

## 2014-10-25 DIAGNOSIS — K222 Esophageal obstruction: Secondary | ICD-10-CM | POA: Diagnosis not present

## 2014-10-25 HISTORY — PX: ESOPHAGOGASTRODUODENOSCOPY: SHX5428

## 2014-10-25 HISTORY — PX: COLONOSCOPY: SHX5424

## 2014-10-25 SURGERY — COLONOSCOPY
Anesthesia: Moderate Sedation

## 2014-10-25 MED ORDER — MEPERIDINE HCL 50 MG/ML IJ SOLN
INTRAMUSCULAR | Status: DC | PRN
  Administered 2014-10-25 (×2): 25 mg

## 2014-10-25 MED ORDER — MEPERIDINE HCL 50 MG/ML IJ SOLN
INTRAMUSCULAR | Status: AC
  Filled 2014-10-25: qty 1

## 2014-10-25 MED ORDER — BUTAMBEN-TETRACAINE-BENZOCAINE 2-2-14 % EX AERO
INHALATION_SPRAY | CUTANEOUS | Status: DC | PRN
  Administered 2014-10-25: 2 via TOPICAL

## 2014-10-25 MED ORDER — MIDAZOLAM HCL 5 MG/5ML IJ SOLN
INTRAMUSCULAR | Status: AC
  Filled 2014-10-25: qty 10

## 2014-10-25 MED ORDER — SODIUM CHLORIDE 0.9 % IV SOLN
INTRAVENOUS | Status: DC
  Administered 2014-10-25: 10:00:00 via INTRAVENOUS

## 2014-10-25 MED ORDER — MIDAZOLAM HCL 5 MG/5ML IJ SOLN
INTRAMUSCULAR | Status: DC | PRN
  Administered 2014-10-25: 2 mg via INTRAVENOUS
  Administered 2014-10-25 (×3): 1 mg via INTRAVENOUS

## 2014-10-25 MED ORDER — SIMETHICONE 40 MG/0.6ML PO SUSP
ORAL | Status: DC | PRN
  Administered 2014-10-25: 10:00:00

## 2014-10-25 NOTE — Discharge Instructions (Signed)
Resume usual medications and high fiber diet. No driving for 24 hours. Physician will call with biopsy results.  Colonoscopy, Care After Refer to this sheet in the next few weeks. These instructions provide you with information on caring for yourself after your procedure. Your health care provider may also give you more specific instructions. Your treatment has been planned according to current medical practices, but problems sometimes occur. Call your health care provider if you have any problems or questions after your procedure. WHAT TO EXPECT AFTER THE PROCEDURE  After your procedure, it is typical to have the following:  A small amount of blood in your stool.  Moderate amounts of gas and mild abdominal cramping or bloating. HOME CARE INSTRUCTIONS  Do not drive, operate machinery, or sign important documents for 24 hours.  You may shower and resume your regular physical activities, but move at a slower pace for the first 24 hours.  Take frequent rest periods for the first 24 hours.  Walk around or put a warm pack on your abdomen to help reduce abdominal cramping and bloating.  Drink enough fluids to keep your urine clear or pale yellow.  You may resume your normal diet as instructed by your health care provider. Avoid heavy or fried foods that are hard to digest.  Avoid drinking alcohol for 24 hours or as instructed by your health care provider.  Only take over-the-counter or prescription medicines as directed by your health care provider.  If a tissue sample (biopsy) was taken during your procedure:  Do not take aspirin or blood thinners for 7 days, or as instructed by your health care provider.  Do not drink alcohol for 7 days, or as instructed by your health care provider.  Eat soft foods for the first 24 hours. SEEK MEDICAL CARE IF: You have persistent spotting of blood in your stool 2-3 days after the procedure. SEEK IMMEDIATE MEDICAL CARE IF:  You have more than a  small spotting of blood in your stool.  You pass large blood clots in your stool.  Your abdomen is swollen (distended).  You have nausea or vomiting.  You have a fever.  You have increasing abdominal pain that is not relieved with medicine. Document Released: 03/04/2004 Document Revised: 05/11/2013 Document Reviewed: 03/28/2013 [facility] Patient Information 2015 [place], [place]. This information is not intended to replace advice given to you by your health care provider. Make sure you discuss any questions you have with your health care provider.   Esophagogastroduodenoscopy Care After Refer to this sheet in the next few weeks. These instructions provide you with information on caring for yourself after your procedure. Your caregiver may also give you more specific instructions. Your treatment has been planned according to current medical practices, but problems sometimes occur. Call your caregiver if you have any problems or questions after your procedure.  HOME CARE INSTRUCTIONS  Do not eat or drink anything until the numbing medicine (local anesthetic) has worn off and your gag reflex has returned. You will know that the local anesthetic has worn off when you can swallow comfortably.  Do not drive for 12 hours after the procedure or as directed by your caregiver.  Only take medicines as directed by your caregiver. SEEK MEDICAL CARE IF:   You cannot stop coughing.  You are not urinating at all or less than usual. SEEK IMMEDIATE MEDICAL CARE IF:  You have difficulty swallowing.  You cannot eat or drink.  You have worsening throat or chest pain.  You have dizziness, lightheadedness, or you faint.  You have nausea or vomiting.  You have chills.  You have a fever.  You have severe abdominal pain.  You have black, tarry, or bloody stools. Document Released: 07/07/2012 Document Reviewed: 07/07/2012 [facility] Patient Information 2015 [place]. This information is not  intended to replace advice given to you by your health care provider. Make sure you discuss any questions you have with your health care provider.  Colon Polyps Polyps are lumps of extra tissue growing inside the body. Polyps can grow in the large intestine (colon). Most colon polyps are noncancerous (benign). However, some colon polyps can become cancerous over time. Polyps that are larger than a pea may be harmful. To be safe, caregivers remove and test all polyps. CAUSES  Polyps form when mutations in the genes cause your cells to grow and divide even though no more tissue is needed. RISK FACTORS There are a number of risk factors that can increase your chances of getting colon polyps. They include:  Being older than 50 years.  Family history of colon polyps or colon cancer.  Long-term colon diseases, such as colitis or Crohn disease.  Being overweight.  Smoking.  Being inactive.  Drinking too much alcohol. SYMPTOMS  Most small polyps do not cause symptoms. If symptoms are present, they may include:  Blood in the stool. The stool may look dark red or black.  Constipation or diarrhea that lasts longer than 1 week. DIAGNOSIS People often do not know they have polyps until their caregiver finds them during a regular checkup. Your caregiver can use 4 tests to check for polyps:  Digital rectal exam. The caregiver wears gloves and feels inside the rectum. This test would find polyps only in the rectum.  Barium enema. The caregiver puts a liquid called barium into your rectum before taking X-rays of your colon. Barium makes your colon look white. Polyps are dark, so they are easy to see in the X-ray pictures.  Sigmoidoscopy. A thin, flexible tube (sigmoidoscope) is placed into your rectum. The sigmoidoscope has a light and tiny camera in it. The caregiver uses the sigmoidoscope to look at the last third of your colon.  Colonoscopy. This test is like sigmoidoscopy, but the caregiver  looks at the entire colon. This is the most common method for finding and removing polyps. TREATMENT  Any polyps will be removed during a sigmoidoscopy or colonoscopy. The polyps are then tested for cancer. PREVENTION  To help lower your risk of getting more colon polyps:  Eat plenty of fruits and vegetables. Avoid eating fatty foods.  Do not smoke.  Avoid drinking alcohol.  Exercise every day.  Lose weight if recommended by your caregiver.  Eat plenty of calcium and folate. Foods that are rich in calcium include milk, cheese, and broccoli. Foods that are rich in folate include chickpeas, kidney beans, and spinach. HOME CARE INSTRUCTIONS Keep all follow-up appointments as directed by your caregiver. You may need periodic exams to check for polyps. SEEK MEDICAL CARE IF: You notice bleeding during a bowel movement. Document Released: 04/16/2004 Document Revised: 10/13/2011 Document Reviewed: 09/30/2011 [facility] Patient Information 2015 [place], [place]. This information is not intended to replace advice given to you by your health care provider. Make sure you discuss any questions you have with your health care provider.   High-Fiber Diet Fiber is found in fruits, vegetables, and grains. A high-fiber diet encourages the addition of more whole grains, legumes, fruits, and vegetables in your  diet. The recommended amount of fiber for adult males is 38 g per day. For adult females, it is 25 g per day. Pregnant and lactating women should get 28 g of fiber per day. If you have a digestive or bowel problem, ask your caregiver for advice before adding high-fiber foods to your diet. Eat a variety of high-fiber foods instead of only a select few type of foods.  PURPOSE  To increase stool bulk.  To make bowel movements more regular to prevent constipation.  To lower cholesterol.  To prevent overeating. WHEN IS THIS DIET USED?  It may be used if you have constipation and hemorrhoids.  It may  be used if you have uncomplicated diverticulosis (intestine condition) and irritable bowel syndrome.  It may be used if you need help with weight management.  It may be used if you want to add it to your diet as a protective measure against atherosclerosis, diabetes, and cancer. SOURCES OF FIBER  Whole-grain breads and cereals.  Fruits, such as apples, oranges, bananas, berries, prunes, and pears.  Vegetables, such as green peas, carrots, sweet potatoes, beets, broccoli, cabbage, spinach, and artichokes.  Legumes, such split peas, soy, lentils.  Almonds. FIBER CONTENT IN FOODS Starches and Grains / Dietary Fiber (g)  Cheerios, 1 cup / 3 g  Corn Flakes cereal, 1 cup / 0.7 g  Rice crispy treat cereal, 1 cup / 0.3 g  Instant oatmeal (cooked),  cup / 2 g  Frosted wheat cereal, 1 cup / 5.1 g  Brown, long-grain rice (cooked), 1 cup / 3.5 g  White, long-grain rice (cooked), 1 cup / 0.6 g  Enriched macaroni (cooked), 1 cup / 2.5 g Legumes / Dietary Fiber (g)  Baked beans (canned, plain, or vegetarian),  cup / 5.2 g  Kidney beans (canned),  cup / 6.8 g  Pinto beans (cooked),  cup / 5.5 g Breads and Crackers / Dietary Fiber (g)  Plain or honey graham crackers, 2 squares / 0.7 g  Saltine crackers, 3 squares / 0.3 g  Plain, salted pretzels, 10 pieces / 1.8 g  Whole-wheat bread, 1 slice / 1.9 g  White bread, 1 slice / 0.7 g  Raisin bread, 1 slice / 1.2 g  Plain bagel, 3 oz / 2 g  Flour tortilla, 1 oz / 0.9 g  Corn tortilla, 1 small / 1.5 g  Hamburger or hotdog bun, 1 small / 0.9 g Fruits / Dietary Fiber (g)  Apple with skin, 1 medium / 4.4 g  Sweetened applesauce,  cup / 1.5 g  Banana,  medium / 1.5 g  Grapes, 10 grapes / 0.4 g  Orange, 1 small / 2.3 g  Raisin, 1.5 oz / 1.6 g  Melon, 1 cup / 1.4 g Vegetables / Dietary Fiber (g)  Green beans (canned),  cup / 1.3 g  Carrots (cooked),  cup / 2.3 g  Broccoli (cooked),  cup / 2.8 g  Peas  (cooked),  cup / 4.4 g  Mashed potatoes,  cup / 1.6 g  Lettuce, 1 cup / 0.5 g  Corn (canned),  cup / 1.6 g  Tomato,  cup / 1.1 g Document Released: 07/21/2005 Document Revised: 01/20/2012 Document Reviewed: 10/23/2011 [employer] Patient Information 2015 [place], [place]. This information is not intended to replace advice given to you by your health care provider. Make sure you discuss any questions you have with your health care provider.

## 2014-10-25 NOTE — Op Note (Signed)
EGD PROCEDURE REPORT  PATIENT:  Megan Daniel  MR#:  [id number] Birthdate:  [birth year], 79 y.o., female Endoscopist:  Dr. Rogene Houston, MD  Procedure Date: 10/25/2014  Procedure:   EGD & Colonoscopy  Indications: Patient is 29 old Caucasian female with history of achalasia status post pneumatic dilation in 1994 who is here for surveillance EGD. She also has history of colonic adenomas and is here for surveillance colonoscopy. Family history is negative for CRC. Last EGD and colonoscopy were in February 2010.            Informed Consent:  The risks, benefits, alternatives & imponderables which include, but are not limited to, bleeding, infection, perforation, drug reaction and potential missed lesion have been reviewed.  The potential for biopsy, lesion removal, esophageal dilation, etc. have also been discussed.  Questions have been answered.  All parties agreeable.  Please see history & physical in medical record for more information.  Medications:  Demerol 50 mg IV Versed 5 mg IV Cetacaine spray topically for oropharyngeal anesthesia  EGD  Description of procedure:  The endoscope was introduced through the mouth and advanced to the second portion of the duodenum without difficulty or limitations. The mucosal surfaces were surveyed very carefully during advancement of the scope and upon withdrawal.  Findings:  Esophagus:  Mucosa of the esophagus was normal. Somewhat dilated esophageal body but no food debris noted. GE junction unremarkable and no resistance noted as the scope passed across it. GEJ:  40 cm Stomach:  Stomach was empty and distended very well with insufflation. Folds in the proximal stomach were normal. Examination of mucosa and gastric body, antrum, pyloric channel, angularis fundus and cardia was normal. Duodenum:  Normal bulbar and post bulbar mucosa.  Therapeutic/Diagnostic Maneuvers Performed:  None  COLONOSCOPY Description of procedure:  After a digital  rectal exam was performed, that colonoscope was advanced from the anus through the rectum and colon to the area of the cecum, ileocecal valve and appendiceal orifice. The cecum was deeply intubated. These structures were well-seen and photographed for the record. From the level of the cecum and ileocecal valve, the scope was slowly and cautiously withdrawn. The mucosal surfaces were carefully surveyed utilizing scope tip to flexion to facilitate fold flattening as needed. The scope was pulled down into the rectum where a thorough exam including retroflexion was performed.  Findings:   Prep excellent. Multiple diverticula noted at sigmoid colon. Small polyp cold snared from distal sigmoid colon. Normal rectal mucosa except scarring above the dentate line from previous hemorrhoidectomy.  Therapeutic/Diagnostic Maneuvers Performed:  None  Complications:  None  Cecal Withdrawal Time:  16  minutes  Impression:  EGD findings; Somewhat dilated esophageal body otherwise normal EGD.  Colonoscopy findings; Examination performed to cecum. Multiple diverticula at sigmoid colon. Small polyp cold snared from distal sigmoid colon. Focal scarring above the dentate line from previous hemorrhoidectomy.  Recommendations:  Standard instructions given. I will be contacting patient with biopsy results.  Samika Vetsch U  10/25/2014 10:34 AM  CC: Dr. Manon Hilding, MD & Dr. Rayne Du ref. provider found

## 2014-10-25 NOTE — H&P (Signed)
Megan Daniel is an 79 y.o. female.   Chief Complaint: Patient is here for EGD and colonoscopy. HPI: Patient is 79 year old Caucasian female was history of achalasia and colonic adenomas and is here for surveillance exams. She denies dysphagia. She has intermittent heartburn for which she uses Prilosec on as-needed basis. She denies abdominal pain change in bowel habits or rectal bleeding. Last EGD and colonoscopy was in 2010. Family history is negative for CRC.  Past Medical History  Diagnosis Date  . Hypertension   . Hyperchloremia   . Diverticulitis   . GERD (gastroesophageal reflux disease)   . [facility] spotted fever)   . Headache   . Fatigue   . Arthralgia 03/31/2014  . Allergic rhinitis   . Hypercholesterolemia   . Osteoporosis   . Abdominal pain   . Anxiety 10/03/2013  . Bronchitis 09/09/2013  . Cellulitis   . Headache   . Lumbar strain   . Hypothyroidism   . Rheumatoid arthritis   . Migraine     Past Surgical History  Procedure Laterality Date  . Total abdominal hysterectomy      Age 54  . Cholecystectomy      20 yrs ago  . Colonoscopy    . Hemorroidectomy      2005  . Polypectomy    . Upper gastrointestinal endoscopy    . Carpal tunnel release      25 years ago    Family History  Problem Relation Age of Onset  . Alzheimer's disease Father   . Alzheimer's disease Sister   . Macular degeneration Sister   . Healthy Brother   . Arthritis Sister   . Arthritis Sister   . COPD Sister   . Heart disease Mother   . Seizures Sister   . Seizures Other     nephew  . Alcohol abuse Sister    Social History:  reports that she has been smoking Cigarettes.  She has been smoking about 0.50 packs per day. She has never used smokeless tobacco. She reports that she drinks alcohol. She reports that she does not use illicit drugs.  Allergies:  Allergies  Allergen Reactions  . Ibuprofen Other (See Comments)    heartburn    Medications Prior to  Admission  Medication Sig Dispense Refill  . acetaminophen (TYLENOL) 325 MG tablet Take 325 mg by mouth every 6 (six) hours as needed.     . Calcium Carb-Cholecalciferol (CALCIUM 500 +D PO) Take 2 tablets by mouth daily.    . colchicine 0.6 MG tablet Take 0.6 mg by mouth daily.    . fexofenadine (ALLEGRA) 180 MG tablet Take 180 mg by mouth as needed for allergies or rhinitis.    Marland Kitchen levothyroxine (SYNTHROID, LEVOTHROID) 100 MCG tablet Take 100 mcg by mouth daily before breakfast.    . Melatonin 5 MG TABS Take 5 mg by mouth as needed (sleep).     . Multiple Vitamins-Minerals (PRESERVISION/LUTEIN PO) Take by mouth daily.    Marland Kitchen omeprazole (PRILOSEC OTC) 20 MG tablet Take 20 mg by mouth daily as needed (acid reflux).     . pimecrolimus (ELIDEL) 1 % cream Apply 1 application topically daily.    . polyethylene glycol-electrolytes (NULYTELY/GOLYTELY) 420 G solution Take 4,000 mLs by mouth once. 4000 mL 0  . predniSONE (DELTASONE) 10 MG tablet Take 10 mg by mouth daily.    . verapamil (CALAN-SR) 120 MG CR tablet Take 1 tablet (120 mg total) by mouth at bedtime. 30 tablet  6  . azithromycin (ZITHROMAX Z-PAK) 250 MG tablet Take 2 tabes the first day then take 1 tab daily for 4 days. (Patient not taking: Reported on 10/16/2014) 6 each 0  . estradiol (ESTRACE) 0.1 MG/GM vaginal cream Place 1 Applicatorful vaginally daily.    . methylPREDNISolone (MEDROL DOSEPAK) 4 MG tablet follow package directions (Patient not taking: Reported on 10/16/2014) 21 tablet 0    No results found for this or any previous visit (from the past 48 hour(s)). No results found.  ROS  Blood pressure 121/81, pulse 54, temperature 97.5 F (36.4 C), temperature source Oral, resp. rate 16, SpO2 100 %. Physical Exam  Constitutional: She appears well-developed and well-nourished.  HENT:  Mouth/Throat: Oropharynx is clear and moist.  Eyes: Conjunctivae are normal.  Neck: No thyromegaly present.  Cardiovascular: Normal rate, regular  rhythm and normal heart sounds.   No murmur heard. Respiratory: Effort normal and breath sounds normal.  GI: Soft. She exhibits no distension and no mass. There is no tenderness.  Musculoskeletal: She exhibits no edema.  Lymphadenopathy:    She has no cervical adenopathy.  Neurological: She is alert.  Skin: Skin is warm and dry.     Assessment/Plan History of achalasia and colonic adenomas. Surveillance EGD and colonoscopy.  REHMAN,NAJEEB U 10/25/2014, 9:42 AM

## 2014-10-26 ENCOUNTER — Encounter (HOSPITAL_COMMUNITY): Payer: Self-pay | Admitting: Internal Medicine

## 2014-10-26 DIAGNOSIS — S80862A Insect bite (nonvenomous), left lower leg, initial encounter: Secondary | ICD-10-CM | POA: Diagnosis not present

## 2014-10-26 DIAGNOSIS — S30861A Insect bite (nonvenomous) of abdominal wall, initial encounter: Secondary | ICD-10-CM | POA: Diagnosis not present

## 2014-10-30 ENCOUNTER — Encounter (INDEPENDENT_AMBULATORY_CARE_PROVIDER_SITE_OTHER): Payer: Self-pay | Admitting: *Deleted

## 2014-11-07 DIAGNOSIS — M25441 Effusion, right hand: Secondary | ICD-10-CM | POA: Diagnosis not present

## 2014-11-07 DIAGNOSIS — M109 Gout, unspecified: Secondary | ICD-10-CM | POA: Diagnosis not present

## 2014-11-07 DIAGNOSIS — M069 Rheumatoid arthritis, unspecified: Secondary | ICD-10-CM | POA: Diagnosis not present

## 2014-11-07 DIAGNOSIS — Z79899 Other long term (current) drug therapy: Secondary | ICD-10-CM | POA: Diagnosis not present

## 2014-11-27 DIAGNOSIS — E780 Pure hypercholesterolemia: Secondary | ICD-10-CM | POA: Diagnosis not present

## 2014-11-27 DIAGNOSIS — E039 Hypothyroidism, unspecified: Secondary | ICD-10-CM | POA: Diagnosis not present

## 2014-11-27 DIAGNOSIS — I10 Essential (primary) hypertension: Secondary | ICD-10-CM | POA: Diagnosis not present

## 2014-12-04 DIAGNOSIS — R51 Headache: Secondary | ICD-10-CM | POA: Diagnosis not present

## 2014-12-04 DIAGNOSIS — E780 Pure hypercholesterolemia: Secondary | ICD-10-CM | POA: Diagnosis not present

## 2014-12-04 DIAGNOSIS — M069 Rheumatoid arthritis, unspecified: Secondary | ICD-10-CM | POA: Diagnosis not present

## 2014-12-04 DIAGNOSIS — I10 Essential (primary) hypertension: Secondary | ICD-10-CM | POA: Diagnosis not present

## 2014-12-04 DIAGNOSIS — M109 Gout, unspecified: Secondary | ICD-10-CM | POA: Diagnosis not present

## 2014-12-04 DIAGNOSIS — R5382 Chronic fatigue, unspecified: Secondary | ICD-10-CM | POA: Diagnosis not present

## 2014-12-04 DIAGNOSIS — Z1389 Encounter for screening for other disorder: Secondary | ICD-10-CM | POA: Diagnosis not present

## 2014-12-13 ENCOUNTER — Other Ambulatory Visit: Payer: Self-pay | Admitting: Neurology

## 2015-01-04 DIAGNOSIS — M109 Gout, unspecified: Secondary | ICD-10-CM | POA: Diagnosis not present

## 2015-01-04 DIAGNOSIS — M059 Rheumatoid arthritis with rheumatoid factor, unspecified: Secondary | ICD-10-CM | POA: Diagnosis not present

## 2015-01-04 DIAGNOSIS — Z79899 Other long term (current) drug therapy: Secondary | ICD-10-CM | POA: Diagnosis not present

## 2015-01-16 ENCOUNTER — Other Ambulatory Visit: Payer: Self-pay | Admitting: Neurology

## 2015-01-29 ENCOUNTER — Other Ambulatory Visit: Payer: Self-pay

## 2015-01-30 ENCOUNTER — Encounter (INDEPENDENT_AMBULATORY_CARE_PROVIDER_SITE_OTHER): Payer: Self-pay | Admitting: Internal Medicine

## 2015-01-30 ENCOUNTER — Ambulatory Visit (INDEPENDENT_AMBULATORY_CARE_PROVIDER_SITE_OTHER): Payer: Medicare Other | Admitting: Internal Medicine

## 2015-01-30 VITALS — BP 132/52 | HR 72 | Temp 98.0°F | Ht 66.0 in | Wt 143.1 lb

## 2015-01-30 DIAGNOSIS — K2970 Gastritis, unspecified, without bleeding: Secondary | ICD-10-CM | POA: Diagnosis not present

## 2015-01-30 DIAGNOSIS — K219 Gastro-esophageal reflux disease without esophagitis: Secondary | ICD-10-CM

## 2015-01-30 NOTE — Patient Instructions (Signed)
May take the Fosamax. If any problems stop the Fosamax.

## 2015-01-30 NOTE — Progress Notes (Signed)
Subjective:    [name], female    DOB: 10-Apr-1936, 79 y.o.   MRN: [id number]   HPI She became sick at the beach. She had heart burn and epigastric pain. She occasionally takes Gas X.  She said she had bloating in her abdomen and had to sit up to get a deep breath. She says she hurt all the way home. She says she was diagnosed with osteoporosis and she is suppose to take Fosamax 70mg  but has not taking. She wants to take an injection for her osteoporosis.  She says the Colchicine and Prednisone have caused her some heart burn. She will be off the Prednisone in 3 days.  She says she is better since backing off the Colchicine Her appetite is good. No abdominal pain now. Stools are brown in color.  She has seen Dr. Quintin Daniel and was told she had Osteoporosis and wants her to start Fosamax. She wants Dr. Olevia Daniel opinion.   EGD/Colonoscopy 10/25/2014 Indications: Patient is 52 old Caucasian female with history of achalasia status post pneumatic dilation in 1994 who is here for surveillance EGD. She also has history of colonic adenomas and is here for surveillance colonoscopy. Family history is negative for CRC. Last EGD and colonoscopy were in February 2010.   Impression:  EGD findings; Somewhat dilated esophageal body otherwise normal EGD.  Colonoscopy findings; Examination performed to cecum. Multiple diverticula at sigmoid colon. Small polyp cold snared from distal sigmoid colon. Focal scarring above the dentate line from previous hemorrhoidectomy. Biopsy: Sigmoid colon polyp is hyperplastic. Results reviewed with patient. Future colonoscopies were left to be diagnostic and not for screening or surveillance  Review of Systems Past Medical History  Diagnosis Date  . Hypertension   . Hyperchloremia   . Diverticulitis   . GERD (gastroesophageal  reflux disease)   . Headache   . Fatigue   . Arthralgia 03/31/2014  . Allergic rhinitis   . Hypercholesterolemia   . Osteoporosis   . Abdominal pain   . Anxiety 10/03/2013  . Bronchitis 09/09/2013  . Cellulitis   . Headache   . Lumbar strain   . Hypothyroidism   . Rheumatoid arthritis   . Migraine     Past Surgical History  Procedure Laterality Date  . Total abdominal hysterectomy      Age 51  . Cholecystectomy      20 yrs ago  . Colonoscopy    . Hemorroidectomy      2005  . Polypectomy    . Upper gastrointestinal endoscopy    . Carpal tunnel release      25 years ago  . Colonoscopy N/A 10/25/2014    Procedure: COLONOSCOPY;  Surgeon: Rogene Houston, MD;  Location: [facility];  Service: Endoscopy;  Laterality: N/A;  830  . Esophagogastroduodenoscopy N/A 10/25/2014    Procedure: ESOPHAGOGASTRODUODENOSCOPY (EGD);  Surgeon: Rogene Houston, MD;  Location: [facility];  Service: Endoscopy;  Laterality: N/A;    Allergies  Allergen Reactions  . Ibuprofen Other (See Comments)    heartburn    Current Outpatient Prescriptions on File Prior to Visit  Medication Sig Dispense Refill  . Calcium Carb-Cholecalciferol (CALCIUM 500 +D PO) Take 2 tablets by mouth daily.    . colchicine 0.6 MG tablet Take 54 mg by mouth daily.     Marland Kitchen estradiol (ESTRACE) 0.1 MG/GM vaginal cream Place 1 Applicatorful vaginally daily.    . fexofenadine (ALLEGRA) 180 MG tablet Take 180 mg by mouth  as needed for allergies or rhinitis.    Marland Kitchen levothyroxine (SYNTHROID, LEVOTHROID) 100 MCG tablet Take 100 mcg by mouth daily before breakfast.    . Melatonin 5 MG TABS Take 5 mg by mouth as needed (sleep).     . Multiple Vitamins-Minerals (PRESERVISION/LUTEIN PO) Take by mouth daily.    Marland Kitchen omeprazole (PRILOSEC OTC) 20 MG tablet Take 20 mg by mouth daily as needed (acid reflux).     . pimecrolimus (ELIDEL) 1 % cream Apply 1 application topically daily.    . predniSONE (DELTASONE) 10 MG tablet Take 10 mg by  mouth daily.    . verapamil (CALAN-SR) 120 MG CR tablet TAKE 1 TABLET BY MOUTH AT BEDTIME 30 tablet 0   No current facility-administered medications on file prior to visit.        Objective:   Physical Exam Blood pressure 132/52, pulse 72, temperature 98 F (36.7 C), height 5\' 6"  (1.676 m), weight 143 lb 1.6 oz (64.91 kg). Alert and oriented. Skin warm and dry. Oral mucosa is moist.   . Sclera anicteric, conjunctivae is pink. Thyroid not enlarged. No cervical lymphadenopathy. Lungs clear. Heart regular rate and rhythm.  Abdomen is soft. Bowel sounds are positive. No hepatomegaly. No abdominal masses felt. No tenderness.  No edema to lower extremities.         Assessment & Plan:  GERD. Possible gastritis. Somewhat better now since backing off the Colchicine. Has been on Prednisone and Colchicine for gout.  Start the Fosamax after finishing the Colchicine. If any problems with the Fosamax, stop and let Dr. Quintin Daniel know.  OV as needed.

## 2015-02-06 DIAGNOSIS — J301 Allergic rhinitis due to pollen: Secondary | ICD-10-CM | POA: Diagnosis not present

## 2015-02-06 DIAGNOSIS — K5732 Diverticulitis of large intestine without perforation or abscess without bleeding: Secondary | ICD-10-CM | POA: Diagnosis not present

## 2015-02-07 ENCOUNTER — Ambulatory Visit: Payer: Self-pay | Admitting: Neurology

## 2015-02-12 ENCOUNTER — Ambulatory Visit (INDEPENDENT_AMBULATORY_CARE_PROVIDER_SITE_OTHER): Payer: Medicare Other | Admitting: Neurology

## 2015-02-12 VITALS — BP 135/66 | HR 76 | Ht 66.0 in | Wt 150.8 lb

## 2015-02-12 DIAGNOSIS — E519 Thiamine deficiency, unspecified: Secondary | ICD-10-CM | POA: Diagnosis not present

## 2015-02-12 DIAGNOSIS — G441 Vascular headache, not elsewhere classified: Secondary | ICD-10-CM

## 2015-02-12 DIAGNOSIS — E531 Pyridoxine deficiency: Secondary | ICD-10-CM

## 2015-02-12 DIAGNOSIS — E559 Vitamin D deficiency, unspecified: Secondary | ICD-10-CM

## 2015-02-12 DIAGNOSIS — G609 Hereditary and idiopathic neuropathy, unspecified: Secondary | ICD-10-CM | POA: Diagnosis not present

## 2015-02-12 DIAGNOSIS — E538 Deficiency of other specified B group vitamins: Secondary | ICD-10-CM | POA: Diagnosis not present

## 2015-02-12 MED ORDER — VERAPAMIL HCL ER 120 MG PO TBCR: 120.0000 mg | EXTENDED_RELEASE_TABLET | Freq: Every day | ORAL | Status: DC

## 2015-02-12 MED ORDER — TRAMADOL HCL 50 MG PO TABS: 50.0000 mg | ORAL_TABLET | Freq: Four times a day (QID) | ORAL | Status: DC | PRN

## 2015-02-12 NOTE — Progress Notes (Signed)
[facility]    Provider:  Dr Jaynee Eagles Referring Provider: Manon Hilding, MD Primary Care Physician:  Manon Hilding, MD  Provider: Dr Jaynee Eagles Referring Provider: Manon Hilding, MD Primary Care Physician: Manon Hilding, MD  CC: Headaches  HPI: Megan Daniel is a 79 y.o. female here as a follow up for headaches  Interval history 02/11/2014: She is feeling well. She has been on steroids for her RA.She stopped Methotrexate.  No headaches. She is still on the verapamil. She takes tramadol prn. She has some chronic neuropathy that is mild.   Interval History 07/05/2014: Her headaches are improved. No real headaches since taking the prednisone and starting verapamil. She gets them possibly every once in a while, but mild. Her Verapamil pills changed colors but she is feeling the same, still helping. No side effects from the Verapamil. She is on methotrexate for Rheumatoid Arthritis and it is helping. She sees Dr. Amil Amen. Her joint pain was terrible. Her joint pains are better after a month of methotrexate. She still reports paresthesias in her feet.   Reviewed notes, labs and imaging from outside physicians, which showed: Labs in Dr Valora Piccolo office recently showed CMP/CBC unremarkable, Hepatitis panel negative. CCP Ab negative. CRP 11, high.   Previous Appointment [id number]:   79 year old female who is here for evaluation of headaches. This summer started having headaches daily. Wakes up in the morning and it starts hurting in the right frontal/parietal areas. Can't describe the pain, "just hurts". Tylenol will make it feel better. Bright lights make it worse or loud noises make it worse too. Takes tylenol daily which usually takes care of it but may start up again later in the day. She covers up the left eye and she feels better. Ice pack didn't help. 3 weeks ago started having chills and low grade fever and aching. Has Joint pain. RF+, ANA negative, sed rate normal but  primary care is sending patient to rheumatology for evaluation. Recently diagnosed with lyme disease and took antibiotics. No rash. No tick seen. But had a bite and was given doxycycline. Has not had imaging of the brain. Lyme test was negative but had taken antibitoics before the bloodwork. Has had several insect and tick bites. The joint pain is migrating. Feels like her face is assymmetric, one side of her face is more droopy. Has pain in the hands, hips, hands, fingers, knees but not all at the same time. No pain in the shoulders. +fatigue, is so tiredthat even holding onto the steering wheel makes her fatigued. 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 No vision changes, no pain in the temples, no jaw claudication. Has paresthesias in her feet, feet feel hot and she has to put a cool washcloth on them and sleep outside the covers. Headaches can be severe, 7/10.    Review of Systems: Patient complains of symptoms per HPI as well as the following symptoms: Runny nose, cough, urgency, daytime sleepiness, numbness Pertinent negatives per HPI. All others negative.   History   Social History  . Marital Status: Divorced    Spouse Name: N/A  . Number of Children: 0  . Years of Education: RN   Occupational History  . Not on file.   Social History Main Topics  . Smoking status: Current Every Day Smoker -- 0.50 packs/day    Types: Cigarettes  . Smokeless tobacco: Never Used  Comment: Quit 16 days ago after smoking for off and on for years  . Alcohol Use: 0.0 oz/week    0 Standard drinks or equivalent per week     Comment: Drinks wine  . Drug Use: No  . Sexual Activity: Not on file   Other Topics Concern  . Not on file   Social History Narrative   Patient is divorced, no children.   Patient is a retired Equities trader.   Patient is right handed.   Patient drinks 2 cups daily.    Family History  Problem Relation  Age of Onset  . Alzheimer's disease Father   . Alzheimer's disease Sister   . Macular degeneration Sister   . Healthy Brother   . Arthritis Sister   . Arthritis Sister   . COPD Sister   . Heart disease Mother   . Seizures Sister   . Seizures Other     nephew  . Liver cancer Other   . Alcohol abuse Sister     Past Medical History  Diagnosis Date  . Hypertension   . Hyperchloremia   . Diverticulitis   . GERD (gastroesophageal reflux disease)   . Headache   . Fatigue   . Arthralgia 03/31/2014  . Allergic rhinitis   . Hypercholesterolemia   . Osteoporosis   . Abdominal pain   . Anxiety 10/03/2013  . Bronchitis 09/09/2013  . Cellulitis   . Headache   . Lumbar strain   . Hypothyroidism   . Rheumatoid arthritis   . Migraine     Past Surgical History  Procedure Laterality Date  . Total abdominal hysterectomy      Age 10  . Cholecystectomy      20 yrs ago  . Colonoscopy    . Hemorroidectomy      2005  . Polypectomy    . Upper gastrointestinal endoscopy    . Carpal tunnel release      25 years ago  . Colonoscopy N/A 10/25/2014    Procedure: COLONOSCOPY;  Surgeon: Rogene Houston, MD;  Location: [facility];  Service: Endoscopy;  Laterality: N/A;  830  . Esophagogastroduodenoscopy N/A 10/25/2014    Procedure: ESOPHAGOGASTRODUODENOSCOPY (EGD);  Surgeon: Rogene Houston, MD;  Location: [facility];  Service: Endoscopy;  Laterality: N/A;    Current Outpatient Prescriptions  Medication Sig Dispense Refill  . allopurinol (ZYLOPRIM) 300 MG tablet Take 150 mg by mouth daily.    . Calcium Carb-Cholecalciferol (CALCIUM 500 +D PO) Take 2 tablets by mouth daily.    . colchicine 0.6 MG tablet Take 54 mg by mouth daily.     Marland Kitchen estradiol (ESTRACE) 0.1 MG/GM vaginal cream Place 1 Applicatorful vaginally daily.    . fexofenadine (ALLEGRA) 180 MG tablet Take 180 mg by mouth as needed for allergies or rhinitis.    Marland Kitchen levothyroxine (SYNTHROID, LEVOTHROID) 100 MCG tablet Take 100  mcg by mouth daily before breakfast.    . Melatonin 5 MG TABS Take 5 mg by mouth as needed (sleep).     . Multiple Vitamins-Minerals (PRESERVISION/LUTEIN PO) Take by mouth daily.    Marland Kitchen omeprazole (PRILOSEC OTC) 20 MG tablet Take 20 mg by mouth daily as needed (acid reflux).     . pimecrolimus (ELIDEL) 1 % cream Apply 1 application topically daily.    . predniSONE (DELTASONE) 10 MG tablet Take 10 mg by mouth daily.    . traMADol (ULTRAM) 50 MG tablet Take by mouth every 6 (six) hours as needed.    Marland Kitchen  verapamil (CALAN-SR) 120 MG CR tablet TAKE 1 TABLET BY MOUTH AT BEDTIME 30 tablet 0   No current facility-administered medications for this visit.    Allergies as of 02/12/2015 - Review Complete 01/30/2015  Allergen Reaction Noted  . Ibuprofen Other (See Comments) 10/16/2014    Vitals: There were no vitals taken for this visit. Last Weight:  Wt Readings from Last 1 Encounters:  01/30/15 143 lb 1.6 oz (64.91 kg)   Last Height:   Ht Readings from Last 1 Encounters:  01/30/15 5\' 6"  (1.676 m)         Cranial Nerves:  The pupils are equal, round, and reactive to light. The fundi are normal and spontaneous venous pulsations are present. Visual fields are full to finger confrontation. Extraocular movements are intact. Trigeminal sensation is intact and the muscles of mastication are normal. Possibly some right nasolabial flattening. The palate elevates in the midline. Voice is normal. Shoulder shrug is normal. The tongue has normal motion without fasciculations.   Coordination:  Normal finger to nose and heel to shin. Normal rapid alternating movements.   Gait:  Heel-toe and tandem gait are normal.   Motor Observation:  No asymmetry, no atrophy, and no involuntary movements noted. Tone:  Normal muscle tone.   Posture:  Posture is normal. normal erect   Strength:  Strength is V/V in the upper and lower limbs.   Sensory: Decreased vibration, temp, pp in the lower  extremities  Reflex Exam:  DTR's:  Deep tendon reflexes in the upper and lower extremities are normal bilaterally.  Toes:  The toes are downgoing bilaterally.  Clonus:  Clonus is absent.  Assessment/Plan: 63 y ear old female here for follow up of severe, worsening headaches now resolved. Has some migrainous features. Improved with medrol dose pack and Verapamil. Still reports paresthesias in her feet, stable.   Patient reported lyme disease and multiple tick bites and migratory joint pain, possible facial asymmetry and fatigue which prompted LP which was normal (neg lyme) and she was subsequently diagnosed with RA and is now on methotrexate and seeing Dr. Amil Amen. MRi of the brain unremarkable. Neuropathy panel showed HgbA1c 6.1,B12 nml. Neuro exam shows distal decrease in vibration, temp, pp in the lower extremities and emg/ncs showed mild sensory axonal polyneuropathy.  Continue Verapamil.    [name], MD  [facility] [address] [place], [place] [address]  Phone [phone] Fax [phone]  A total of 15 minutes was spent face-to-face with this patient. Over half this time was spent on counseling patient on the headache and neuropathy diagnosis and different diagnostic and therapeutic options available.

## 2015-02-14 LAB — COMPREHENSIVE METABOLIC PANEL
ALBUMIN: 4.3 g/dL (ref 3.5–4.8)
ALT: 28 IU/L (ref 0–32)
AST: 30 IU/L (ref 0–40)
Albumin/Globulin Ratio: 1.9 (ref 1.1–2.5)
Alkaline Phosphatase: 74 IU/L (ref 39–117)
BUN/Creatinine Ratio: 16 (ref 11–26)
BUN: 12 mg/dL (ref 8–27)
CO2: 25 mmol/L (ref 18–29)
Calcium: 10.5 mg/dL — ABNORMAL HIGH (ref 8.7–10.3)
Chloride: 102 mmol/L (ref 97–108)
Creatinine, Ser: 0.74 mg/dL (ref 0.57–1.00)
GFR calc Af Amer: 90 mL/min/{1.73_m2} (ref >59)
GFR, EST NON AFRICAN AMERICAN: 78 mL/min/{1.73_m2} (ref >59)
GLUCOSE: 87 mg/dL (ref 65–99)
Globulin, Total: 2.3 g/dL (ref 1.5–4.5)
Potassium: 5.3 mmol/L — ABNORMAL HIGH (ref 3.5–5.2)
Sodium: 142 mmol/L (ref 134–144)
Total Protein: 6.6 g/dL (ref 6.0–8.5)

## 2015-02-14 LAB — B12 AND FOLATE PANEL
Folate: 20 ng/mL (ref >3.0)
VITAMIN B 12: 820 pg/mL (ref 211–946)

## 2015-02-14 LAB — VITAMIN B1: Thiamine: 139.1 nmol/L (ref 66.5–200.0)

## 2015-02-14 LAB — VITAMIN D PNL(25-HYDRXY+1,25-DIHY)-BLD
VIT D 1 25 DIHYDROXY: 56.5 pg/mL (ref 19.9–79.3)
Vit D, 25-Hydroxy: 35.3 ng/mL (ref 30.0–100.0)

## 2015-02-14 LAB — VITAMIN B6: Vitamin B6: 12.8 ug/L (ref 2.0–32.8)

## 2015-02-15 ENCOUNTER — Telehealth: Payer: Self-pay

## 2015-02-15 NOTE — Telephone Encounter (Signed)
-----   Message from Melvenia Beam, MD sent at 02/14/2015  6:35 PM EDT ----- Let patient know her labs were unremarkabke thanks

## 2015-02-15 NOTE — Telephone Encounter (Signed)
I spoke to patient and she is aware of results and voices understanding.  

## 2015-02-16 ENCOUNTER — Encounter: Payer: Self-pay | Admitting: Neurology

## 2015-02-19 DIAGNOSIS — M109 Gout, unspecified: Secondary | ICD-10-CM | POA: Diagnosis not present

## 2015-02-19 DIAGNOSIS — M25441 Effusion, right hand: Secondary | ICD-10-CM | POA: Diagnosis not present

## 2015-02-19 DIAGNOSIS — M25442 Effusion, left hand: Secondary | ICD-10-CM | POA: Diagnosis not present

## 2015-02-19 DIAGNOSIS — Z79899 Other long term (current) drug therapy: Secondary | ICD-10-CM | POA: Diagnosis not present

## 2015-03-29 DIAGNOSIS — I10 Essential (primary) hypertension: Secondary | ICD-10-CM | POA: Diagnosis not present

## 2015-03-29 DIAGNOSIS — E780 Pure hypercholesterolemia: Secondary | ICD-10-CM | POA: Diagnosis not present

## 2015-04-10 DIAGNOSIS — Z23 Encounter for immunization: Secondary | ICD-10-CM | POA: Diagnosis not present

## 2015-04-10 DIAGNOSIS — M109 Gout, unspecified: Secondary | ICD-10-CM | POA: Diagnosis not present

## 2015-04-10 DIAGNOSIS — M0609 Rheumatoid arthritis without rheumatoid factor, multiple sites: Secondary | ICD-10-CM | POA: Diagnosis not present

## 2015-04-10 DIAGNOSIS — G629 Polyneuropathy, unspecified: Secondary | ICD-10-CM | POA: Diagnosis not present

## 2015-04-10 DIAGNOSIS — R7301 Impaired fasting glucose: Secondary | ICD-10-CM | POA: Diagnosis not present

## 2015-04-10 DIAGNOSIS — E780 Pure hypercholesterolemia: Secondary | ICD-10-CM | POA: Diagnosis not present

## 2015-04-10 DIAGNOSIS — E039 Hypothyroidism, unspecified: Secondary | ICD-10-CM | POA: Diagnosis not present

## 2015-04-10 DIAGNOSIS — R51 Headache: Secondary | ICD-10-CM | POA: Diagnosis not present

## 2015-04-10 DIAGNOSIS — I10 Essential (primary) hypertension: Secondary | ICD-10-CM | POA: Diagnosis not present

## 2015-04-23 DIAGNOSIS — M109 Gout, unspecified: Secondary | ICD-10-CM | POA: Diagnosis not present

## 2015-04-23 DIAGNOSIS — M818 Other osteoporosis without current pathological fracture: Secondary | ICD-10-CM | POA: Diagnosis not present

## 2015-04-23 DIAGNOSIS — I10 Essential (primary) hypertension: Secondary | ICD-10-CM | POA: Diagnosis not present

## 2015-04-27 DIAGNOSIS — Z79899 Other long term (current) drug therapy: Secondary | ICD-10-CM | POA: Diagnosis not present

## 2015-04-27 DIAGNOSIS — M109 Gout, unspecified: Secondary | ICD-10-CM | POA: Diagnosis not present

## 2015-05-01 DIAGNOSIS — B349 Viral infection, unspecified: Secondary | ICD-10-CM | POA: Diagnosis not present

## 2015-05-05 DIAGNOSIS — J069 Acute upper respiratory infection, unspecified: Secondary | ICD-10-CM | POA: Diagnosis not present

## 2015-05-05 DIAGNOSIS — R509 Fever, unspecified: Secondary | ICD-10-CM | POA: Diagnosis not present

## 2015-05-07 DIAGNOSIS — R509 Fever, unspecified: Secondary | ICD-10-CM | POA: Diagnosis not present

## 2015-05-11 ENCOUNTER — Telehealth: Payer: Self-pay | Admitting: Neurology

## 2015-05-11 NOTE — Telephone Encounter (Signed)
Pt called and is having more and more headaches and more symptoms like fever and chills for several weeks. States she is seeing her pcp for it. She is currently on antibiotics. She is wondering if she can increase her dosage of verapamil (CALAN-SR) 120 MG CR tablet for headaches. Please call and advise [phone]

## 2015-05-14 ENCOUNTER — Other Ambulatory Visit: Payer: Self-pay | Admitting: Neurology

## 2015-05-14 MED ORDER — CYCLOBENZAPRINE HCL 5 MG PO TABS
5.0000 mg | ORAL_TABLET | Freq: Three times a day (TID) | ORAL | Status: DC | PRN
Start: 2015-05-14 — End: 2016-03-02

## 2015-05-14 NOTE — Telephone Encounter (Signed)
She was on prednisone for several weeks for her rheumatoid arthritis. She finished the prednisone the end of September. She feels aching, chills, fever, headache. She has been sick ever since on Tramadol with fever and chills. Tylenol in between. She has been on doxycycline as well. She is having daily fevers of 101. For the last 2-3 days a little better like 100.7. No vision changes. Headache is around the ears. No nausea, no vomiting, no light sensitivity, no stiff neck, no jaw claudication. She having some muscle aches and pains in the whole body. Recommend flexeril 5mg  prn.

## 2015-06-25 DIAGNOSIS — M25442 Effusion, left hand: Secondary | ICD-10-CM | POA: Diagnosis not present

## 2015-06-25 DIAGNOSIS — M109 Gout, unspecified: Secondary | ICD-10-CM | POA: Diagnosis not present

## 2015-06-25 DIAGNOSIS — M25441 Effusion, right hand: Secondary | ICD-10-CM | POA: Diagnosis not present

## 2015-06-25 DIAGNOSIS — R799 Abnormal finding of blood chemistry, unspecified: Secondary | ICD-10-CM | POA: Diagnosis not present

## 2015-07-12 DIAGNOSIS — F330 Major depressive disorder, recurrent, mild: Secondary | ICD-10-CM | POA: Diagnosis not present

## 2015-07-18 ENCOUNTER — Inpatient Hospital Stay: Admit: 2015-07-18 | Primary: Family Medicine

## 2015-07-18 ENCOUNTER — Encounter

## 2015-07-18 DIAGNOSIS — R0602 Shortness of breath: Secondary | ICD-10-CM

## 2015-07-18 LAB — PROTIME-INR
INR: 1.5
Protime: 16.7 s — ABNORMAL HIGH (ref 9.3–12.4)

## 2015-07-18 LAB — RETICULOCYTES
Hematocrit: 27.2 % — ABNORMAL LOW (ref 34.0–48.0)
Retic Ct Abs: 0.109 E12/L
Retic Ct Pct: 3.9 % — ABNORMAL HIGH (ref 0.4–1.9)
Retic HGB Equivalent: 29.9 pg (ref 28.2–36.6)

## 2015-07-18 LAB — COMPREHENSIVE METABOLIC PANEL
ALT: 8 U/L (ref 0–32)
AST: 12 U/L (ref 0–31)
Albumin: 2.4 g/dL — ABNORMAL LOW (ref 3.5–5.2)
Alkaline Phosphatase: 46 U/L (ref 35–104)
Anion Gap: 9 mmol/L (ref 7–16)
BUN: 8 mg/dL (ref 8–23)
CO2: 28 mmol/L (ref 22–29)
Calcium: 7.5 mg/dL — ABNORMAL LOW (ref 8.6–10.2)
Chloride: 104 mmol/L (ref 98–107)
Creatinine: 0.5 mg/dL (ref 0.5–1.0)
GFR African American: >60
GFR Non-African American: >60 mL/min/{1.73_m2} (ref >=60)
Glucose: 112 mg/dL — ABNORMAL HIGH (ref 74–109)
Potassium: 4.5 mmol/L (ref 3.5–5.0)
Sodium: 141 mmol/L (ref 132–146)
Total Bilirubin: <0.2 mg/dL (ref 0.0–1.2)
Total Protein: 5.4 g/dL — ABNORMAL LOW (ref 6.4–8.3)

## 2015-07-18 LAB — CBC
Hematocrit: 26.5 % — ABNORMAL LOW (ref 34.0–48.0)
Hemoglobin: 7.9 g/dL — ABNORMAL LOW (ref 11.5–15.5)
MCH: 29.6 pg (ref 26.0–35.0)
MCHC: 29.8 % — ABNORMAL LOW (ref 32.0–34.5)
MCV: 99.3 fL (ref 80.0–99.9)
MPV: 10.1 fL (ref 7.0–12.0)
Platelets: 249 E9/L (ref 130–450)
RBC: 2.67 E12/L — ABNORMAL LOW (ref 3.50–5.50)
RDW: 15.1 fL — ABNORMAL HIGH (ref 11.5–15.0)
WBC: 5.7 E9/L (ref 4.5–11.5)

## 2015-07-18 LAB — MAGNESIUM: Magnesium: 2 mg/dL (ref 1.6–2.6)

## 2015-07-18 LAB — TOBRAMYCIN LEVEL, RANDOM: Tobramycin Rm: 1.9 ug/mL (ref 0.0–10.0)

## 2015-07-18 LAB — APTT: aPTT: 34.6 s (ref 24.5–35.1)

## 2015-07-18 LAB — C-REACTIVE PROTEIN: CRP: 2.3 mg/dL — ABNORMAL HIGH (ref 0.0–0.4)

## 2015-07-18 LAB — FERRITIN: Ferritin: 75 ng/mL

## 2015-07-18 LAB — PREALBUMIN: Prealbumin: 11 mg/dL — ABNORMAL LOW (ref 20–40)

## 2015-07-18 LAB — IRON AND TIBC
Iron Saturation: 19 % (ref 15–50)
Iron: 40 ug/mL (ref 37–145)
TIBC: 206 ug/dL — ABNORMAL LOW (ref 250–450)

## 2015-07-18 LAB — VITAMIN B12 & FOLATE
Folate: 13.4 ng/mL (ref 7.3–26.1)
Vitamin B-12: 1120 pg/mL — ABNORMAL HIGH (ref 211–946)

## 2015-07-18 LAB — TSH: TSH: 2.55 u[IU]/mL (ref 0.270–4.200)

## 2015-07-18 LAB — PHOSPHORUS: Phosphorus: 3.3 mg/dL (ref 2.5–4.5)

## 2015-07-19 LAB — PROTIME-INR
INR: 1.2
Protime: 13.1 s — ABNORMAL HIGH (ref 9.3–12.4)

## 2015-07-20 LAB — COMPREHENSIVE METABOLIC PANEL
ALT: 8 U/L (ref 0–32)
AST: 14 U/L (ref 0–31)
Albumin: 2.8 g/dL — ABNORMAL LOW (ref 3.5–5.2)
Alkaline Phosphatase: 48 U/L (ref 35–104)
Anion Gap: 10 mmol/L (ref 7–16)
BUN: 12 mg/dL (ref 8–23)
CO2: 30 mmol/L — ABNORMAL HIGH (ref 22–29)
Calcium: 7.7 mg/dL — ABNORMAL LOW (ref 8.6–10.2)
Chloride: 105 mmol/L (ref 98–107)
Creatinine: 0.4 mg/dL — ABNORMAL LOW (ref 0.5–1.0)
GFR African American: >60
GFR Non-African American: >60 mL/min/{1.73_m2} (ref >=60)
Glucose: 113 mg/dL — ABNORMAL HIGH (ref 74–109)
Potassium: 3.5 mmol/L (ref 3.5–5.0)
Sodium: 145 mmol/L (ref 132–146)
Total Bilirubin: <0.2 mg/dL (ref 0.0–1.2)
Total Protein: 5.8 g/dL — ABNORMAL LOW (ref 6.4–8.3)

## 2015-07-20 LAB — TSH: TSH: 0.768 u[IU]/mL (ref 0.270–4.200)

## 2015-07-20 LAB — CBC
Hematocrit: 27.9 % — ABNORMAL LOW (ref 34.0–48.0)
Hemoglobin: 8.3 g/dL — ABNORMAL LOW (ref 11.5–15.5)
MCH: 29.5 pg (ref 26.0–35.0)
MCHC: 29.7 % — ABNORMAL LOW (ref 32.0–34.5)
MCV: 99.3 fL (ref 80.0–99.9)
MPV: 10.1 fL (ref 7.0–12.0)
Platelets: 311 E9/L (ref 130–450)
RBC: 2.81 E12/L — ABNORMAL LOW (ref 3.50–5.50)
RDW: 14.9 fL (ref 11.5–15.0)
WBC: 8 E9/L (ref 4.5–11.5)

## 2015-07-20 LAB — HEMOGLOBIN A1C: Hemoglobin A1C: 5.6 % (ref 4.8–5.9)

## 2015-07-20 LAB — PROTIME-INR
INR: 1.2
Protime: 13.6 s — ABNORMAL HIGH (ref 9.3–12.4)

## 2015-07-21 LAB — CULTURE, RESPIRATORY

## 2015-07-21 LAB — CLOSTRIDIUM DIFFICILE EIA: C difficile Toxin, EIA: NOT DETECTED

## 2015-07-21 LAB — PROTIME-INR
INR: 1.3
Protime: 14.6 s — ABNORMAL HIGH (ref 9.3–12.4)

## 2015-07-22 LAB — PROTIME-INR
INR: 1.4
Protime: 15.4 s — ABNORMAL HIGH (ref 9.3–12.4)

## 2015-07-23 LAB — PROTIME-INR
INR: 1.4
Protime: 16.2 s — ABNORMAL HIGH (ref 9.3–12.4)

## 2015-07-24 LAB — COMPREHENSIVE METABOLIC PANEL
ALT: 10 U/L (ref 0–32)
AST: 16 U/L (ref 0–31)
Albumin: 2.9 g/dL — ABNORMAL LOW (ref 3.5–5.2)
Alkaline Phosphatase: 43 U/L (ref 35–104)
Anion Gap: 6 mmol/L — ABNORMAL LOW (ref 7–16)
BUN: 14 mg/dL (ref 8–23)
CO2: 37 mmol/L — ABNORMAL HIGH (ref 22–29)
Calcium: 7.8 mg/dL — ABNORMAL LOW (ref 8.6–10.2)
Chloride: 104 mmol/L (ref 98–107)
Creatinine: 0.5 mg/dL (ref 0.5–1.0)
GFR African American: >60
GFR Non-African American: >60 mL/min/{1.73_m2} (ref >=60)
Glucose: 111 mg/dL — ABNORMAL HIGH (ref 74–109)
Potassium: 3.7 mmol/L (ref 3.5–5.0)
Sodium: 147 mmol/L — ABNORMAL HIGH (ref 132–146)
Total Bilirubin: 0.2 mg/dL (ref 0.0–1.2)
Total Protein: 5.3 g/dL — ABNORMAL LOW (ref 6.4–8.3)

## 2015-07-24 LAB — CBC
Hematocrit: 28.5 % — ABNORMAL LOW (ref 34.0–48.0)
Hemoglobin: 8.3 g/dL — ABNORMAL LOW (ref 11.5–15.5)
MCH: 28.9 pg (ref 26.0–35.0)
MCHC: 29.1 % — ABNORMAL LOW (ref 32.0–34.5)
MCV: 99.3 fL (ref 80.0–99.9)
MPV: 9.9 fL (ref 7.0–12.0)
Platelets: 356 E9/L (ref 130–450)
RBC: 2.87 E12/L — ABNORMAL LOW (ref 3.50–5.50)
RDW: 15.4 fL — ABNORMAL HIGH (ref 11.5–15.0)
WBC: 7.5 E9/L (ref 4.5–11.5)

## 2015-07-24 LAB — PROTIME-INR
INR: 1.4
Protime: 16.3 s — ABNORMAL HIGH (ref 9.3–12.4)

## 2015-07-25 LAB — PROTIME-INR
INR: 1.7
Protime: 19.2 s — ABNORMAL HIGH (ref 9.3–12.4)

## 2015-07-26 LAB — PROTIME-INR
INR: 1.8
Protime: 20.3 s — ABNORMAL HIGH (ref 9.3–12.4)

## 2015-07-27 LAB — PROTIME-INR
INR: 2
Protime: 22.1 s — ABNORMAL HIGH (ref 9.3–12.4)

## 2015-07-28 LAB — PROTIME-INR
INR: 2.1
Protime: 23.3 s — ABNORMAL HIGH (ref 9.3–12.4)

## 2015-07-29 LAB — PROTIME-INR
INR: 2
Protime: 22.2 s — ABNORMAL HIGH (ref 9.3–12.4)

## 2015-07-30 LAB — COMPREHENSIVE METABOLIC PANEL
ALT: 14 U/L (ref 0–32)
AST: 17 U/L (ref 0–31)
Albumin: 2.9 g/dL — ABNORMAL LOW (ref 3.5–5.2)
Alkaline Phosphatase: 41 U/L (ref 35–104)
Anion Gap: 8 mmol/L (ref 7–16)
BUN: 16 mg/dL (ref 8–23)
CO2: 32 mmol/L — ABNORMAL HIGH (ref 22–29)
Calcium: 7.9 mg/dL — ABNORMAL LOW (ref 8.6–10.2)
Chloride: 101 mmol/L (ref 98–107)
Creatinine: 0.5 mg/dL (ref 0.5–1.0)
GFR African American: >60
GFR Non-African American: >60 mL/min/{1.73_m2} (ref >=60)
Glucose: 135 mg/dL — ABNORMAL HIGH (ref 74–109)
Potassium: 4.3 mmol/L (ref 3.5–5.0)
Sodium: 141 mmol/L (ref 132–146)
Total Bilirubin: <0.2 mg/dL (ref 0.0–1.2)
Total Protein: 5.7 g/dL — ABNORMAL LOW (ref 6.4–8.3)

## 2015-07-30 LAB — CBC
Hematocrit: 27.1 % — ABNORMAL LOW (ref 34.0–48.0)
Hemoglobin: 8.1 g/dL — ABNORMAL LOW (ref 11.5–15.5)
MCH: 29.5 pg (ref 26.0–35.0)
MCHC: 29.9 % — ABNORMAL LOW (ref 32.0–34.5)
MCV: 98.5 fL (ref 80.0–99.9)
MPV: 9.8 fL (ref 7.0–12.0)
Platelets: 206 E9/L (ref 130–450)
RBC: 2.75 E12/L — ABNORMAL LOW (ref 3.50–5.50)
RDW: 15.1 fL — ABNORMAL HIGH (ref 11.5–15.0)
WBC: 7.2 E9/L (ref 4.5–11.5)

## 2015-07-30 LAB — PROTIME-INR
INR: 1.8
Protime: 20.8 s — ABNORMAL HIGH (ref 9.3–12.4)

## 2015-07-30 LAB — BASIC METABOLIC PANEL
Anion Gap: 8 mmol/L (ref 7–16)
BUN: 16 mg/dL (ref 8–23)
CO2: 32 mmol/L — ABNORMAL HIGH (ref 22–29)
Calcium: 7.9 mg/dL — ABNORMAL LOW (ref 8.6–10.2)
Chloride: 101 mmol/L (ref 98–107)
Creatinine: 0.5 mg/dL (ref 0.5–1.0)
GFR African American: >60
GFR Non-African American: >60 mL/min/{1.73_m2} (ref >=60)
Glucose: 135 mg/dL — ABNORMAL HIGH (ref 74–109)
Potassium: 4.3 mmol/L (ref 3.5–5.0)
Sodium: 141 mmol/L (ref 132–146)

## 2015-07-31 LAB — PROTIME-INR
INR: 1.8
Protime: 20.8 s — ABNORMAL HIGH (ref 9.3–12.4)

## 2015-08-01 ENCOUNTER — Encounter

## 2015-08-01 ENCOUNTER — Ambulatory Visit: Admit: 2015-08-01 | Primary: Family Medicine

## 2015-08-01 ENCOUNTER — Inpatient Hospital Stay: Admit: 2015-08-01 | Primary: Family Medicine

## 2015-08-01 DIAGNOSIS — R0602 Shortness of breath: Secondary | ICD-10-CM

## 2015-08-01 LAB — CBC
Hematocrit: 27.6 % — ABNORMAL LOW (ref 34.0–48.0)
Hemoglobin: 8.3 g/dL — ABNORMAL LOW (ref 11.5–15.5)
MCH: 29 pg (ref 26.0–35.0)
MCHC: 30.1 % — ABNORMAL LOW (ref 32.0–34.5)
MCV: 96.5 fL (ref 80.0–99.9)
MPV: 9.7 fL (ref 7.0–12.0)
Platelets: 206 E9/L (ref 130–450)
RBC: 2.86 E12/L — ABNORMAL LOW (ref 3.50–5.50)
RDW: 15 fL (ref 11.5–15.0)
WBC: 8.9 E9/L (ref 4.5–11.5)

## 2015-08-01 LAB — COMPREHENSIVE METABOLIC PANEL
ALT: 15 U/L (ref 0–32)
AST: 16 U/L (ref 0–31)
Albumin: 3.2 g/dL — ABNORMAL LOW (ref 3.5–5.2)
Alkaline Phosphatase: 44 U/L (ref 35–104)
Anion Gap: 11 mmol/L (ref 7–16)
BUN: 13 mg/dL (ref 8–23)
CO2: 31 mmol/L — ABNORMAL HIGH (ref 22–29)
Calcium: 8.1 mg/dL — ABNORMAL LOW (ref 8.6–10.2)
Chloride: 101 mmol/L (ref 98–107)
Creatinine: 0.5 mg/dL (ref 0.5–1.0)
GFR African American: >60
GFR Non-African American: >60 mL/min/{1.73_m2} (ref >=60)
Glucose: 91 mg/dL (ref 74–109)
Potassium: 4.1 mmol/L (ref 3.5–5.0)
Sodium: 143 mmol/L (ref 132–146)
Total Bilirubin: 0.2 mg/dL (ref 0.0–1.2)
Total Protein: 5.7 g/dL — ABNORMAL LOW (ref 6.4–8.3)

## 2015-08-01 LAB — BRAIN NATRIURETIC PEPTIDE: Pro-BNP: 179 pg/mL (ref 0–450)

## 2015-08-01 LAB — TROPONIN: Troponin: <0.01 ng/mL (ref 0.00–0.03)

## 2015-08-01 LAB — PROTIME-INR
INR: 2.1
Protime: 23.4 s — ABNORMAL HIGH (ref 9.3–12.4)

## 2015-08-01 LAB — CK: Total CK: 17 U/L — ABNORMAL LOW (ref 20–180)

## 2015-08-01 LAB — MAGNESIUM: Magnesium: 2.2 mg/dL (ref 1.6–2.6)

## 2015-08-01 LAB — PHOSPHORUS: Phosphorus: 3.6 mg/dL (ref 2.5–4.5)

## 2015-08-01 LAB — CK-MB: CK-MB: <1 ng/mL (ref 0.0–4.3)

## 2015-08-01 LAB — D-DIMER, QUANTITATIVE: D-Dimer, Quant: 212 ng/mL DDU

## 2015-08-02 ENCOUNTER — Encounter

## 2015-08-02 ENCOUNTER — Inpatient Hospital Stay: Admit: 2015-08-02 | Primary: Family Medicine

## 2015-08-02 DIAGNOSIS — R0600 Dyspnea, unspecified: Secondary | ICD-10-CM

## 2015-08-02 LAB — COMPREHENSIVE METABOLIC PANEL
ALT: 17 U/L (ref 0–32)
AST: 18 U/L (ref 0–31)
Albumin: 2.9 g/dL — ABNORMAL LOW (ref 3.5–5.2)
Alkaline Phosphatase: 47 U/L (ref 35–104)
Anion Gap: 7 mmol/L (ref 7–16)
BUN: 14 mg/dL (ref 8–23)
CO2: 32 mmol/L — ABNORMAL HIGH (ref 22–29)
Calcium: 8.1 mg/dL — ABNORMAL LOW (ref 8.6–10.2)
Chloride: 103 mmol/L (ref 98–107)
Creatinine: 0.5 mg/dL (ref 0.5–1.0)
GFR African American: >60
GFR Non-African American: >60 mL/min/{1.73_m2} (ref >=60)
Glucose: 108 mg/dL (ref 74–109)
Potassium: 4.5 mmol/L (ref 3.5–5.0)
Sodium: 142 mmol/L (ref 132–146)
Total Bilirubin: 0.2 mg/dL (ref 0.0–1.2)
Total Protein: 5.8 g/dL — ABNORMAL LOW (ref 6.4–8.3)

## 2015-08-02 LAB — CBC WITH AUTO DIFFERENTIAL
Basophils %: 0.4 % (ref 0.0–2.0)
Basophils Absolute: 0.03 E9/L (ref 0.00–0.20)
Eosinophils %: 1.6 % (ref 0.0–6.0)
Eosinophils Absolute: 0.13 E9/L (ref 0.05–0.50)
Hematocrit: 27.6 % — ABNORMAL LOW (ref 34.0–48.0)
Hemoglobin: 8.1 g/dL — ABNORMAL LOW (ref 11.5–15.5)
Immature Granulocytes #: 0.23 E9/L
Immature Granulocytes %: 2.8 % (ref 0.0–5.0)
Lymphocytes %: 27.8 % (ref 20.0–42.0)
Lymphocytes Absolute: 2.27 E9/L (ref 1.50–4.00)
MCH: 28.4 pg (ref 26.0–35.0)
MCHC: 29.3 % — ABNORMAL LOW (ref 32.0–34.5)
MCV: 96.8 fL (ref 80.0–99.9)
MPV: 10 fL (ref 7.0–12.0)
Monocytes %: 7.6 % (ref 2.0–12.0)
Monocytes Absolute: 0.62 E9/L (ref 0.10–0.95)
Neutrophils %: 59.8 % (ref 43.0–80.0)
Neutrophils Absolute: 4.88 E9/L (ref 1.80–7.30)
Platelets: 202 E9/L (ref 130–450)
RBC: 2.85 E12/L — ABNORMAL LOW (ref 3.50–5.50)
RDW: 15.1 fL — ABNORMAL HIGH (ref 11.5–15.0)
WBC: 8.2 E9/L (ref 4.5–11.5)

## 2015-08-02 LAB — PROTIME-INR
INR: 2
Protime: 23.1 s — ABNORMAL HIGH (ref 9.3–12.4)

## 2015-08-03 LAB — PROTIME-INR
INR: 2.1
Protime: 24.2 s — ABNORMAL HIGH (ref 9.3–12.4)

## 2015-08-04 ENCOUNTER — Encounter

## 2015-08-04 ENCOUNTER — Inpatient Hospital Stay: Admit: 2015-08-04 | Primary: Family Medicine

## 2015-08-04 DIAGNOSIS — R0600 Dyspnea, unspecified: Secondary | ICD-10-CM

## 2015-08-04 LAB — PROTIME-INR
INR: 2.2
Protime: 24.5 s — ABNORMAL HIGH (ref 9.3–12.4)

## 2015-08-05 ENCOUNTER — Inpatient Hospital Stay: Admit: 2015-08-05 | Primary: Family Medicine

## 2015-08-05 ENCOUNTER — Encounter

## 2015-08-05 DIAGNOSIS — R52 Pain, unspecified: Secondary | ICD-10-CM

## 2015-08-05 LAB — PROTIME-INR
INR: 2
Protime: 22.8 s — ABNORMAL HIGH (ref 9.3–12.4)

## 2015-08-06 LAB — CBC WITH AUTO DIFFERENTIAL
Basophils %: 0.2 % (ref 0.0–2.0)
Basophils Absolute: 0.02 E9/L (ref 0.00–0.20)
Eosinophils %: 2.2 % (ref 0.0–6.0)
Eosinophils Absolute: 0.18 E9/L (ref 0.05–0.50)
Hematocrit: 27.1 % — ABNORMAL LOW (ref 34.0–48.0)
Hemoglobin: 8 g/dL — ABNORMAL LOW (ref 11.5–15.5)
Immature Granulocytes #: 0.15 E9/L
Immature Granulocytes %: 1.8 % (ref 0.0–5.0)
Lymphocytes %: 23.9 % (ref 20.0–42.0)
Lymphocytes Absolute: 1.96 E9/L (ref 1.50–4.00)
MCH: 28.7 pg (ref 26.0–35.0)
MCHC: 29.5 % — ABNORMAL LOW (ref 32.0–34.5)
MCV: 97.1 fL (ref 80.0–99.9)
MPV: 9.7 fL (ref 7.0–12.0)
Monocytes %: 7 % (ref 2.0–12.0)
Monocytes Absolute: 0.57 E9/L (ref 0.10–0.95)
Neutrophils %: 64.9 % (ref 43.0–80.0)
Neutrophils Absolute: 5.32 E9/L (ref 1.80–7.30)
Platelets: 195 E9/L (ref 130–450)
RBC: 2.79 E12/L — ABNORMAL LOW (ref 3.50–5.50)
RDW: 14.8 fL (ref 11.5–15.0)
WBC: 8.2 E9/L (ref 4.5–11.5)

## 2015-08-06 LAB — COMPREHENSIVE METABOLIC PANEL
ALT: 14 U/L (ref 0–32)
AST: 17 U/L (ref 0–31)
Albumin: 3.2 g/dL — ABNORMAL LOW (ref 3.5–5.2)
Alkaline Phosphatase: 48 U/L (ref 35–104)
Anion Gap: 8 mmol/L (ref 7–16)
BUN: 16 mg/dL (ref 8–23)
CO2: 31 mmol/L — ABNORMAL HIGH (ref 22–29)
Calcium: 8.4 mg/dL — ABNORMAL LOW (ref 8.6–10.2)
Chloride: 100 mmol/L (ref 98–107)
Creatinine: 0.5 mg/dL (ref 0.5–1.0)
GFR African American: >60
GFR Non-African American: >60 mL/min/{1.73_m2} (ref >=60)
Glucose: 133 mg/dL — ABNORMAL HIGH (ref 74–109)
Potassium: 4.3 mmol/L (ref 3.5–5.0)
Sodium: 139 mmol/L (ref 132–146)
Total Bilirubin: 0.2 mg/dL (ref 0.0–1.2)
Total Protein: 6 g/dL — ABNORMAL LOW (ref 6.4–8.3)

## 2015-08-06 LAB — PROTIME-INR
INR: 2
Protime: 22.9 s — ABNORMAL HIGH (ref 9.3–12.4)

## 2015-08-07 LAB — CBC
Hematocrit: 25.6 % — ABNORMAL LOW (ref 34.0–48.0)
Hemoglobin: 7.6 g/dL — ABNORMAL LOW (ref 11.5–15.5)
MCH: 28.7 pg (ref 26.0–35.0)
MCHC: 29.7 % — ABNORMAL LOW (ref 32.0–34.5)
MCV: 96.6 fL (ref 80.0–99.9)
MPV: 10 fL (ref 7.0–12.0)
Platelets: 185 E9/L (ref 130–450)
RBC: 2.65 E12/L — ABNORMAL LOW (ref 3.50–5.50)
RDW: 14.8 fL (ref 11.5–15.0)
WBC: 7.3 E9/L (ref 4.5–11.5)

## 2015-08-07 LAB — COMPREHENSIVE METABOLIC PANEL
ALT: 14 U/L (ref 0–32)
AST: 14 U/L (ref 0–31)
Albumin: 3 g/dL — ABNORMAL LOW (ref 3.5–5.2)
Alkaline Phosphatase: 46 U/L (ref 35–104)
Anion Gap: 10 mmol/L (ref 7–16)
BUN: 26 mg/dL — ABNORMAL HIGH (ref 8–23)
CO2: 29 mmol/L (ref 22–29)
Calcium: 8.1 mg/dL — ABNORMAL LOW (ref 8.6–10.2)
Chloride: 102 mmol/L (ref 98–107)
Creatinine: 0.6 mg/dL (ref 0.5–1.0)
GFR African American: >60
GFR Non-African American: >60 mL/min/{1.73_m2} (ref >=60)
Glucose: 115 mg/dL — ABNORMAL HIGH (ref 74–109)
Potassium: 4.7 mmol/L (ref 3.5–5.0)
Sodium: 141 mmol/L (ref 132–146)
Total Bilirubin: 0.2 mg/dL (ref 0.0–1.2)
Total Protein: 5.7 g/dL — ABNORMAL LOW (ref 6.4–8.3)

## 2015-08-07 LAB — MAGNESIUM: Magnesium: 2.1 mg/dL (ref 1.6–2.6)

## 2015-08-07 LAB — PROTIME-INR
INR: 2.3
Protime: 26.2 s — ABNORMAL HIGH (ref 9.3–12.4)

## 2015-08-07 LAB — PHOSPHORUS: Phosphorus: 4.1 mg/dL (ref 2.5–4.5)

## 2015-08-08 ENCOUNTER — Encounter

## 2015-08-08 ENCOUNTER — Inpatient Hospital Stay: Admit: 2015-08-08 | Primary: Family Medicine

## 2015-08-08 DIAGNOSIS — J398 Other specified diseases of upper respiratory tract: Secondary | ICD-10-CM

## 2015-08-08 LAB — PROTIME-INR
INR: 2.9
Protime: 32.7 s — ABNORMAL HIGH (ref 9.3–12.4)

## 2015-08-09 LAB — BLOOD OCCULT STOOL SCREEN #1: Occult Blood Screening: POSITIVE

## 2015-08-09 LAB — COMPREHENSIVE METABOLIC PANEL
ALT: 13 U/L (ref 0–32)
AST: 15 U/L (ref 0–31)
Albumin: 3.1 g/dL — ABNORMAL LOW (ref 3.5–5.2)
Alkaline Phosphatase: 45 U/L (ref 35–104)
Anion Gap: 10 mmol/L (ref 7–16)
BUN: 23 mg/dL (ref 8–23)
CO2: 29 mmol/L (ref 22–29)
Calcium: 8.2 mg/dL — ABNORMAL LOW (ref 8.6–10.2)
Chloride: 102 mmol/L (ref 98–107)
Creatinine: 0.6 mg/dL (ref 0.5–1.0)
GFR African American: >60
GFR Non-African American: >60 mL/min/{1.73_m2} (ref >=60)
Glucose: 123 mg/dL — ABNORMAL HIGH (ref 74–109)
Potassium: 5.2 mmol/L — ABNORMAL HIGH (ref 3.5–5.0)
Sodium: 141 mmol/L (ref 132–146)
Total Bilirubin: <0.2 mg/dL (ref 0.0–1.2)
Total Protein: 5.8 g/dL — ABNORMAL LOW (ref 6.4–8.3)

## 2015-08-09 LAB — PHOSPHORUS: Phosphorus: 4.3 mg/dL (ref 2.5–4.5)

## 2015-08-09 LAB — CBC WITH AUTO DIFFERENTIAL
Basophils %: 0.3 % (ref 0.0–2.0)
Basophils Absolute: 0.03 E9/L (ref 0.00–0.20)
Eosinophils %: 1.8 % (ref 0.0–6.0)
Eosinophils Absolute: 0.17 E9/L (ref 0.05–0.50)
Hematocrit: 26.1 % — ABNORMAL LOW (ref 34.0–48.0)
Hemoglobin: 7.7 g/dL — ABNORMAL LOW (ref 11.5–15.5)
Immature Granulocytes #: 0.09 E9/L
Immature Granulocytes %: 1 % (ref 0.0–5.0)
Lymphocytes %: 21.6 % (ref 20.0–42.0)
Lymphocytes Absolute: 2.01 E9/L (ref 1.50–4.00)
MCH: 28.5 pg (ref 26.0–35.0)
MCHC: 29.5 % — ABNORMAL LOW (ref 32.0–34.5)
MCV: 96.7 fL (ref 80.0–99.9)
MPV: 9.9 fL (ref 7.0–12.0)
Monocytes %: 4.5 % (ref 2.0–12.0)
Monocytes Absolute: 0.42 E9/L (ref 0.10–0.95)
Neutrophils %: 70.8 % (ref 43.0–80.0)
Neutrophils Absolute: 6.58 E9/L (ref 1.80–7.30)
Platelets: 202 E9/L (ref 130–450)
RBC: 2.7 E12/L — ABNORMAL LOW (ref 3.50–5.50)
RDW: 14.8 fL (ref 11.5–15.0)
WBC: 9.3 E9/L (ref 4.5–11.5)

## 2015-08-09 LAB — MAGNESIUM: Magnesium: 2.5 mg/dL (ref 1.6–2.6)

## 2015-08-09 LAB — PROTIME-INR
INR: 2.9
Protime: 32.2 s — ABNORMAL HIGH (ref 9.3–12.4)

## 2015-08-10 DIAGNOSIS — E7800 Pure hypercholesterolemia, unspecified: Secondary | ICD-10-CM | POA: Diagnosis not present

## 2015-08-10 DIAGNOSIS — I10 Essential (primary) hypertension: Secondary | ICD-10-CM | POA: Diagnosis not present

## 2015-08-10 LAB — PROTIME-INR
INR: 2.1
Protime: 24 s — ABNORMAL HIGH (ref 9.3–12.4)

## 2015-08-10 LAB — HEMOGLOBIN AND HEMATOCRIT
Hematocrit: 28.4 % — ABNORMAL LOW (ref 34.0–48.0)
Hemoglobin: 8.3 g/dL — ABNORMAL LOW (ref 11.5–15.5)

## 2015-08-11 LAB — HEMOGLOBIN AND HEMATOCRIT
Hematocrit: 25.4 % — ABNORMAL LOW (ref 34.0–48.0)
Hemoglobin: 7.5 g/dL — ABNORMAL LOW (ref 11.5–15.5)

## 2015-08-11 LAB — PROTIME-INR
INR: 1.7
Protime: 19.6 s — ABNORMAL HIGH (ref 9.3–12.4)

## 2015-08-12 LAB — HEMOGLOBIN AND HEMATOCRIT
Hematocrit: 26.2 % — ABNORMAL LOW (ref 34.0–48.0)
Hematocrit: 25.2 % — ABNORMAL LOW (ref 34.0–48.0)
Hemoglobin: 7.8 g/dL — ABNORMAL LOW (ref 11.5–15.5)
Hemoglobin: 7.5 g/dL — ABNORMAL LOW (ref 11.5–15.5)

## 2015-08-12 LAB — PROTIME-INR
INR: 1.4
Protime: 15.3 s — ABNORMAL HIGH (ref 9.3–12.4)

## 2015-08-13 ENCOUNTER — Inpatient Hospital Stay: Attending: Internal Medicine | Primary: Family Medicine

## 2015-08-13 LAB — CBC WITH AUTO DIFFERENTIAL
Basophils %: 0.1 % (ref 0.0–2.0)
Basophils Absolute: 0.01 E9/L (ref 0.00–0.20)
Eosinophils %: 1.9 % (ref 0.0–6.0)
Eosinophils Absolute: 0.16 E9/L (ref 0.05–0.50)
Hematocrit: 24.7 % — ABNORMAL LOW (ref 34.0–48.0)
Hemoglobin: 7.2 g/dL — ABNORMAL LOW (ref 11.5–15.5)
Immature Granulocytes #: 0.06 E9/L
Immature Granulocytes %: 0.7 % (ref 0.0–5.0)
Lymphocytes %: 25.9 % (ref 20.0–42.0)
Lymphocytes Absolute: 2.24 E9/L (ref 1.50–4.00)
MCH: 27.7 pg (ref 26.0–35.0)
MCHC: 29.1 % — ABNORMAL LOW (ref 32.0–34.5)
MCV: 95 fL (ref 80.0–99.9)
MPV: 9.1 fL (ref 7.0–12.0)
Monocytes %: 4.5 % (ref 2.0–12.0)
Monocytes Absolute: 0.39 E9/L (ref 0.10–0.95)
Neutrophils %: 66.9 % (ref 43.0–80.0)
Neutrophils Absolute: 5.78 E9/L (ref 1.80–7.30)
Platelets: 212 E9/L (ref 130–450)
RBC: 2.6 E12/L — ABNORMAL LOW (ref 3.50–5.50)
RDW: 14.4 fL (ref 11.5–15.0)
WBC: 8.6 E9/L (ref 4.5–11.5)

## 2015-08-13 LAB — COMPREHENSIVE METABOLIC PANEL
ALT: 11 U/L (ref 0–32)
AST: 13 U/L (ref 0–31)
Albumin: 3.1 g/dL — ABNORMAL LOW (ref 3.5–5.2)
Alkaline Phosphatase: 44 U/L (ref 35–104)
Anion Gap: 8 mmol/L (ref 7–16)
BUN: 18 mg/dL (ref 8–23)
CO2: 28 mmol/L (ref 22–29)
Calcium: 7.8 mg/dL — ABNORMAL LOW (ref 8.6–10.2)
Chloride: 104 mmol/L (ref 98–107)
Creatinine: 0.5 mg/dL (ref 0.5–1.0)
GFR African American: >60
GFR Non-African American: >60 mL/min/{1.73_m2} (ref >=60)
Glucose: 125 mg/dL — ABNORMAL HIGH (ref 74–109)
Potassium: 4.3 mmol/L (ref 3.5–5.0)
Sodium: 140 mmol/L (ref 132–146)
Total Bilirubin: 0.2 mg/dL (ref 0.0–1.2)
Total Protein: 5.8 g/dL — ABNORMAL LOW (ref 6.4–8.3)

## 2015-08-13 LAB — VITAMIN B12: Vitamin B-12: 821 pg/mL (ref 211–946)

## 2015-08-13 LAB — TROPONIN: Troponin: <0.01 ng/mL (ref 0.00–0.03)

## 2015-08-13 LAB — PROTIME-INR
INR: 1.1
Protime: 12.7 s — ABNORMAL HIGH (ref 9.3–12.4)

## 2015-08-13 LAB — TYPE AND SCREEN
ABO/Rh: A NEG
Antibody Screen: NEGATIVE

## 2015-08-13 LAB — FOLATE: Folate: 18 ng/mL (ref 7.3–26.1)

## 2015-08-13 LAB — IRON AND TIBC
Iron Saturation: 6 % — ABNORMAL LOW (ref 15–50)
Iron: 18 ug/mL — ABNORMAL LOW (ref 37–145)
TIBC: 323 ug/dL (ref 250–450)

## 2015-08-13 LAB — FERRITIN: Ferritin: 19 ng/mL

## 2015-08-13 LAB — HEMOGLOBIN AND HEMATOCRIT
Hematocrit: 24.8 % — ABNORMAL LOW (ref 34.0–48.0)
Hematocrit: 28.2 % — ABNORMAL LOW (ref 34.0–48.0)
Hemoglobin: 7.5 g/dL — ABNORMAL LOW (ref 11.5–15.5)
Hemoglobin: 8.1 g/dL — ABNORMAL LOW (ref 11.5–15.5)

## 2015-08-14 ENCOUNTER — Encounter

## 2015-08-14 ENCOUNTER — Ambulatory Visit: Admit: 2015-08-14 | Primary: Family Medicine

## 2015-08-14 ENCOUNTER — Inpatient Hospital Stay: Attending: Gastroenterology | Primary: Family Medicine

## 2015-08-14 DIAGNOSIS — Z452 Encounter for adjustment and management of vascular access device: Secondary | ICD-10-CM

## 2015-08-14 LAB — COMPREHENSIVE METABOLIC PANEL
ALT: 11 U/L (ref 0–32)
AST: 14 U/L (ref 0–31)
Albumin: 2.9 g/dL — ABNORMAL LOW (ref 3.5–5.2)
Alkaline Phosphatase: 45 U/L (ref 35–104)
Anion Gap: 10 mmol/L (ref 7–16)
BUN: 18 mg/dL (ref 8–23)
CO2: 27 mmol/L (ref 22–29)
Calcium: 7.9 mg/dL — ABNORMAL LOW (ref 8.6–10.2)
Chloride: 104 mmol/L (ref 98–107)
Creatinine: 0.6 mg/dL (ref 0.5–1.0)
GFR African American: >60
GFR Non-African American: >60 mL/min/{1.73_m2} (ref >=60)
Glucose: 111 mg/dL — ABNORMAL HIGH (ref 74–109)
Potassium: 4.2 mmol/L (ref 3.5–5.0)
Sodium: 141 mmol/L (ref 132–146)
Total Bilirubin: 0.2 mg/dL (ref 0.0–1.2)
Total Protein: 6 g/dL — ABNORMAL LOW (ref 6.4–8.3)

## 2015-08-14 LAB — CBC
Hematocrit: 24.7 % — ABNORMAL LOW (ref 34.0–48.0)
Hemoglobin: 7.6 g/dL — ABNORMAL LOW (ref 11.5–15.5)
MCH: 28.8 pg (ref 26.0–35.0)
MCHC: 30.8 % — ABNORMAL LOW (ref 32.0–34.5)
MCV: 93.6 fL (ref 80.0–99.9)
MPV: 10.1 fL (ref 7.0–12.0)
Platelets: 249 E9/L (ref 130–450)
RBC: 2.64 E12/L — ABNORMAL LOW (ref 3.50–5.50)
RDW: 14.7 fL (ref 11.5–15.0)
WBC: 9.8 E9/L (ref 4.5–11.5)

## 2015-08-14 LAB — MAGNESIUM: Magnesium: 2.2 mg/dL (ref 1.6–2.6)

## 2015-08-14 LAB — PROTIME-INR
INR: 1
Protime: 11.7 s (ref 9.3–12.4)

## 2015-08-14 LAB — PHOSPHORUS: Phosphorus: 4 mg/dL (ref 2.5–4.5)

## 2015-08-14 NOTE — Anesthesia Pre-Procedure Evaluation (Signed)
Department of Anesthesiology  Preprocedure Note       Name:  Bridget Aguilar   Age:  80 y.o.  DOB:  07-04-1936                                          MRN:  [id number]         Date:  08/14/2015      Surgeon:Dodig     Procedure: Upper endoscopy     Medications prior to admission:   Prior to Admission medications    Medication Sig Start Date End Date Taking? Authorizing Provider   ipratropium-albuterol (DUONEB) 0.5-2.5 (3) MG/3ML SOLN nebulizer solution Inhale 1 vial into the lungs every 4 hours    Historical Provider, MD   pantoprazole (PROTONIX) 40 MG tablet Take 40 mg by mouth 2 times daily    Historical Provider, MD   chlorhexidine (PERIDEX) 0.12 % solution Take 15 mLs by mouth 2 times daily    Historical Provider, MD   hyoscyamine (ANASPAZ;LEVSIN) 125 MCG tablet Take 125 mcg by mouth 3 times daily    Historical Provider, MD   acetylcysteine (MUCOMYST) 20 % nebulizer solution Inhale 70 mg/kg into the lungs every 12 hours    Historical Provider, MD   guaiFENesin 400 MG tablet Take 400 mg by mouth 3 times daily    Historical Provider, MD   Cholecalciferol (VITAMIN D3) 3000 UNITS TABS Take 1,000 Units by mouth daily    Historical Provider, MD   Glucose Blood (BLOOD GLUCOSE TEST STRIPS) STRP by In Vitro route    Historical Provider, MD   sucralfate (CARAFATE) 1 GM tablet Take 1 g by mouth 4 times daily    Historical Provider, MD   Multiple Vitamins-Minerals (THERAPEUTIC MULTIVITAMIN-MINERALS) tablet Take 1 tablet by mouth daily    Historical Provider, MD   gabapentin (NEURONTIN) 100 MG capsule Take 100 mg by mouth nightly    Historical Provider, MD   HYDROcodone-acetaminophen (NORCO) 5-325 MG per tablet Take 1 tablet by mouth 3 times daily as needed for Pain .    Historical Provider, MD   nystatin (MYCOSTATIN) 100000 UNIT/ML suspension Take 500,000 Units by mouth 3 times daily. No concentration listed for med, give 5 cc swish and spit 3x daily    Historical Provider, MD   warfarin (COUMADIN) 5 MG tablet Take 5 mg by mouth  daily Daily order    Historical Provider, MD   hydrocortisone (CORTEF) 10 MG tablet Take 15 mg by mouth 2 times daily     Historical Provider, MD   insulin glargine (LANTUS) 100 UNIT/ML injection vial Inject 5 Units into the skin nightly     Historical Provider, MD   Magnesium Oxide (MAG-200) 200 MG TABS 400 mg by Gastrostomy Tube route 2 times daily     Historical Provider, MD   metoprolol (TOPROL-XL) 25 MG XL tablet Take 12.5 mg by mouth 2 times daily     Historical Provider, MD   levothyroxine (SYNTHROID) 25 MCG tablet 25 mcg by Gastrostomy Tube route Daily.    Historical Provider, MD   LORazepam (ATIVAN) 2 MG/ML concentrated solution 0.5 mg by Gastrostomy Tube route 2 times daily     Historical Provider, MD   acetaminophen (TYLENOL) 325 MG tablet Take 650 mg by mouth every 6 hours as needed for Pain     Historical Provider, MD   NONFORMULARY Check placement  enteral tube every shift, as needed and PRN  Hold tube feed for 1 hour before and after meals.    Historical Provider, MD   NONFORMULARY [place],pureed,thin complete upright for all oral intake, alternate bites/sips 3:1, no stdraaws    Historical Provider, MD       Current medications:    Current Outpatient Prescriptions   Medication Sig Dispense Refill   ??? ipratropium-albuterol (DUONEB) 0.5-2.5 (3) MG/3ML SOLN nebulizer solution Inhale 1 vial into the lungs every 4 hours     ??? pantoprazole (PROTONIX) 40 MG tablet Take 40 mg by mouth 2 times daily     ??? chlorhexidine (PERIDEX) 0.12 % solution Take 15 mLs by mouth 2 times daily     ??? hyoscyamine (ANASPAZ;LEVSIN) 125 MCG tablet Take 125 mcg by mouth 3 times daily     ??? acetylcysteine (MUCOMYST) 20 % nebulizer solution Inhale 70 mg/kg into the lungs every 12 hours     ??? guaiFENesin 400 MG tablet Take 400 mg by mouth 3 times daily     ??? Cholecalciferol (VITAMIN D3) 3000 UNITS TABS Take 1,000 Units by mouth daily     ??? Glucose Blood (BLOOD GLUCOSE TEST STRIPS) STRP by In Vitro route     ??? sucralfate (CARAFATE) 1 GM  tablet Take 1 g by mouth 4 times daily     ??? Multiple Vitamins-Minerals (THERAPEUTIC MULTIVITAMIN-MINERALS) tablet Take 1 tablet by mouth daily     ??? gabapentin (NEURONTIN) 100 MG capsule Take 100 mg by mouth nightly     ??? HYDROcodone-acetaminophen (NORCO) 5-325 MG per tablet Take 1 tablet by mouth 3 times daily as needed for Pain .     ??? nystatin (MYCOSTATIN) 100000 UNIT/ML suspension Take 500,000 Units by mouth 3 times daily. No concentration listed for med, give 5 cc swish and spit 3x daily     ??? warfarin (COUMADIN) 5 MG tablet Take 5 mg by mouth daily Daily order     ??? hydrocortisone (CORTEF) 10 MG tablet Take 15 mg by mouth 2 times daily      ??? insulin glargine (LANTUS) 100 UNIT/ML injection vial Inject 5 Units into the skin nightly      ??? Magnesium Oxide (MAG-200) 200 MG TABS 400 mg by Gastrostomy Tube route 2 times daily      ??? metoprolol (TOPROL-XL) 25 MG XL tablet Take 12.5 mg by mouth 2 times daily      ??? levothyroxine (SYNTHROID) 25 MCG tablet 25 mcg by Gastrostomy Tube route Daily.     ??? LORazepam (ATIVAN) 2 MG/ML concentrated solution 0.5 mg by Gastrostomy Tube route 2 times daily      ??? acetaminophen (TYLENOL) 325 MG tablet Take 650 mg by mouth every 6 hours as needed for Pain      ??? NONFORMULARY Check placement enteral tube every shift, as needed and PRN  Hold tube feed for 1 hour before and after meals.     ??? NONFORMULARY [place],pureed,thin complete upright for all oral intake, alternate bites/sips 3:1, no stdraaws       No current facility-administered medications for this encounter.        Allergies:    Allergies   Allergen Reactions   ??? Diazepam      unknown   ??? Dye [Iodides]      unknown   ??? Penicillins      unknown   ??? Precedex [Dexmedetomidine Hcl In Nacl]      unknown  Problem List:    Patient Active Problem List   Diagnosis Code   ??? Respiratory failure, acute and chronic (HCC) J96.20   ??? Obesity hypoventilation syndrome (HCC) E66.2   ??? Addison disease (HCC) E27.1   ??? Pseudomonas  respiratory infection J98.8, A49.8   ??? Tracheostomy care (HCC) Z43.0   ??? History of DVT of lower extremity Z86.718       Past Medical History:        Diagnosis Date   ??? Acute and chronic respiratory failure    ??? Anemia of other chronic disease    ??? Anxiety    ??? Cardiac arrest - ventricular fibrillation    ??? Chronic anemia    ??? Depression    ??? Depressive disorder, not elsewhere classified    ??? Gastrostomy status (HCC)    ??? Glucocorticoid deficiency (HCC)    ??? Hx of blood clots    ??? Metabolic encephalopathy    ??? Osteoporosis    ??? Osteoporosis, unspecified    ??? Other disorders of the pituitary and other syndromes of diencephalohypophyseal origin    ??? Pneumonia    ??? Pneumonia, organism unspecified    ??? Primary localized osteoarthrosis, lower leg    ??? Pulmonary embolism (HCC)    ??? Respiratory failure (HCC)    ??? Sepsis(995.91)    ??? Tracheostomy status (HCC)    ??? Unspecified essential hypertension    ??? Unspecified hypothyroidism    ??? Unspecified septicemia (HCC)    ??? Venous insufficiency        Past Surgical History:        Procedure Laterality Date   ??? Hysterectomy     ??? Tracheostomy     ??? Bronchoscopy     ??? Kyphosis surgery     ??? Cholecystectomy     ??? Gastrostomy tube placement     ??? Eye surgery     ??? Rotator cuff repair     ??? Thyroidectomy     ??? Tonsillectomy     ??? Adenoidectomy     ??? Other surgical history       ivc filter placement       Social History:    Social History   Substance Use Topics   ??? Smoking status: Unknown If Ever Smoked   ??? Smokeless tobacco: Not on file   ??? Alcohol use Not on file                                Counseling given: Not Answered      Vital Signs (Current): There were no vitals filed for this visit.                                           BP Readings from Last 3 Encounters:   09/24/13 122/74       NPO Status:  BMI:   Wt Readings from Last 3 Encounters:   08/13/15 159 lb (72.1 kg)   09/24/13 170 lb (77.1 kg)      There is no height or weight on file to calculate BMI.    Anesthesia Evaluation  Patient summary reviewed  Airway: Mallampati: III  TM distance: >3 FB   Neck ROM: full  Mouth opening: > = 3 FB Dental:    (+) edentulous      Pulmonary:normal exam    (+) pneumonia:         Cardiovascular:    (+) hypertension:,             Beta Blocker:  Dose within 24 Hrs   Neuro/Psych:   (+) psychiatric history:   GI/Hepatic/Renal: neg ROS          Endo/Other:    (+) Type II DM, using insulin, hypothyroidism::.      Abdominal:             Other findings: Trach in place using trach mask.       Anesthesia Plan    ASA 3     MAC     intravenous induction   Anesthetic plan and risks discussed with patient.    Plan discussed with CRNA.            Chistine Dematteo Vergie Living, MD   08/14/2015

## 2015-08-14 NOTE — Anesthesia Post-Procedure Evaluation (Signed)
Department of Anesthesiology  Post-Anesthesia Note    Name:  Bridget Aguilar                                         Age:  80 y.o.  MRN:  [id number]     Last Vitals:  There were no vitals taken for this visit.  No data found.      Level of Consciousness:  Awake    Respiratory:  Stable    Oxygen Saturation:  Stable    Cardiovascular:  Stable    Hydration:  Adequate    PONV:  Stable    Post-op Pain:  Adequate analgesia    Post-op Assessment:  No apparent anesthetic complications    Additional Follow-Up / Treatment / Comment:  None    Reata Petrov Vergie LivingWILLIAM Neville Pauls, MD  August 14, 2015   5:15 PM  Anesthesia Post Evaluation    Hydee Fleece Vergie LivingWILLIAM Fabiano Ginley, MD  5:15 PM

## 2015-08-14 NOTE — Procedures (Signed)
Bridget KhanMary P Laton is a 80 y.o. female patient.  1. PICC (peripherally inserted central catheter) flush      Past Medical History   Diagnosis Date   ??? Acute and chronic respiratory failure    ??? Anemia of other chronic disease    ??? Anxiety    ??? Cardiac arrest - ventricular fibrillation    ??? Chronic anemia    ??? Depression    ??? Depressive disorder, not elsewhere classified    ??? Gastrostomy status (HCC)    ??? Glucocorticoid deficiency (HCC)    ??? Hx of blood clots    ??? Metabolic encephalopathy    ??? Osteoporosis    ??? Osteoporosis, unspecified    ??? Other disorders of the pituitary and other syndromes of diencephalohypophyseal origin    ??? Pneumonia    ??? Pneumonia, organism unspecified    ??? Primary localized osteoarthrosis, lower leg    ??? Pulmonary embolism (HCC)    ??? Respiratory failure (HCC)    ??? Sepsis(995.91)    ??? Tracheostomy status (HCC)    ??? Unspecified essential hypertension    ??? Unspecified hypothyroidism    ??? Unspecified septicemia (HCC)    ??? Venous insufficiency      There were no vitals taken for this visit.    Procedures  Procedure:  Esophagogastroduodenoscopy    Indication:  Acute on Chronic Anemia    Sedation:  MAC      Endoscope was advanced easily through mouth to second portion of duodenum      Oropharynx views are limited but grossly normal.    Esophagus:   Mucosa is normal.  GEJ at 37 cm.      Stomach:   Antrum shows mild gastritis. Biopsies to r/o H. Pylori.    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 No signs of inflammation, ulcerations, fresh or old blood.    Retroflexed views show normal fundus and cardia.    Duodenum: Bulb is normal.    Second portion of duodenum is normal. Biopsies taken to r/o H. Pylori.     IMPRESSION AND PLAN:     1. Mild gastritis. 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 Biopsies as above.    2. Continue PPI daily. Ok to resume diet. Consider colonoscopy as there is no explanation for the patient's anemia found on upper endoscopy.    Follow up  as outpatient in office, call [phone] to schedule for appointment.    Pt was seen and procedure was performed with Dr. Harvie Juniorodig present for the entire procedure    Candie EchevariaKevin Castillo, DO  08/14/2015    I was present for entire duration of procedure; discussed findings with resident and agree with recommendations above    Melodie BouillonMilan Sarely Stracener MD  Gastroenterology

## 2015-08-15 ENCOUNTER — Ambulatory Visit: Payer: Medicare Other | Admitting: Neurology

## 2015-08-15 LAB — CBC
Hematocrit: 25.6 % — ABNORMAL LOW (ref 34.0–48.0)
Hemoglobin: 7.5 g/dL — ABNORMAL LOW (ref 11.5–15.5)
MCH: 27.6 pg (ref 26.0–35.0)
MCHC: 29.3 % — ABNORMAL LOW (ref 32.0–34.5)
MCV: 94.1 fL (ref 80.0–99.9)
MPV: 9.8 fL (ref 7.0–12.0)
Platelets: 245 E9/L (ref 130–450)
RBC: 2.72 E12/L — ABNORMAL LOW (ref 3.50–5.50)
RDW: 14.8 fL (ref 11.5–15.0)
WBC: 9.2 E9/L (ref 4.5–11.5)

## 2015-08-15 LAB — PROTIME-INR
INR: 1.1
Protime: 12.8 s — ABNORMAL HIGH (ref 9.3–12.4)

## 2015-08-16 LAB — PREPARE RBC (CROSSMATCH)
Product Code Blood Bank: 0
Product Code Blood Bank: 0

## 2015-08-16 LAB — CBC WITH AUTO DIFFERENTIAL
Basophils %: 0.2 % (ref 0.0–2.0)
Basophils Absolute: 0.02 E9/L (ref 0.00–0.20)
Eosinophils %: 3.8 % (ref 0.0–6.0)
Eosinophils Absolute: 0.32 E9/L (ref 0.05–0.50)
Hematocrit: 28.2 % — ABNORMAL LOW (ref 34.0–48.0)
Hemoglobin: 8.2 g/dL — ABNORMAL LOW (ref 11.5–15.5)
Immature Granulocytes #: 0.09 E9/L
Immature Granulocytes %: 1.1 % (ref 0.0–5.0)
Lymphocytes %: 22.1 % (ref 20.0–42.0)
Lymphocytes Absolute: 1.87 E9/L (ref 1.50–4.00)
MCH: 27.8 pg (ref 26.0–35.0)
MCHC: 29.1 % — ABNORMAL LOW (ref 32.0–34.5)
MCV: 95.6 fL (ref 80.0–99.9)
MPV: 9.8 fL (ref 7.0–12.0)
Monocytes %: 7.2 % (ref 2.0–12.0)
Monocytes Absolute: 0.61 E9/L (ref 0.10–0.95)
Neutrophils %: 65.6 % (ref 43.0–80.0)
Neutrophils Absolute: 5.54 E9/L (ref 1.80–7.30)
Platelets: 259 E9/L (ref 130–450)
RBC: 2.95 E12/L — ABNORMAL LOW (ref 3.50–5.50)
RDW: 15.1 fL — ABNORMAL HIGH (ref 11.5–15.0)
WBC: 8.5 E9/L (ref 4.5–11.5)

## 2015-08-16 LAB — COMPREHENSIVE METABOLIC PANEL
ALT: 11 U/L (ref 0–32)
AST: 14 U/L (ref 0–31)
Albumin: 3 g/dL — ABNORMAL LOW (ref 3.5–5.2)
Alkaline Phosphatase: 49 U/L (ref 35–104)
Anion Gap: 11 mmol/L (ref 7–16)
BUN: 18 mg/dL (ref 8–23)
CO2: 26 mmol/L (ref 22–29)
Calcium: 7.8 mg/dL — ABNORMAL LOW (ref 8.6–10.2)
Chloride: 104 mmol/L (ref 98–107)
Creatinine: 0.6 mg/dL (ref 0.5–1.0)
GFR African American: >60
GFR Non-African American: >60 mL/min/{1.73_m2} (ref >=60)
Glucose: 151 mg/dL — ABNORMAL HIGH (ref 74–109)
Potassium: 4.1 mmol/L (ref 3.5–5.0)
Sodium: 141 mmol/L (ref 132–146)
Total Bilirubin: 0.3 mg/dL (ref 0.0–1.2)
Total Protein: 5.8 g/dL — ABNORMAL LOW (ref 6.4–8.3)

## 2015-08-16 LAB — PROTIME-INR
INR: 1.1
Protime: 12.4 s (ref 9.3–12.4)

## 2015-08-16 LAB — MAGNESIUM: Magnesium: 2.1 mg/dL (ref 1.6–2.6)

## 2015-08-16 LAB — PHOSPHORUS: Phosphorus: 3.7 mg/dL (ref 2.5–4.5)

## 2015-08-17 ENCOUNTER — Ambulatory Visit (INDEPENDENT_AMBULATORY_CARE_PROVIDER_SITE_OTHER): Payer: Medicare Other | Admitting: Neurology

## 2015-08-17 ENCOUNTER — Encounter: Payer: Self-pay | Admitting: Neurology

## 2015-08-17 VITALS — BP 149/66 | HR 65 | Ht 66.0 in | Wt 151.8 lb

## 2015-08-17 DIAGNOSIS — G441 Vascular headache, not elsewhere classified: Secondary | ICD-10-CM

## 2015-08-17 LAB — CULTURE, RESPIRATORY

## 2015-08-17 LAB — PROTIME-INR
INR: 1.4
Protime: 16.2 s — ABNORMAL HIGH (ref 9.3–12.4)

## 2015-08-17 MED ORDER — VERAPAMIL HCL ER 120 MG PO TBCR: 120.0000 mg | EXTENDED_RELEASE_TABLET | Freq: Every day | ORAL | Status: DC

## 2015-08-17 NOTE — Progress Notes (Signed)
[facility]    Provider:  Dr Jaynee Eagles Referring Provider: Manon Hilding, MD Primary Care Physician:  Manon Hilding, MD  CC: Headaches  HPI: Megan Daniel is a 80 y.o. female here as a follow up for headaches  Interval history: 08/16/2014. She is feeling better, no headaches. She is still on the Verapamil and no side effects. She stopped Methotrexate and she on prednisone prn, joint pain feels better.   Interval history 02/11/2014: She is feeling well. She has been on steroids for her RA.She stopped Methotrexate. No headaches. She is still on the verapamil. She takes tramadol prn. She has some chronic neuropathy that is mild.   Interval History 07/05/2014: Her headaches are improved. No real headaches since taking the prednisone and starting verapamil. She gets them possibly every once in a while, but mild. Her Verapamil pills changed colors but she is feeling the same, still helping. No side effects from the Verapamil. She is on methotrexate for Rheumatoid Arthritis and it is helping. She sees Dr. Amil Amen. Her joint pain was terrible. Her joint pains are better after a month of methotrexate. She still reports paresthesias in her feet.   Reviewed notes, labs and imaging from outside physicians, which showed: Labs in Dr Valora Piccolo office recently showed CMP/CBC unremarkable, Hepatitis panel negative. CCP Ab negative. CRP 11, high.   Previous Appointment [id number]/[id number]:   80 year old female who is here for evaluation of headaches. This summer started having headaches daily. Wakes up in the morning and it starts hurting in the right frontal/parietal areas. Can't describe the pain, "just hurts". Tylenol will make it feel better. Bright lights make it worse or loud noises make it worse too. Takes tylenol daily which usually takes care of it but may start up again later in the day. She covers up the left eye and she feels better. Ice pack didn't help. 3 weeks ago started having chills  and low grade fever and aching. Has Joint pain. RF+, ANA negative, sed rate normal but primary care is sending patient to rheumatology for evaluation. Recently diagnosed with lyme disease and took antibiotics. No rash. No tick seen. But had a bite and was given doxycycline. Has not had imaging of the brain. Lyme test was negative but had taken antibitoics before the bloodwork. Has had several insect and tick bites. The joint pain is migrating. Feels like her face is assymmetric, one side of her face is more droopy. Has pain in the hands, hips, hands, fingers, knees but not all at the same time. No pain in the shoulders. +fatigue, is so tiredthat even holding onto the steering wheel makes her fatigued. 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 No vision changes, no pain in the temples, no jaw claudication. Has paresthesias in her feet, feet feel hot and she has to put a cool washcloth on them and sleep outside the covers. Headaches can be severe, 7/10.    Review of Systems: Patient complains of symptoms per HPI as well as the following symptoms: Runny nose, cough, urgency, daytime sleepiness, numbness Pertinent negatives per HPI. All others negative.    Social History   Social History  . Marital Status: Divorced    Spouse Name: N/A  . Number of Children: 0  . Years of Education: RN   Occupational History  . Not on file.   Social History Main Topics  . Smoking status: Current Every  Day Smoker -- 0.50 packs/day    Types: Cigarettes  . Smokeless tobacco: Never Used     Comment: Quit 16 days ago after smoking for off and on for years  . Alcohol Use: 0.0 oz/week    0 Standard drinks or equivalent per week     Comment: Drinks wine  . Drug Use: No  . Sexual Activity: Not on file   Other Topics Concern  . Not on file   Social History Narrative   Patient is divorced, no children.   Patient is a retired Equities trader.    Patient is right handed.   Patient drinks 2 cups daily.    Family History  Problem Relation Age of Onset  . Alzheimer's disease Father   . Alzheimer's disease Sister   . Macular degeneration Sister   . Healthy Brother   . Arthritis Sister   . Arthritis Sister   . COPD Sister   . Heart disease Mother   . Seizures Sister   . Seizures Other     nephew  . Liver cancer Other   . Alcohol abuse Sister     Past Medical History  Diagnosis Date  . Hypertension   . Hyperchloremia   . Diverticulitis   . GERD (gastroesophageal reflux disease)   . Headache   . Fatigue   . Arthralgia 03/31/2014  . Allergic rhinitis   . Hypercholesterolemia   . Osteoporosis   . Abdominal pain   . Anxiety 10/03/2013  . Bronchitis 09/09/2013  . Cellulitis   . Headache   . Lumbar strain   . Hypothyroidism   . Rheumatoid arthritis ([place])   . Migraine     Past Surgical History  Procedure Laterality Date  . Total abdominal hysterectomy      Age 36  . Cholecystectomy      20 yrs ago  . Colonoscopy    . Hemorroidectomy      2005  . Polypectomy    . Upper gastrointestinal endoscopy    . Carpal tunnel release      25 years ago  . Colonoscopy N/A 10/25/2014    Procedure: COLONOSCOPY;  Surgeon: Rogene Houston, MD;  [facility];  Service: Endoscopy;  Laterality: N/A;  830  . Esophagogastroduodenoscopy N/A 10/25/2014    Procedure: ESOPHAGOGASTRODUODENOSCOPY (EGD);  Surgeon: Rogene Houston, MD;  [facility];  Service: Endoscopy;  Laterality: N/A;    Current Outpatient Prescriptions  Medication Sig Dispense Refill  . allopurinol (ZYLOPRIM) 300 MG tablet Take 300 mg by mouth daily.    . Calcium Carb-Cholecalciferol (CALCIUM 500 +D PO) Take 2 tablets by mouth daily.    . citalopram (CELEXA) 20 MG tablet Take 20 mg by mouth daily.    . cyclobenzaprine (FLEXERIL) 5 MG tablet Take 1 tablet (5 mg total) by mouth every 8 (eight) hours as needed for muscle spasms. 30 tablet 1  .  estradiol (ESTRACE) 0.1 MG/GM vaginal cream Place 1 Applicatorful vaginally daily.    . fexofenadine (ALLEGRA) 180 MG tablet Take 180 mg by mouth as needed for allergies or rhinitis.    Marland Kitchen levothyroxine (SYNTHROID, LEVOTHROID) 100 MCG tablet Take 100 mcg by mouth daily before breakfast.    . Melatonin 5 MG TABS Take 5 mg by mouth as needed (sleep).     . Multiple Vitamins-Minerals (PRESERVISION/LUTEIN PO) Take by mouth daily.    Marland Kitchen omeprazole (PRILOSEC OTC) 20 MG tablet Take 20 mg by mouth daily as needed (acid reflux).     Marland Kitchen  pimecrolimus (ELIDEL) 1 % cream Apply 1 application topically daily.    . Simethicone (GAS-X PO) Take 1 tablet by mouth as needed (Uses before eating raw veggies).    . traMADol (ULTRAM) 50 MG tablet Take 1 tablet (50 mg total) by mouth every 6 (six) hours as needed. 45 tablet 5  . verapamil (CALAN-SR) 120 MG CR tablet Take 1 tablet (120 mg total) by mouth at bedtime. 30 tablet 11   No current facility-administered medications for this visit.    Allergies as of 08/17/2015 - Review Complete 08/17/2015  Allergen Reaction Noted  . Ibuprofen Other (See Comments) 10/16/2014    Vitals: BP 149/66 mmHg  Pulse 65  Ht 5\' 6"  (1.676 m)  Wt 151 lb 12.8 oz (68.856 kg)  BMI 24.51 kg/m2 Last Weight:  Wt Readings from Last 1 Encounters:  08/17/15 151 lb 12.8 oz (68.856 kg)   Last Height:   Ht Readings from Last 1 Encounters:  08/17/15 5\' 6"  (1.676 m)      Cranial Nerves:  The pupils are equal, round, and reactive to light. The fundi are normal and spontaneous venous pulsations are present. Visual fields are full to finger confrontation. Extraocular movements are intact. Trigeminal sensation is intact and the muscles of mastication are normal. Possibly some right nasolabial flattening. The palate elevates in the midline. Voice is normal. Shoulder shrug is normal. The tongue has normal motion without fasciculations.   Coordination:  Normal finger to nose and heel to shin.  Normal rapid alternating movements.   Gait:  Heel-toe and tandem gait are normal.   Motor Observation:  No asymmetry, no atrophy, and no involuntary movements noted. Tone:  Normal muscle tone.   Posture:  Posture is normal. normal erect   Strength:  Strength is V/V in the upper and lower limbs.   Sensory: Decreased vibration, temp, pp in the lower extremities  Reflex Exam:  DTR's:  Deep tendon reflexes in the upper and lower extremities are normal bilaterally.  Toes:  The toes are downgoing bilaterally.  Clonus:  Clonus is absent.  Assessment/Plan: 63 y ear old female here for follow up of severe, worsening headaches now resolved. Has some migrainous features. Improved with medrol dose pack and Verapamil. Still reports paresthesias in her feet, stable.   Patient reported lyme disease and multiple tick bites and migratory joint pain, possible facial asymmetry and fatigue which prompted LP which was normal (neg lyme) and she was subsequently diagnosed with RA and is now on methotrexate and seeing Dr. Amil Amen. MRi of the brain unremarkable. Neuropathy panel showed HgbA1c 6.1,B12 nml. Neuro exam shows distal decrease in vibration, temp, pp in the lower extremities and emg/ncs showed mild sensory axonal polyneuropathy.  Continue Verapamil.   [name], MD  [facility] [address] [place], [place] [address]  Phone [phone] Fax [phone]  A total of 30 minutes was spent face-to-face with this patient. Over half this time was spent on counseling patient on the headache diagnosis and different diagnostic and therapeutic options available.

## 2015-08-17 NOTE — Patient Instructions (Signed)
Overall you are doing fairly well but I do want to suggest a few things today:   Remember to drink plenty of fluid, eat healthy meals and do not skip any meals. Try to eat protein with a every meal and eat a healthy snack such as fruit or nuts in between meals. Try to keep a regular sleep-wake schedule and try to exercise daily, particularly in the form of walking, 20-30 minutes a day, if you can.   I would like to see you back in 1 year, sooner if we need to. Please call us with any interim questions, concerns, problems, updates or refill requests.   Please also call us for any test results so we can go over those with you on the phone.  My clinical assistant and will answer any of your questions and relay your messages to me and also relay most of my messages to you.   Our phone number is [phone]. We also have an after hours call service for urgent matters and there is a physician on-call for urgent questions. For any emergencies you know to call 911 or go to the nearest emergency room

## 2015-08-24 DIAGNOSIS — M109 Gout, unspecified: Secondary | ICD-10-CM | POA: Diagnosis not present

## 2015-08-24 DIAGNOSIS — E7800 Pure hypercholesterolemia, unspecified: Secondary | ICD-10-CM | POA: Diagnosis not present

## 2015-08-24 DIAGNOSIS — G629 Polyneuropathy, unspecified: Secondary | ICD-10-CM | POA: Diagnosis not present

## 2015-08-24 DIAGNOSIS — R51 Headache: Secondary | ICD-10-CM | POA: Diagnosis not present

## 2015-08-24 DIAGNOSIS — I10 Essential (primary) hypertension: Secondary | ICD-10-CM | POA: Diagnosis not present

## 2015-08-24 DIAGNOSIS — M0609 Rheumatoid arthritis without rheumatoid factor, multiple sites: Secondary | ICD-10-CM | POA: Diagnosis not present

## 2015-08-24 DIAGNOSIS — E039 Hypothyroidism, unspecified: Secondary | ICD-10-CM | POA: Diagnosis not present

## 2015-08-24 DIAGNOSIS — R7301 Impaired fasting glucose: Secondary | ICD-10-CM | POA: Diagnosis not present

## 2015-09-28 DIAGNOSIS — S39012A Strain of muscle, fascia and tendon of lower back, initial encounter: Secondary | ICD-10-CM | POA: Diagnosis not present

## 2015-10-17 DIAGNOSIS — Z961 Presence of intraocular lens: Secondary | ICD-10-CM | POA: Diagnosis not present

## 2015-10-18 DIAGNOSIS — J209 Acute bronchitis, unspecified: Secondary | ICD-10-CM | POA: Diagnosis not present

## 2016-01-02 DIAGNOSIS — I872 Venous insufficiency (chronic) (peripheral): Secondary | ICD-10-CM | POA: Diagnosis not present

## 2016-01-30 ENCOUNTER — Encounter: Payer: Self-pay | Admitting: Vascular Surgery

## 2016-01-30 ENCOUNTER — Other Ambulatory Visit: Payer: Self-pay | Admitting: *Deleted

## 2016-01-30 DIAGNOSIS — I872 Venous insufficiency (chronic) (peripheral): Secondary | ICD-10-CM

## 2016-01-30 DIAGNOSIS — I83892 Varicose veins of left lower extremities with other complications: Secondary | ICD-10-CM

## 2016-02-20 DIAGNOSIS — E039 Hypothyroidism, unspecified: Secondary | ICD-10-CM | POA: Diagnosis not present

## 2016-02-20 DIAGNOSIS — I10 Essential (primary) hypertension: Secondary | ICD-10-CM | POA: Diagnosis not present

## 2016-02-20 DIAGNOSIS — E7800 Pure hypercholesterolemia, unspecified: Secondary | ICD-10-CM | POA: Diagnosis not present

## 2016-02-22 DIAGNOSIS — Z6824 Body mass index (BMI) 24.0-24.9, adult: Secondary | ICD-10-CM | POA: Diagnosis not present

## 2016-02-22 DIAGNOSIS — M109 Gout, unspecified: Secondary | ICD-10-CM | POA: Diagnosis not present

## 2016-02-22 DIAGNOSIS — E782 Mixed hyperlipidemia: Secondary | ICD-10-CM | POA: Diagnosis not present

## 2016-02-22 DIAGNOSIS — I10 Essential (primary) hypertension: Secondary | ICD-10-CM | POA: Diagnosis not present

## 2016-02-22 DIAGNOSIS — E039 Hypothyroidism, unspecified: Secondary | ICD-10-CM | POA: Diagnosis not present

## 2016-02-22 DIAGNOSIS — G629 Polyneuropathy, unspecified: Secondary | ICD-10-CM | POA: Diagnosis not present

## 2016-02-22 DIAGNOSIS — M0609 Rheumatoid arthritis without rheumatoid factor, multiple sites: Secondary | ICD-10-CM | POA: Diagnosis not present

## 2016-02-25 DIAGNOSIS — K5732 Diverticulitis of large intestine without perforation or abscess without bleeding: Secondary | ICD-10-CM | POA: Diagnosis not present

## 2016-02-25 DIAGNOSIS — Z6823 Body mass index (BMI) 23.0-23.9, adult: Secondary | ICD-10-CM | POA: Diagnosis not present

## 2016-03-02 ENCOUNTER — Other Ambulatory Visit: Payer: Self-pay | Admitting: Neurology

## 2016-04-02 DIAGNOSIS — M25441 Effusion, right hand: Secondary | ICD-10-CM | POA: Diagnosis not present

## 2016-04-02 DIAGNOSIS — R799 Abnormal finding of blood chemistry, unspecified: Secondary | ICD-10-CM | POA: Diagnosis not present

## 2016-04-02 DIAGNOSIS — M79642 Pain in left hand: Secondary | ICD-10-CM | POA: Diagnosis not present

## 2016-04-02 DIAGNOSIS — M25442 Effusion, left hand: Secondary | ICD-10-CM | POA: Diagnosis not present

## 2016-04-02 DIAGNOSIS — Z79899 Other long term (current) drug therapy: Secondary | ICD-10-CM | POA: Diagnosis not present

## 2016-04-02 DIAGNOSIS — M545 Low back pain: Secondary | ICD-10-CM | POA: Diagnosis not present

## 2016-04-02 DIAGNOSIS — M79641 Pain in right hand: Secondary | ICD-10-CM | POA: Diagnosis not present

## 2016-04-03 ENCOUNTER — Encounter: Payer: Self-pay | Admitting: Vascular Surgery

## 2016-04-04 ENCOUNTER — Ambulatory Visit (INDEPENDENT_AMBULATORY_CARE_PROVIDER_SITE_OTHER): Payer: Medicare Other | Admitting: Vascular Surgery

## 2016-04-04 ENCOUNTER — Ambulatory Visit (HOSPITAL_COMMUNITY)
Admission: RE | Admit: 2016-04-04 | Discharge: 2016-04-04 | Disposition: A | Discharge status: Home / Self Care | Payer: Medicare Other | Source: Ambulatory Visit | Attending: Vascular Surgery | Admitting: Vascular Surgery

## 2016-04-04 ENCOUNTER — Encounter: Payer: Self-pay | Admitting: Vascular Surgery

## 2016-04-04 VITALS — BP 137/74 | HR 67 | Temp 97.7°F | Resp 16 | Ht 66.0 in | Wt 145.0 lb

## 2016-04-04 DIAGNOSIS — I872 Venous insufficiency (chronic) (peripheral): Secondary | ICD-10-CM | POA: Diagnosis not present

## 2016-04-04 DIAGNOSIS — I83029 Varicose veins of left lower extremity with ulcer of unspecified site: Secondary | ICD-10-CM

## 2016-04-04 DIAGNOSIS — I83892 Varicose veins of left lower extremities with other complications: Secondary | ICD-10-CM | POA: Insufficient documentation

## 2016-04-04 DIAGNOSIS — L97929 Non-pressure chronic ulcer of unspecified part of left lower leg with unspecified severity: Secondary | ICD-10-CM

## 2016-04-04 NOTE — Progress Notes (Signed)
[name], female   DOB: [birth year], 80 y.o.   MRN: [id number]  Reason for Consult: New Evaluation (venous insuff)   Referred by Manon Hilding, MD  Subjective:     HPI:  Megan Daniel is a 80 y.o. female presents for evaluation of left leg varicose veins. She states she has had these veins at the level of her ankle for some time but now has worsening of spider veins. These do improve with elevation of her leg do not cause her any symptoms of pain or heaviness. She has never had DVT. 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 She has not attempted compression stockings but is interested.  Past Medical History:  Diagnosis Date  . Abdominal pain   . Allergic rhinitis   . Anxiety 10/03/2013  . Arthralgia 03/31/2014  . Bronchitis 09/09/2013  . Cellulitis   . Diverticulitis   . Fatigue   . GERD (gastroesophageal reflux disease)   . Headache   . Headache   . Hyperchloremia   . Hypercholesterolemia   . Hypertension   . Hypothyroidism   . Lumbar strain   . Migraine   . Osteoporosis   . Rheumatoid arthritis ([place])    Family History  Problem Relation Age of Onset  . Alzheimer's disease Father   . Alzheimer's disease Sister   . Macular degeneration Sister   . Healthy Brother   . Arthritis Sister   . Arthritis Sister   . COPD Sister   . Heart disease Mother   . Seizures Sister   . Seizures Other     nephew  . Liver cancer Other   . Alcohol abuse Sister    Past Surgical History:  Procedure Laterality Date  . CARPAL TUNNEL RELEASE     25 years ago  . CHOLECYSTECTOMY     20 yrs ago  . COLONOSCOPY    . COLONOSCOPY N/A 10/25/2014   Procedure: COLONOSCOPY;  Surgeon: Rogene Houston, MD;  [facility];  Service: Endoscopy;  Laterality: N/A;  830  . ESOPHAGOGASTRODUODENOSCOPY N/A 10/25/2014   Procedure: ESOPHAGOGASTRODUODENOSCOPY (EGD);  Surgeon: Rogene Houston, MD;  [facility];  Service: Endoscopy;   Laterality: N/A;  . HEMORROIDECTOMY     2005  . POLYPECTOMY    . TOTAL ABDOMINAL HYSTERECTOMY     Age 48  . UPPER GASTROINTESTINAL ENDOSCOPY      Short Social History:  Social History  Substance Use Topics  . Smoking status: Current Some Day Smoker    Packs/day: 0.50    Types: Cigarettes  . Smokeless tobacco: Never Used     Comment: Quit 16 days ago after smoking for off and on for years  . Alcohol use 0.0 oz/week     Comment: Drinks wine    Allergies  Allergen Reactions  . Ibuprofen Other (See Comments)    heartburn    Current Outpatient Prescriptions  Medication Sig Dispense Refill  . Calcium Carb-Cholecalciferol (CALCIUM 500 +D PO) Take 2 tablets by mouth daily.    . citalopram (CELEXA) 20 MG tablet Take 20 mg by mouth daily.    . cyclobenzaprine (FLEXERIL) 5 MG tablet TAKE 1 TABLET BY MOUTH EVERY 8 HOURS AS NEEDED FOR MUSCLE SPASMS 30 tablet 4  . estradiol (ESTRACE) 0.1 MG/GM vaginal cream Place 1 Applicatorful vaginally daily.    . fexofenadine (ALLEGRA) 180 MG tablet Take 180 mg by mouth as needed for  allergies or rhinitis.    Marland Kitchen levothyroxine (SYNTHROID, LEVOTHROID) 100 MCG tablet Take 100 mcg by mouth daily before breakfast.    . meloxicam (MOBIC) 7.5 MG tablet Take 7.5 mg by mouth as needed for pain.    . Multiple Vitamins-Minerals (PRESERVISION/LUTEIN PO) Take by mouth daily.    Marland Kitchen omeprazole (PRILOSEC OTC) 20 MG tablet Take 20 mg by mouth daily as needed (acid reflux).     . pimecrolimus (ELIDEL) 1 % cream Apply 1 application topically daily.    . traMADol (ULTRAM) 50 MG tablet Take 1 tablet (50 mg total) by mouth every 6 (six) hours as needed. 45 tablet 5  . verapamil (CALAN-SR) 120 MG CR tablet Take 1 tablet (120 mg total) by mouth at bedtime. 30 tablet 11  . allopurinol (ZYLOPRIM) 300 MG tablet Take 300 mg by mouth daily.    . Melatonin 5 MG TABS Take 5 mg by mouth as needed (sleep).     . Simethicone (GAS-X PO) Take 1 tablet by mouth as needed (Uses before  eating raw veggies).     No current facility-administered medications for this visit.     Review of Systems  Constitutional:  Constitutional negative. Eyes: Eyes negative.  Respiratory: Respiratory negative.  Cardiovascular: Cardiovascular negative.  GI: Gastrointestinal negative.  Musculoskeletal: Musculoskeletal negative.  Skin: Skin negative.  Neurological: Neurological negative. Psychiatric: Psychiatric negative.        Objective:  Objective   Vitals:   04/04/16 0908  BP: 137/74  Pulse: 67  Resp: 16  Temp: 97.7 F (36.5 C)  SpO2: 97%  Weight: 145 lb (65.8 kg)  Height: 5\' 6"  (1.676 m)   Body mass index is 23.4 kg/m.  Physical Exam  Constitutional: She is oriented to person, place, and time. She appears well-developed.  Eyes: Pupils are equal, round, and reactive to light.  Cardiovascular: Normal rate.   Pulmonary/Chest: Effort normal.  Abdominal: Soft.  Musculoskeletal: Normal range of motion.  Reticular veins at left ankle, few varicose vein mid calf  Neurological: She is alert and oriented to person, place, and time.  Skin: Skin is warm.    Data: Reflux and left common and superficial femoral vein proximal greater saphenous vein as well as saphenofemoral junction greatest dimension 0.51 cm proximal.     Assessment/Plan:   80 year old white female with varicose veins left lower extremity and reticular veins of the left ankle. She'll be given compression 3 months and was instructed on wearing this along with elevation of her legs when she comment. She is interested in losing the vein clinic to discuss greater saphenous vein ablation possible injection of her reticular veins.     [name] MD [facility]

## 2016-06-09 DIAGNOSIS — Z23 Encounter for immunization: Secondary | ICD-10-CM | POA: Diagnosis not present

## 2016-06-20 DIAGNOSIS — E7800 Pure hypercholesterolemia, unspecified: Secondary | ICD-10-CM | POA: Diagnosis not present

## 2016-06-20 DIAGNOSIS — E039 Hypothyroidism, unspecified: Secondary | ICD-10-CM | POA: Diagnosis not present

## 2016-06-20 DIAGNOSIS — R7301 Impaired fasting glucose: Secondary | ICD-10-CM | POA: Diagnosis not present

## 2016-06-20 DIAGNOSIS — I10 Essential (primary) hypertension: Secondary | ICD-10-CM | POA: Diagnosis not present

## 2016-06-20 DIAGNOSIS — E782 Mixed hyperlipidemia: Secondary | ICD-10-CM | POA: Diagnosis not present

## 2016-06-24 DIAGNOSIS — G629 Polyneuropathy, unspecified: Secondary | ICD-10-CM | POA: Diagnosis not present

## 2016-06-24 DIAGNOSIS — F3342 Major depressive disorder, recurrent, in full remission: Secondary | ICD-10-CM | POA: Diagnosis not present

## 2016-06-24 DIAGNOSIS — I10 Essential (primary) hypertension: Secondary | ICD-10-CM | POA: Diagnosis not present

## 2016-06-24 DIAGNOSIS — E782 Mixed hyperlipidemia: Secondary | ICD-10-CM | POA: Diagnosis not present

## 2016-06-24 DIAGNOSIS — M0609 Rheumatoid arthritis without rheumatoid factor, multiple sites: Secondary | ICD-10-CM | POA: Diagnosis not present

## 2016-06-24 DIAGNOSIS — E039 Hypothyroidism, unspecified: Secondary | ICD-10-CM | POA: Diagnosis not present

## 2016-06-24 DIAGNOSIS — M109 Gout, unspecified: Secondary | ICD-10-CM | POA: Diagnosis not present

## 2016-06-24 DIAGNOSIS — Z6823 Body mass index (BMI) 23.0-23.9, adult: Secondary | ICD-10-CM | POA: Diagnosis not present

## 2016-07-02 ENCOUNTER — Encounter: Payer: Self-pay | Admitting: Vascular Surgery

## 2016-07-03 DIAGNOSIS — M545 Low back pain: Secondary | ICD-10-CM | POA: Diagnosis not present

## 2016-07-03 DIAGNOSIS — M19041 Primary osteoarthritis, right hand: Secondary | ICD-10-CM | POA: Diagnosis not present

## 2016-07-03 DIAGNOSIS — F17200 Nicotine dependence, unspecified, uncomplicated: Secondary | ICD-10-CM | POA: Diagnosis not present

## 2016-07-03 DIAGNOSIS — M19042 Primary osteoarthritis, left hand: Secondary | ICD-10-CM | POA: Diagnosis not present

## 2016-07-08 ENCOUNTER — Encounter: Payer: Self-pay | Admitting: Vascular Surgery

## 2016-07-08 ENCOUNTER — Ambulatory Visit (INDEPENDENT_AMBULATORY_CARE_PROVIDER_SITE_OTHER): Payer: Medicare Other | Admitting: Vascular Surgery

## 2016-07-08 VITALS — BP 144/79 | HR 75 | Resp 16 | Ht 66.0 in | Wt 147.0 lb

## 2016-07-08 DIAGNOSIS — I83892 Varicose veins of left lower extremities with other complications: Secondary | ICD-10-CM | POA: Diagnosis not present

## 2016-07-08 NOTE — Progress Notes (Signed)
Subjective:     [name], female   DOB: May 20, 1936, 80 y.o.   MRN: [id number]  HPI This 80 year old female returns having been evaluated by Dr. Servando Snare 3 months ago for painful varicosities in the left leg. Patient has had some swelling in both legs left worse than right but not severe. She has noticed some darkening of the skin in the left ankle area. She is also noticed some small bulges. She does have some discomfort as the day progresses. She has no history of DVT thrombophlebitis stasis ulcers or bleeding.  Past Medical History:  Diagnosis Date  . Abdominal pain   . Allergic rhinitis   . Anxiety 10/03/2013  . Arthralgia 03/31/2014  . Bronchitis 09/09/2013  . Cellulitis   . Diverticulitis   . Fatigue   . GERD (gastroesophageal reflux disease)   . Headache   . Headache   . Hyperchloremia   . Hypercholesterolemia   . Hypertension   . Hypothyroidism   . Lumbar strain   . Migraine   . Osteoporosis   . Rheumatoid arthritis [facility])     Social History  Substance Use Topics  . Smoking status: Current Some Day Smoker    Packs/day: 0.50    Types: Cigarettes  . Smokeless tobacco: Never Used     Comment: Quit 16 days ago after smoking for off and on for years  . Alcohol use 0.0 oz/week     Comment: Drinks wine    Family History  Problem Relation Age of Onset  . Alzheimer's disease Father   . Alzheimer's disease Sister   . Macular degeneration Sister   . Healthy Brother   . Arthritis Sister   . Arthritis Sister   . COPD Sister   . Heart disease Mother   . Seizures Sister   . Seizures Other     nephew  . Liver cancer Other   . Alcohol abuse Sister     Allergies  Allergen Reactions  . Ibuprofen Other (See Comments)    heartburn     Current Outpatient Prescriptions:  .  citalopram (CELEXA) 20 MG tablet, Take 20 mg by mouth daily., Disp: , Rfl:  .  cyclobenzaprine (FLEXERIL) 5 MG tablet, TAKE 1 TABLET BY MOUTH EVERY 8 HOURS AS NEEDED FOR  MUSCLE SPASMS, Disp: 30 tablet, Rfl: 4 .  estradiol (ESTRACE) 0.1 MG/GM vaginal cream, Place 1 Applicatorful vaginally daily., Disp: , Rfl:  .  fexofenadine (ALLEGRA) 180 MG tablet, Take 180 mg by mouth as needed for allergies or rhinitis., Disp: , Rfl:  .  levothyroxine (SYNTHROID, LEVOTHROID) 100 MCG tablet, Take 100 mcg by mouth daily before breakfast., Disp: , Rfl:  .  meloxicam (MOBIC) 7.5 MG tablet, Take 7.5 mg by mouth as needed for pain., Disp: , Rfl:  .  Multiple Vitamins-Minerals (PRESERVISION/LUTEIN PO), Take by mouth daily., Disp: , Rfl:  .  pimecrolimus (ELIDEL) 1 % cream, Apply 1 application topically daily., Disp: , Rfl:  .  traMADol (ULTRAM) 50 MG tablet, Take 1 tablet (50 mg total) by mouth every 6 (six) hours as needed., Disp: 45 tablet, Rfl: 5 .  verapamil (CALAN-SR) 120 MG CR tablet, Take 1 tablet (120 mg total) by mouth at bedtime., Disp: 30 tablet, Rfl: 11 .  allopurinol (ZYLOPRIM) 300 MG tablet, Take 300 mg by mouth daily., Disp: , Rfl:  .  Calcium Carb-Cholecalciferol (CALCIUM 500 +D PO), Take 2 tablets by mouth daily., Disp: , Rfl:  .  Melatonin 5 MG  TABS, Take 5 mg by mouth as needed (sleep). , Disp: , Rfl:  .  omeprazole (PRILOSEC OTC) 20 MG tablet, Take 20 mg by mouth daily as needed (acid reflux). , Disp: , Rfl:  .  Simethicone (GAS-X PO), Take 1 tablet by mouth as needed (Uses before eating raw veggies)., Disp: , Rfl:   Vitals:   07/08/16 1530  BP: (!) 144/79  Pulse: 75  Resp: 16  SpO2: 99%  Weight: 147 lb (66.7 kg)  Height: 5\' 6"  (1.676 m)    Body mass index is 23.73 kg/m.         Review of Systems   denies chest pain, dyspnea on exertion, PND, orthopnea, hemoptysis Objective:   Physical Exam BP (!) 144/79 (BP Location: Left Arm, Patient Position: Sitting, Cuff Size: Normal)   Pulse 75   Resp 16   Ht 5\' 6"  (1.676 m)   Wt 147 lb (66.7 kg)   SpO2 99%   BMI 23.73 kg/m   Gen. well-developed well-nourished female no apparent distress alert and  oriented 3 Lungs no rhonchi or wheezing Left leg with a few small bulges in the medial calf lower third and a few isolated varicosities in the medial thigh midportion. Some darkening of skin with no ulceration around left medial malleolus. 3+ dorsalis pedis pulse  Right leg free of varicosities with good pulse palpable.  I reviewed the ultrasound study which was performed last visit on 04/04/2016. This does reveal reflux in the left great saphenous vein but it is a small caliber vein with its largest area being 0.51 in the mid calf. There is no DVT.     Assessment:     Painful varicosities left leg with mild edema but no evidence of enlarged left great or small saphenous vein. There is reflux in left great saphenous vein.    Plan:     No intervention indicated but have offered patient foam sclerotherapy if she would like to have varicosities treated and she will consider this

## 2016-08-13 ENCOUNTER — Encounter (INDEPENDENT_AMBULATORY_CARE_PROVIDER_SITE_OTHER): Payer: Self-pay | Admitting: Internal Medicine

## 2016-08-28 DIAGNOSIS — S0101XA Laceration without foreign body of scalp, initial encounter: Secondary | ICD-10-CM | POA: Diagnosis not present

## 2016-09-16 ENCOUNTER — Encounter: Payer: Self-pay | Admitting: *Deleted

## 2016-09-24 ENCOUNTER — Ambulatory Visit (INDEPENDENT_AMBULATORY_CARE_PROVIDER_SITE_OTHER): Payer: Self-pay | Admitting: *Deleted

## 2016-09-24 DIAGNOSIS — I83892 Varicose veins of left lower extremities with other complications: Secondary | ICD-10-CM

## 2016-09-24 NOTE — Progress Notes (Signed)
X=.3% Sotradecol administered with a 27g butterfly.  Patient received a total of 6cc.  Pt most concerned about spiders at L inner ankle. Easy access. Tol well. Also injected two reticulars above the ankle area. 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 Follow prn.   Compression stockings applied: Yes.  and applied 4X4"s and 4" ace wrap.

## 2016-09-30 ENCOUNTER — Encounter: Payer: Self-pay | Admitting: Vascular Surgery

## 2016-11-24 ENCOUNTER — Ambulatory Visit (INDEPENDENT_AMBULATORY_CARE_PROVIDER_SITE_OTHER): Payer: Medicare Other | Admitting: Internal Medicine

## 2016-11-24 ENCOUNTER — Encounter (INDEPENDENT_AMBULATORY_CARE_PROVIDER_SITE_OTHER): Payer: Self-pay | Admitting: Internal Medicine

## 2016-11-24 VITALS — BP 130/80 | HR 70 | Temp 98.3°F | Resp 18 | Ht 66.0 in | Wt 146.7 lb

## 2016-11-24 DIAGNOSIS — K219 Gastro-esophageal reflux disease without esophagitis: Secondary | ICD-10-CM | POA: Diagnosis not present

## 2016-11-24 DIAGNOSIS — K220 Achalasia of cardia: Secondary | ICD-10-CM | POA: Diagnosis not present

## 2016-11-24 NOTE — Patient Instructions (Addendum)
Call if you have swallowing difficulty or abdominal pain

## 2016-11-24 NOTE — Progress Notes (Signed)
Presenting complaint;  Follow-up for GERD and achalasia.  Database and Subjective:  Patient is 81 year old Caucasian female who has chronic GERD and history of achalasia and is here for scheduled visit. She had EGD in March 2016 and no mucosal abdomen at 2 noted to esophagus. She has history of colonic polyps and also underwent colonoscopy in March 2016 with removal of single polyp returned out to be hyperplastic. When she was seen in June 2016 she was taking omeprazole. She is not taking omeprazole anymore.  Patient states she is doing well. She decided to try raw vinegar and has been watching her diet. She stopped omeprazole several months back and not having any flareups. She denies dysphagia nausea or vomiting. She states she has not been treated for diverticulitis for more than one year. She denies abdominal pain melena or rectal bleeding. Her bowels move every day. She took a few doses of meloxicam for acute back pain but hasn't used any in the past few weeks.    Current Medications: Outpatient Encounter Prescriptions as of 11/24/2016  Medication Sig  . Calcium Carb-Cholecalciferol (CALCIUM 500 +D PO) Take 2 tablets by mouth daily.  . citalopram (CELEXA) 20 MG tablet Take 20 mg by mouth daily.  Marland Kitchen estradiol (ESTRACE) 0.1 MG/GM vaginal cream Place 1 Applicatorful vaginally daily.  . fexofenadine (ALLEGRA) 180 MG tablet Take 180 mg by mouth as needed for allergies or rhinitis.  Marland Kitchen FLUTICASONE PROPIONATE, NASAL, NA Place 50 mcg into the nose as needed.  Marland Kitchen levothyroxine (SYNTHROID, LEVOTHROID) 100 MCG tablet Take 100 mcg by mouth daily before breakfast.  . meloxicam (MOBIC) 7.5 MG tablet Take 7.5 mg by mouth as needed for pain.  . Multiple Vitamins-Minerals (PRESERVISION/LUTEIN PO) Take by mouth daily.  Marland Kitchen OVER THE COUNTER MEDICATION Raw Vinegar (with Mother) Patient states that she takes 1 teaspoon three times daily ,before meals: for Gerd,digestive problems  . pimecrolimus (ELIDEL) 1 %  cream Apply 1 application topically daily.  . traMADol (ULTRAM) 50 MG tablet Take 1 tablet (50 mg total) by mouth every 6 (six) hours as needed.  . verapamil (CALAN-SR) 120 MG CR tablet Take 1 tablet (120 mg total) by mouth at bedtime.  . [DISCONTINUED] allopurinol (ZYLOPRIM) 300 MG tablet Take 300 mg by mouth daily.  . [DISCONTINUED] cyclobenzaprine (FLEXERIL) 5 MG tablet TAKE 1 TABLET BY MOUTH EVERY 8 HOURS AS NEEDED FOR MUSCLE SPASMS (Patient not taking: Reported on 11/24/2016)  . [DISCONTINUED] Melatonin 5 MG TABS Take 5 mg by mouth as needed (sleep).   . [DISCONTINUED] omeprazole (PRILOSEC OTC) 20 MG tablet Take 20 mg by mouth daily as needed (acid reflux).   . [DISCONTINUED] Simethicone (GAS-X PO) Take 1 tablet by mouth as needed (Uses before eating raw veggies).   No facility-administered encounter medications on file as of 11/24/2016.      Objective: Blood pressure 130/80, pulse 70, temperature 98.3 F (36.8 C), temperature source Oral, resp. rate 18, height 5\' 6"  (1.676 m), weight 146 lb 11.2 oz (66.5 kg). Patient is alert and in no acute distress. Conjunctiva is pink. Sclera is nonicteric Oropharyngeal mucosa is normal. No neck masses or thyromegaly noted. Cardiac exam with regular rhythm normal S1 and S2. No murmur or gallop noted. Lungs are clear to auscultation. Abdomen is symmetrical soft and nontender without organomegaly or masses. No LE edema or clubbing noted.   Assessment:  #1. History of achalasia. She underwent pneumatic dilation at [facility] in 1994. Last surveillance EGD was about 2 years ago and was  unremarkable. She is inclined not to pursue with further EGDs unless she has symptoms. #2. GERD. Post pneumatic dilation for achalasia she was having frequent heartburn and required omeprazole. Now her symptoms are controlled with dietary measures and vinegar. There is no need to go back on PPI or H2B as long as current approach is working.   Plan:  Patient will call if  she has abdominal pain or dysphagia or if dietary measures and vinegar stopped controlling her GERD symptoms Office visit in 2 years.

## 2016-11-25 ENCOUNTER — Ambulatory Visit (INDEPENDENT_AMBULATORY_CARE_PROVIDER_SITE_OTHER): Payer: Medicare Other | Admitting: Internal Medicine

## 2016-12-08 DIAGNOSIS — Z6823 Body mass index (BMI) 23.0-23.9, adult: Secondary | ICD-10-CM | POA: Diagnosis not present

## 2016-12-08 DIAGNOSIS — J209 Acute bronchitis, unspecified: Secondary | ICD-10-CM | POA: Diagnosis not present

## 2016-12-08 DIAGNOSIS — R05 Cough: Secondary | ICD-10-CM | POA: Diagnosis not present

## 2017-02-17 DIAGNOSIS — Z0001 Encounter for general adult medical examination with abnormal findings: Secondary | ICD-10-CM | POA: Diagnosis not present

## 2017-02-17 DIAGNOSIS — I10 Essential (primary) hypertension: Secondary | ICD-10-CM | POA: Diagnosis not present

## 2017-02-17 DIAGNOSIS — Z6823 Body mass index (BMI) 23.0-23.9, adult: Secondary | ICD-10-CM | POA: Diagnosis not present

## 2017-02-17 DIAGNOSIS — E039 Hypothyroidism, unspecified: Secondary | ICD-10-CM | POA: Diagnosis not present

## 2017-02-17 DIAGNOSIS — Z1389 Encounter for screening for other disorder: Secondary | ICD-10-CM | POA: Diagnosis not present

## 2017-02-17 DIAGNOSIS — M109 Gout, unspecified: Secondary | ICD-10-CM | POA: Diagnosis not present

## 2017-02-17 DIAGNOSIS — E782 Mixed hyperlipidemia: Secondary | ICD-10-CM | POA: Diagnosis not present

## 2017-02-17 DIAGNOSIS — M0609 Rheumatoid arthritis without rheumatoid factor, multiple sites: Secondary | ICD-10-CM | POA: Diagnosis not present

## 2017-02-23 ENCOUNTER — Other Ambulatory Visit: Payer: Self-pay

## 2017-05-19 DIAGNOSIS — E039 Hypothyroidism, unspecified: Secondary | ICD-10-CM | POA: Diagnosis not present

## 2017-05-19 DIAGNOSIS — E7800 Pure hypercholesterolemia, unspecified: Secondary | ICD-10-CM | POA: Diagnosis not present

## 2017-05-19 DIAGNOSIS — R7301 Impaired fasting glucose: Secondary | ICD-10-CM | POA: Diagnosis not present

## 2017-05-19 DIAGNOSIS — I10 Essential (primary) hypertension: Secondary | ICD-10-CM | POA: Diagnosis not present

## 2017-05-19 DIAGNOSIS — K5732 Diverticulitis of large intestine without perforation or abscess without bleeding: Secondary | ICD-10-CM | POA: Diagnosis not present

## 2017-05-19 DIAGNOSIS — E782 Mixed hyperlipidemia: Secondary | ICD-10-CM | POA: Diagnosis not present

## 2017-05-21 DIAGNOSIS — Z1212 Encounter for screening for malignant neoplasm of rectum: Secondary | ICD-10-CM | POA: Diagnosis not present

## 2017-05-21 DIAGNOSIS — G629 Polyneuropathy, unspecified: Secondary | ICD-10-CM | POA: Diagnosis not present

## 2017-05-21 DIAGNOSIS — Z23 Encounter for immunization: Secondary | ICD-10-CM | POA: Diagnosis not present

## 2017-05-21 DIAGNOSIS — M0609 Rheumatoid arthritis without rheumatoid factor, multiple sites: Secondary | ICD-10-CM | POA: Diagnosis not present

## 2017-05-21 DIAGNOSIS — E039 Hypothyroidism, unspecified: Secondary | ICD-10-CM | POA: Diagnosis not present

## 2017-05-21 DIAGNOSIS — I10 Essential (primary) hypertension: Secondary | ICD-10-CM | POA: Diagnosis not present

## 2017-05-21 DIAGNOSIS — M109 Gout, unspecified: Secondary | ICD-10-CM | POA: Diagnosis not present

## 2017-05-21 DIAGNOSIS — E782 Mixed hyperlipidemia: Secondary | ICD-10-CM | POA: Diagnosis not present

## 2017-06-24 DIAGNOSIS — L03211 Cellulitis of face: Secondary | ICD-10-CM | POA: Diagnosis not present

## 2017-06-24 DIAGNOSIS — Z6824 Body mass index (BMI) 24.0-24.9, adult: Secondary | ICD-10-CM | POA: Diagnosis not present

## 2017-10-06 DIAGNOSIS — S0012XA Contusion of left eyelid and periocular area, initial encounter: Secondary | ICD-10-CM | POA: Diagnosis not present

## 2017-10-06 DIAGNOSIS — H1132 Conjunctival hemorrhage, left eye: Secondary | ICD-10-CM | POA: Diagnosis not present

## 2017-10-06 DIAGNOSIS — Z961 Presence of intraocular lens: Secondary | ICD-10-CM | POA: Diagnosis not present

## 2017-10-06 DIAGNOSIS — H43811 Vitreous degeneration, right eye: Secondary | ICD-10-CM | POA: Diagnosis not present

## 2017-10-21 DIAGNOSIS — H1132 Conjunctival hemorrhage, left eye: Secondary | ICD-10-CM | POA: Diagnosis not present

## 2017-10-21 DIAGNOSIS — Z961 Presence of intraocular lens: Secondary | ICD-10-CM | POA: Diagnosis not present

## 2017-10-21 DIAGNOSIS — S0012XA Contusion of left eyelid and periocular area, initial encounter: Secondary | ICD-10-CM | POA: Diagnosis not present

## 2017-10-21 DIAGNOSIS — H43811 Vitreous degeneration, right eye: Secondary | ICD-10-CM | POA: Diagnosis not present

## 2017-11-12 ENCOUNTER — Encounter: Payer: Self-pay | Admitting: Neurology

## 2017-11-12 ENCOUNTER — Telehealth: Payer: Self-pay | Admitting: Neurology

## 2017-11-12 NOTE — Telephone Encounter (Signed)
FYI  Pt has been rescheduled for 11-19-2017 for an 11:15 appointment.  Pt is aware she needs to be checked in at 10:45. No call back requested

## 2017-11-12 NOTE — Telephone Encounter (Signed)
Pt has called to reschedule her appointment due to provider not being available.  Pt has expressed her disappointment in being told 1st available is not until the 12th of June.  Pt is on wait list but due to her migraines getting worse, the neuropathy in her hands and feet and the funny feeling in her feet and her falling she would like to be called if she can be seen before the month of June.

## 2017-11-17 ENCOUNTER — Institutional Professional Consult (permissible substitution): Payer: Medicare Other | Admitting: Neurology

## 2017-11-17 DIAGNOSIS — Z6823 Body mass index (BMI) 23.0-23.9, adult: Secondary | ICD-10-CM | POA: Diagnosis not present

## 2017-11-17 DIAGNOSIS — F3342 Major depressive disorder, recurrent, in full remission: Secondary | ICD-10-CM | POA: Diagnosis not present

## 2017-11-17 DIAGNOSIS — M0609 Rheumatoid arthritis without rheumatoid factor, multiple sites: Secondary | ICD-10-CM | POA: Diagnosis not present

## 2017-11-17 DIAGNOSIS — G629 Polyneuropathy, unspecified: Secondary | ICD-10-CM | POA: Diagnosis not present

## 2017-11-17 DIAGNOSIS — I10 Essential (primary) hypertension: Secondary | ICD-10-CM | POA: Diagnosis not present

## 2017-11-17 DIAGNOSIS — E039 Hypothyroidism, unspecified: Secondary | ICD-10-CM | POA: Diagnosis not present

## 2017-11-17 DIAGNOSIS — M109 Gout, unspecified: Secondary | ICD-10-CM | POA: Diagnosis not present

## 2017-11-17 DIAGNOSIS — E782 Mixed hyperlipidemia: Secondary | ICD-10-CM | POA: Diagnosis not present

## 2017-11-19 ENCOUNTER — Encounter: Payer: Self-pay | Admitting: Neurology

## 2017-11-19 ENCOUNTER — Ambulatory Visit (INDEPENDENT_AMBULATORY_CARE_PROVIDER_SITE_OTHER): Payer: Medicare Other | Admitting: Neurology

## 2017-11-19 ENCOUNTER — Encounter: Payer: Self-pay | Admitting: *Deleted

## 2017-11-19 VITALS — BP 139/69 | HR 63 | Ht 66.0 in | Wt 151.0 lb

## 2017-11-19 DIAGNOSIS — G5600 Carpal tunnel syndrome, unspecified upper limb: Secondary | ICD-10-CM | POA: Diagnosis not present

## 2017-11-19 DIAGNOSIS — G629 Polyneuropathy, unspecified: Secondary | ICD-10-CM | POA: Diagnosis not present

## 2017-11-19 DIAGNOSIS — G5602 Carpal tunnel syndrome, left upper limb: Secondary | ICD-10-CM | POA: Insufficient documentation

## 2017-11-19 DIAGNOSIS — G609 Hereditary and idiopathic neuropathy, unspecified: Secondary | ICD-10-CM | POA: Diagnosis not present

## 2017-11-19 DIAGNOSIS — R2689 Other abnormalities of gait and mobility: Secondary | ICD-10-CM

## 2017-11-19 NOTE — Patient Instructions (Addendum)
Physical therapy EMG/NCS   Peripheral Neuropathy Peripheral neuropathy is a type of nerve damage. It affects nerves that carry signals between the spinal cord and other parts of the body. These are called peripheral nerves. With peripheral neuropathy, one nerve or a group of nerves may be damaged. What are the causes? Many things can damage peripheral nerves. For some people with peripheral neuropathy, the cause is unknown. Some causes include:  Diabetes. This is the most common cause of peripheral neuropathy.  Injury to a nerve.  Pressure or stress on a nerve that lasts a long time.  Too little vitamin B. Alcoholism can lead to this.  Infections.  Autoimmune diseases, such as multiple sclerosis, Rheumatoid Arthritis and systemic lupus erythematosus.  Inherited nerve diseases.  Some medicines, such as cancer drugs.  Toxic substances, such as lead and mercury.  Too little blood flowing to the legs.  Kidney disease.  Thyroid disease.  What are the signs or symptoms? Different people have different symptoms. The symptoms you have will depend on which of your nerves is damaged. Common symptoms include:  Loss of feeling (numbness) in the feet and hands.  Tingling in the feet and hands.  Pain that burns.  Very sensitive skin.  Weakness.  Not being able to move a part of the body (paralysis).  Muscle twitching.  Clumsiness or poor coordination.  Loss of balance.  Not being able to control your bladder.  Feeling dizzy.  Sexual problems.  How is this diagnosed? Peripheral neuropathy is a symptom, not a disease. Finding the cause of peripheral neuropathy can be hard. To figure that out, your health care provider will take a medical history and do a physical exam. A neurological exam will also be done. This involves checking things affected by your brain, spinal cord, and nerves (nervous system). For example, your health care provider will check your reflexes, how  you move, and what you can feel. Other types of tests may also be ordered, such as:  Blood tests.  A test of the fluid in your spinal cord.  Imaging tests, such as CT scans or an MRI.  Electromyography (EMG). This test checks the nerves that control muscles.  Nerve conduction velocity tests. These tests check how fast messages pass through your nerves.  Nerve biopsy. A small piece of nerve is removed. It is then checked under a microscope.  How is this treated?  Medicine is often used to treat peripheral neuropathy. Medicines may include: ? Pain-relieving medicines. Prescription or over-the-counter medicine may be suggested. ? Antiseizure medicine. This may be used for pain. ? Antidepressants. These also may help ease pain from neuropathy. ? Lidocaine. This is a numbing medicine. You might wear a patch or be given a shot. ? Mexiletine. This medicine is typically used to help control irregular heart rhythms.  Surgery. Surgery may be needed to relieve pressure on a nerve or to destroy a nerve that is causing pain.  Physical therapy to help movement.  Assistive devices to help movement. Follow these instructions at home:  Only take over-the-counter or prescription medicines as directed by your health care provider. Follow the instructions carefully for any given medicines. Do not take any other medicines without first getting approval from your health care provider.  If you have diabetes, work closely with your health care provider to keep your blood sugar under control.  If you have numbness in your feet: ? Check every day for signs of injury or infection. Watch for redness, warmth, and  swelling. ? Wear padded socks and comfortable shoes. These help protect your feet.  Do not do things that put pressure on your damaged nerve.  Do not smoke. Smoking keeps blood from getting to damaged nerves.  Avoid or limit alcohol. Too much alcohol can cause a lack of B vitamins. These  vitamins are needed for healthy nerves.  Develop a good support system. Coping with peripheral neuropathy can be stressful. Talk to a mental health specialist or join a support group if you are struggling.  Follow up with your health care provider as directed. Contact a health care provider if:  You have new signs or symptoms of peripheral neuropathy.  You are struggling emotionally from dealing with peripheral neuropathy.  You have a fever. Get help right away if:  You have an injury or infection that is not healing.  You feel very dizzy or begin vomiting.  You have chest pain.  You have trouble breathing. This information is not intended to replace advice given to you by your health care provider. Make sure you discuss any questions you have with your health care provider. Document Released: 07/11/2002 Document Revised: 12/27/2015 Document Reviewed: 03/28/2013 Elsevier Interactive Patient Education  2017 [place] Syndrome Carpal tunnel syndrome is a condition that causes pain in your hand and arm. The carpal tunnel is a narrow area located on the palm side of your wrist. Repeated wrist motion or certain diseases may cause swelling within the tunnel. This swelling pinches the main nerve in the wrist (median nerve). What are the causes? This condition may be caused by:  Repeated wrist motions.  Wrist injuries.  Arthritis.  A cyst or tumor in the carpal tunnel.  Fluid buildup during pregnancy.  Sometimes the cause of this condition is not known. What increases the risk? This condition is more likely to develop in:  People who have jobs that cause them to repeatedly move their wrists in the same motion, such as Art gallery manager.  Women.  People with certain conditions, such as: ? Diabetes. ? Obesity. ? An underactive thyroid (hypothyroidism). ? Kidney failure.  What are the signs or symptoms? Symptoms of this condition include:  A  tingling feeling in your fingers, especially in your thumb, index, and middle fingers.  Tingling or numbness in your hand.  An aching feeling in your entire arm, especially when your wrist and elbow are bent for long periods of time.  Wrist pain that goes up your arm to your shoulder.  Pain that goes down into your palm or fingers.  A weak feeling in your hands. You may have trouble grabbing and holding items.  Your symptoms may feel worse during the night. How is this diagnosed? This condition is diagnosed with a medical history and physical exam. You may also have tests, including:  An electromyogram (EMG). This test measures electrical signals sent by your nerves into the muscles.  X-rays.  How is this treated? Treatment for this condition includes:  Lifestyle changes. It is important to stop doing or modify the activity that caused your condition.  Physical or occupational therapy.  Medicines for pain and inflammation. This may include medicine that is injected into your wrist.  A wrist splint.  Surgery.  Follow these instructions at home: If you have a splint:  Wear it as told by your health care provider. Remove it only as told by your health care provider.  Loosen the splint if your fingers become numb and tingle,  or if they turn cold and blue.  Keep the splint clean and dry. General instructions  Take over-the-counter and prescription medicines only as told by your health care provider.  Rest your wrist from any activity that may be causing your pain. If your condition is work related, talk to your employer about changes that can be made, such as getting a wrist pad to use while typing.  If directed, apply ice to the painful area: ? Put ice in a plastic bag. ? Place a towel between your skin and the bag. ? Leave the ice on for 20 minutes, 2-3 times per day.  Keep all follow-up visits as told by your health care provider. This is important.  Do any  exercises as told by your health care provider, physical therapist, or occupational therapist. Contact a health care provider if:  You have new symptoms.  Your pain is not controlled with medicines.  Your symptoms get worse. This information is not intended to replace advice given to you by your health care provider. Make sure you discuss any questions you have with your health care provider. Document Released: 07/18/2000 Document Revised: 11/29/2015 Document Reviewed: 12/06/2014 Elsevier Interactive Patient Education  [employer].

## 2017-11-19 NOTE — Progress Notes (Signed)
[facility]    Provider:  Dr Jaynee Eagles Referring Provider: Manon Hilding, MD Primary Care Physician:  Manon Hilding, MD  CC: Polyneuropathy  Interval history 11/19/2017:  Patient is here for a visit, new consult and new problem, hasn't been seen in over 3 years.  Reviewed referring physician notes, patient presented with depression and chronic hypothyroidism and GERD, hypertension, arthralgias myalgias alleviated by corticosteroids and NSAIDs she is on meloxicam every day, Celexa, tramadol.  She has a past medical history of hypertension, gout, hypothyroidism, polyneuropathy, rheumatoid arthritis with rheumatoid factor, mixed hyperlipidemia, major depressive disorder. She continues to have migraines. The numbness in her hands and feet are worse. She fell and hit her head above the left eye, she had a black eye. She did not go to the emergency room. She saw Dr. Midge Aver and examined the eye.    Migraines; Chronic. She has tried multiple medications such as verapamil, celexa, meloxicam, tramadol, flexeril, prednisone, propranolol. After she fell she had a little headache, migaines are going well, she really doesn't have migraines often. Still on the verapamil.  Neuropathy: Idiopathic possibly related to her rheumatoid arthritis. Her feet feel hot, the soles always feel hot. She doesn't know why she has fallen, she started falling backwards and couldn;t catch herslef, 2 falls in the last year. She feels her balance is poor. If she closes her eyes in the shower she feels imbalance. She has dizziness when she turns around quickly but that is not the cause of the fall. She has cut back on the wine, maybe 4 oz a few times a week, she quit smoking a little over a year ago. The burning bothers her more at night, during the day she is better some arthritc pains. She has numbness in the hands. 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  Both hands are symmetric, feet are symmetric. She has pain in one shoulder but no neck pain and no radiculopathy. She is active at home yardwork but no balancing exercises.   Hand numbness: wakes up with numbness in the hands and when she changes positions it goes away.    lab testing in the past for neuropathy included B12 and folate, methylmalonic acid, hemoglobin A1c, Lyme, lumbar puncture, Sjogren's, IFE and PE.   CMP unremarkable  HPI: Zani Kyllonen is a 82 y.o. female here as a follow up for headaches  Interval history: 08/16/2014. She is feeling better, no headaches. She is still on the Verapamil and no side effects. She stopped Methotrexate and she on prednisone prn, joint pain feels better.   Interval history 02/11/2014: She is feeling well. She has been on steroids for her RA.She stopped Methotrexate. No headaches. She is still on the verapamil. She takes tramadol prn. She has some chronic neuropathy that is mild.   Interval History 07/05/2014: Her headaches are improved. No real headaches since taking the prednisone and starting verapamil. She gets them possibly every once in a while, but mild. Her Verapamil pills changed colors but she is feeling the same, still helping. No side effects from the Verapamil. She is on methotrexate for Rheumatoid Arthritis and it is helping. She sees Dr. Amil Amen. Her joint pain was terrible. Her joint pains are better after a month of methotrexate. She still reports paresthesias in her feet.   Reviewed notes, labs and imaging from outside physicians, which showed: Labs in Dr Valora Piccolo office recently  showed CMP/CBC unremarkable, Hepatitis panel negative. CCP Ab negative. CRP 11, high.   Previous Appointment [id number]/3279:   82 year old female who is here for evaluation of headaches. This summer started having headaches daily. Wakes up in the morning and it starts hurting in the right frontal/parietal areas. Can't describe the pain, "just hurts". Tylenol  will make it feel better. Bright lights make it worse or loud noises make it worse too. Takes tylenol daily which usually takes care of it but may start up again later in the day. She covers up the left eye and she feels better. Ice pack didn't help. 3 weeks ago started having chills and low grade fever and aching. Has Joint pain. RF+, ANA negative, sed rate normal but primary care is sending patient to rheumatology for evaluation. Recently diagnosed with lyme disease and took antibiotics. No rash. No tick seen. But had a bite and was given doxycycline. Has not had imaging of the brain. Lyme test was negative but had taken antibitoics before the bloodwork. Has had several insect and tick bites. The joint pain is migrating. Feels like her face is assymmetric, one side of her face is more droopy. Has pain in the hands, hips, hands, fingers, knees but not all at the same time. No pain in the shoulders. +fatigue, is so tiredthat even holding onto the steering wheel makes her fatigued. 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 No vision changes, no pain in the temples, no jaw claudication. Has paresthesias in her feet, feet feel hot and she has to put a cool washcloth on them and sleep outside the covers. Headaches can be severe, 7/10.    Review of Systems: Patient complains of symptoms per HPI as well as the following symptoms: joint pain, fatigue, depression, memory loss, dizziness, numbness Pertinent negatives per HPI. All others negative.    Social History   Socioeconomic History  . Marital status: Divorced    Spouse name: Not on file  . Number of children: 0  . Years of education: Therapist, sports  . Highest education level: Not on file  Occupational History  . Not on file  Social Needs  . Financial resource strain: Not on file  . Food insecurity:    Worry: Not on file    Inability: Not on file  . Transportation needs:    Medical: Not on  file    Non-medical: Not on file  Tobacco Use  . Smoking status: Former Smoker    Packs/day: 0.50    Types: Cigarettes    Last attempt to quit: 10/2016    Years since quitting: 1.1  . Smokeless tobacco: Never Used  . Tobacco comment: Quit 16 days ago after smoking for off and on for years  Substance and Sexual Activity  . Alcohol use: Yes    Alcohol/week: 0.0 oz    Comment: 4x per week, 1/2 glass of wine  . Drug use: No  . Sexual activity: Not on file  Lifestyle  . Physical activity:    Days per week: Not on file    Minutes per session: Not on file  . Stress: Not on file  Relationships  . Social connections:    Talks on phone: Not on file    Gets together: Not on file    Attends religious service: Not on file    Active member of club or organization: Not on file  Attends meetings of clubs or organizations: Not on file    Relationship status: Not on file  . Intimate partner violence:    Fear of current or ex partner: Not on file    Emotionally abused: Not on file    Physically abused: Not on file    Forced sexual activity: Not on file  Other Topics Concern  . Not on file  Social History Narrative   Patient is divorced, no children.   Patient is a retired Equities trader.   She lives at home alone, she has a friend who stays with her some   Patient is right handed.   Patient drinks 2 cups coffee daily.    Family History  Problem Relation Age of Onset  . Alzheimer's disease Father   . Alzheimer's disease Sister   . Macular degeneration Sister   . Healthy Brother   . Arthritis Sister   . Arthritis Sister   . COPD Sister   . Heart disease Mother   . Seizures Sister   . Seizures Other        nephew  . Liver cancer Other   . Alcohol abuse Sister     Past Medical History:  Diagnosis Date  . Abdominal pain   . Allergic rhinitis   . Anxiety 10/03/2013  . Arthralgia 03/31/2014  . Bronchitis 09/09/2013  . Cellulitis   . Diverticulitis   . Diverticulosis     . Fatigue   . GERD (gastroesophageal reflux disease)   . Gout   . Headache   . Headache   . Hyperchloremia   . Hypercholesterolemia   . Hypertension   . Hypothyroidism   . Lumbar strain   . Major depressive disorder   . Migraine   . Osteoporosis   . Rheumatoid arthritis [facility])     Past Surgical History:  Procedure Laterality Date  . CARPAL TUNNEL RELEASE     25 years ago  . CHOLECYSTECTOMY     20 yrs ago  . COLONOSCOPY    . COLONOSCOPY N/A 10/25/2014   Procedure: COLONOSCOPY;  Surgeon: Rogene Houston, MD;  Location: [facility];  Service: Endoscopy;  Laterality: N/A;  830  . ESOPHAGOGASTRODUODENOSCOPY N/A 10/25/2014   Procedure: ESOPHAGOGASTRODUODENOSCOPY (EGD);  Surgeon: Rogene Houston, MD;  Location: [facility];  Service: Endoscopy;  Laterality: N/A;  . HEMORROIDECTOMY     2005  . POLYPECTOMY    . TOTAL ABDOMINAL HYSTERECTOMY     Age 78  . UPPER GASTROINTESTINAL ENDOSCOPY      Current Outpatient Medications  Medication Sig Dispense Refill  . citalopram (CELEXA) 20 MG tablet Take 20 mg by mouth daily.    Marland Kitchen estradiol (ESTRACE) 0.1 MG/GM vaginal cream Place 1 Applicatorful vaginally daily.    . fexofenadine (ALLEGRA) 180 MG tablet Take 180 mg by mouth as needed for allergies or rhinitis.    Marland Kitchen levothyroxine (SYNTHROID, LEVOTHROID) 100 MCG tablet Take 100 mcg by mouth daily before breakfast.    . meloxicam (MOBIC) 7.5 MG tablet Take 7.5 mg by mouth as needed for pain.    . Multiple Vitamins-Minerals (PRESERVISION/LUTEIN PO) Take by mouth daily.    Marland Kitchen OVER THE COUNTER MEDICATION Raw Vinegar (with Mother) Patient states that she takes 1 teaspoon three times daily ,before meals: for Gerd,digestive problems    . pimecrolimus (ELIDEL) 1 % cream Apply 1 application topically daily.    . traMADol (ULTRAM) 50 MG tablet Take 1 tablet (50 mg total) by mouth every 6 (  six) hours as needed. 45 tablet 5  . verapamil (CALAN-SR) 120 MG CR tablet Take 1 tablet (120 mg total) by mouth  at bedtime. 30 tablet 11  . Calcium Carb-Cholecalciferol (CALCIUM 500 +D PO) Take 2 tablets by mouth daily.    Marland Kitchen FLUTICASONE PROPIONATE, NASAL, NA Place 50 mcg into the nose as needed.     No current facility-administered medications for this visit.     Allergies as of 11/19/2017 - Review Complete 11/19/2017  Allergen Reaction Noted  . Ibuprofen Other (See Comments) 10/16/2014    Vitals: BP 139/69 (BP Location: Right Arm, Patient Position: Sitting)   Pulse 63   Ht 5\' 6"  (1.676 m)   Wt 151 lb (68.5 kg)   BMI 24.37 kg/m  Last Weight:  Wt Readings from Last 1 Encounters:  11/19/17 151 lb (68.5 kg)   Last Height:   Ht Readings from Last 1 Encounters:  11/19/17 5\' 6"  (1.676 m)    Physical exam: Exam: Gen: NAD, conversant, well nourised, well groomed                     CV: RRR, no MRG. No Carotid Bruits. No peripheral edema, warm, nontender Eyes: Conjunctivae clear without exudates or hemorrhage  Neuro: Detailed Neurologic Exam  Speech:    Speech is normal; fluent and spontaneous with normal comprehension.  Cognition:    The patient is oriented to person, place, and time;     recent and remote memory intact;     language fluent;     normal attention, concentration,     fund of knowledge Cranial Nerves:    The pupils are equal, round, and reactive to light. The fundi are normal and spontaneous venous pulsations are present. Visual fields are full to finger confrontation. Extraocular movements are intact. Trigeminal sensation is intact and the muscles of mastication are normal. The face is symmetric. The palate elevates in the midline. Hearing intact. Voice is normal. Shoulder shrug is normal. The tongue has normal motion without fasciculations.   Coordination:    Normal finger to nose and heel to shin. Normal rapid alternating movements.   Gait:    Mildly wide based  Motor Observation:    No asymmetry, no atrophy, and no involuntary movements noted. Tone:    Normal  muscle tone.    Posture:    Posture is normal. normal erect    Strength:    Strength is V/V in the upper and lower limbs.      Sensation: she has patchy numbness in the lower extremities that doesn't correspond to a dermatome or to a distal distribution. Romberg with sway.     Reflex Exam:  DTR's:    Deep tendon reflexes in the upper and lower extremities are normal bilaterally.   Toes:    The toes are downgoing bilaterally.   Clonus:    Clonus is absent.  Assessment/Plan: 82 year old female here for polyneuropathy. Hx of migraines.   - Continue Verapamil for migraines  - Emg/ncs of both arms and one leg to eval for CTS and progression of neuropathy in the feet.   - Physical therapy for balance and gait, falls, Imbalance and falls needs physical therapy and balance exercises and exercises to increase endurance and strenth.  Cc: Dr. Quintin Alto  Orders Placed This Encounter  Procedures  . Ambulatory referral to Physical Therapy  . NCV with EMG(electromyography)     [name], MD  [facility] [address]  [place], [place] [address]  Phone [phone] Fax [phone]

## 2017-11-26 ENCOUNTER — Telehealth: Payer: Self-pay | Admitting: *Deleted

## 2017-11-26 NOTE — Telephone Encounter (Signed)
Pt left some papers (cholesterol results & visit info from Dr. Quintin Alto) at last office visit. Called pt & LVM asking for call back to let us know if she would like the papers mailed to her home. Did not leave any details in the message regarding what was on the papers. Asked pt to confirm her address if she does want them mailed.

## 2017-12-03 ENCOUNTER — Encounter (HOSPITAL_COMMUNITY): Payer: Self-pay

## 2017-12-03 ENCOUNTER — Ambulatory Visit (HOSPITAL_COMMUNITY): Payer: Medicare Other | Attending: Neurology

## 2017-12-03 ENCOUNTER — Other Ambulatory Visit: Payer: Self-pay

## 2017-12-03 DIAGNOSIS — R2689 Other abnormalities of gait and mobility: Secondary | ICD-10-CM

## 2017-12-03 DIAGNOSIS — M6281 Muscle weakness (generalized): Secondary | ICD-10-CM | POA: Diagnosis not present

## 2017-12-03 DIAGNOSIS — Z9181 History of falling: Secondary | ICD-10-CM

## 2017-12-04 NOTE — Therapy (Signed)
Megan Daniel, [place], [address] Phone: [phone]   Fax:  [phone]  Physical Therapy Evaluation  Patient Details  Name: Megan Daniel MRN: [id number] Date of Birth: 1935-10-03 Referring Provider: Melvenia Beam, MD   Encounter Date: 12/03/2017     12/03/17 1007  PT Visits / Re-Eval  Visit Number 1  Number of Visits 13  Date for PT Re-Evaluation 01/14/18 (mini-reassess on 12/24/17)  Authorization  Authorization Type Medicare Part A and B (Blue cross blue shield secondary); no auth required, visits based on medical guidelines - MCR  Authorization Time Period 12/03/17 - 01/14/18  Authorization - Visit Number 1  Authorization - Number of Visits 10  PT Time Calculation  PT Start Time 1003 (patient arrived late to eval)  PT Stop Time 1038  PT Time Calculation (min) 35 min  PT - End of Session  Activity Tolerance Patient tolerated treatment well  Behavior During Therapy [facility] for tasks assessed/performed    Past Medical History:  Diagnosis Date  . Abdominal pain   . Allergic rhinitis   . Anxiety 10/03/2013  . Arthralgia 03/31/2014  . Bronchitis 09/09/2013  . Cellulitis   . Diverticulitis   . Diverticulosis   . Fatigue   . GERD (gastroesophageal reflux disease)   . Gout   . Headache   . Headache   . Hyperchloremia   . Hypercholesterolemia   . Hypertension   . Hypothyroidism   . Lumbar strain   . Major depressive disorder   . Migraine   . Osteoporosis   . Rheumatoid arthritis [facility])     Past Surgical History:  Procedure Laterality Date  . CARPAL TUNNEL RELEASE     25 years ago  . CHOLECYSTECTOMY     20 yrs ago  . COLONOSCOPY    . COLONOSCOPY N/A 10/25/2014   Procedure: COLONOSCOPY;  Surgeon: Rogene Houston, MD;  [facility];  Service: Endoscopy;  Laterality: N/A;  830  . ESOPHAGOGASTRODUODENOSCOPY N/A 10/25/2014   Procedure: ESOPHAGOGASTRODUODENOSCOPY (EGD);  Surgeon: Rogene Houston, MD;  [facility];  Service: Endoscopy;  Laterality: N/A;  . HEMORROIDECTOMY     2005  . POLYPECTOMY    . TOTAL ABDOMINAL HYSTERECTOMY     Age 59  . UPPER GASTROINTESTINAL ENDOSCOPY      There were no vitals filed for this visit.    12/03/17 1004  Symptoms/Limitations  Subjective Patient arrives to therapy and reports her balance is alright but she has had 3 falls in the last year. She reports her 2 of those falls occurred because she could not feel obstacles around her when she was walking around her home. She denies fractures with her falls and states she had just had bruises and the worst was when she got a black eye from falling and hitting her head. She states sometimes her puppy (now 31 months old) plays with things and moves cords around the house and leaves toys scattered. She also reports when she turns quickly she feels dizzy at times but denies dizziness when she gets up from bed or when moving from transitional movements. She also reports she gets dizzy when closing her eyes in the shower but denies falls in the shower or bathroom. She states in the fall she was walking ~ 1 mile a day and was able to hike and the local trails but is worried about returning to that since having her last 2 falls and  not walking during the winter months. In the past she has used a SPC at time for her arthritis and knee pain but has not used an AD for more than a year. She is scheduled for a NVC later this month and has her follow up with her MD at the end of the month. She is hopping to return to walking and get involved with a yoga program as she had performed yoga at home but not yet participated in a class. She reports her RA sometimes causes her pain and increases her fatigue if she has to walk or stand for long periods of time.  Limitations Standing;Walking;House hold activities  How long can you sit comfortably? unlimited  How long can you stand comfortably? unlimited  How long can  you walk comfortably? not limited  Patient Stated Goals want to get back to exercises, walking/hiking, and doing yoga  Pain Assessment  Currently in Pain? No/denies         12/03/17 0001  Assessment  Medical Diagnosis Balance Impairment secondary to polyneuropathy  Referring Provider Melvenia Beam, MD  Next MD Visit 12/31/17 (NCV test and EMG test)  Prior Therapy no  Precautions  Precautions None  Restrictions  Weight Bearing Restrictions No  Balance Screen  Has the patient fallen in the past 6 months Yes  How many times? 2 (1x in the snow; 1x tripped over a cord she couldn't feel)  Has the patient had a decrease in activity level because of a fear of falling?  Yes  Is the patient reluctant to leave their home because of a fear of falling?  No  Home Environment  Living Environment Private residence  Living Arrangements Alone  Available Help at Discharge Friend(s)  Type of [place] to enter  Entrance Stairs-Number of Steps 2  Entrance Stairs-Rails None  Home Layout Two level  Alternate Level Stairs-Number of Steps 12  Alternate Level Stairs-Rails Right  Home Equipment [place] - single point  Additional Comments Patient has her office upstairs. She lives alone but has a friend who stays with her occasionally and can help her with fixing things around her home.  Prior Function  Level of Independence Independent;Independent with basic ADLs  Cognition  Overall Cognitive Status Within Functional Limits for tasks assessed  Observation/Other Assessments  Focus on Therapeutic Outcomes (FOTO)  26% limited  Sensation  Additional Comments Patient reports polyneuropathy that limits her sensation in Bil LE below her knees, she reports her feet feel hot  Posture/Postural Control  Posture/Postural Control No significant limitations  ROM / Strength  AROM / PROM / Strength Strength  Strength  Strength Assessment Site Hip;Knee;Ankle  Right/Left Knee Right;Left   Right Hip Flexion 4/5  Right Hip Extension 4-/5  Right Hip ABduction 4-/5  Left Hip Flexion 4/5  Left Hip Extension 4-/5  Left Hip ABduction 4-/5  Right Knee Flexion 4/5  Right Knee Extension 4+/5  Left Knee Flexion 4/5  Left Knee Extension 4+/5  Right Ankle Dorsiflexion 4/5  Left Ankle Dorsiflexion 4/5  Ambulation/Gait  Ambulation/Gait Yes  Ambulation/Gait Assistance 7: Independent  Ambulation Distance (Feet) 670 Feet (3MWT)  Assistive device None  Gait Pattern WFL  Ambulation Surface Level  Gait velocity 1.12 m/s  Balance  Balance Assessed Yes  Standardized Balance Assessment  Standardized Balance Assessment Dynamic Gait Index  Dynamic Gait Index  Level Surface 3  Change in Gait Speed 2  Gait with Horizontal Head Turns 1  Gait with Vertical  Head Turns 1  Gait and Pivot Turn 2  Step Over Obstacle 2  Step Around Obstacles 2  Steps 1 (alternating going up, single step going down)  Total Score 14  DGI comment: 14/24 = she is at risk for falls during gait activities  High Level Balance  High Level Balance Comments [place]: maintains eyes open for 30 seconds; maintain eyes closed for 20 seconds however has incresed postural sway    Objective measurements completed on examination: See above findings.     PT Education - 12/03/17 1530    Education provided  Yes    Education Details  Educated on exam findings and discussed appropriate POC.    Person(s) Educated  Patient    Methods  Explanation    Comprehension  Verbalized understanding       PT Short Term Goals - 12/03/17 1415      PT SHORT TERM GOAL #1   Title  Patient will be independent with HEP to progress functional strength and balance to progress towards goals and PLOF with reduced fall risk.    Time  2    Period  Weeks    Status  New    Target Date  12/17/17      PT SHORT TERM GOAL #2   Title  tient will improve bil LE strength for limited groups by 1/2 grade to improve muscle endurance with  activities and overall gait quality and performance with stair mobility.    Time  3    Period  Weeks    Status  New    Target Date  12/24/17      PT SHORT TERM GOAL #3   Title  Patient will improve DGI score by 3 points to show significant improvement in reduced fall risk while ambulating.    Time  3    Period  Weeks    Status  New        PT Long Term Goals - 12/03/17 1511      PT LONG TERM GOAL #1   Title  Patient will improve DGI score by 6 points to show further significant improvement in reduced fall risk and achieve a score of 20/24 indicating she is at decresed risk of falling while ambulating.    Time  6    Period  Weeks    Status  New    Target Date  01/14/18      PT LONG TERM GOAL #2   Title  Patient will improve bil LE strength for limited groups by 1 grade to improve muscle endurance with activities and overall gait quality and performance with stair mobility.    Time  6    Period  Weeks    Status  New      PT LONG TERM GOAL #3   Title  Patient will report being able to walk 1 mile or greater with no difficulty 2x/week or more to return to PLOF and ejoyable exercise activities to improve health and wellness.    Time  6    Period  Weeks    Status  New         Plan - 12/03/17 1518    Clinical Impression Statement  Ms. Fishel presents to initial outpatient PT evaluation for her history of falling and balance difficulties. Objective testing reveals muscle weakness, decreased endurance, impaired balance, decreased activity tolerance, difficulty walking and with functional mobility, and impaired sensation. She will benefit from skilled PT interventions to address her deficits  and progress towards goals to reduce her fall risk and return to PLOF.    Clinical Presentation  Stable    Clinical Presentation due to:  3MWT, DGI, MMT, FOTO, Clinical judgement    Clinical Decision Making  Low    Rehab Potential  Good    Clinical Impairments Affecting Rehab Potential  (+)  motivated, (+) PLOF    PT Frequency  2x / week    PT Duration  6 weeks    PT Treatment/Interventions  ADLs/Self Care Home Management;Aquatic Therapy;DME Instruction;Gait training;Stair training;Functional mobility training;Therapeutic activities;Therapeutic exercise;Balance training;Neuromuscular re-education;Patient/family education;Manual techniques;Passive range of motion;Vestibular    PT Next Visit Plan  Review initial evaluation and goals. Initiate HEP with tandem and SLS at counter and sit to stands. Begin balance training on dynamic surfaces and with SLS focus, and hip strengthening.    Consulted and Agree with Plan of Care  Patient       Patient will benefit from skilled therapeutic intervention in order to improve the following deficits and impairments:  Decreased balance, Decreased endurance, Decreased activity tolerance, Decreased strength, Dizziness, Impaired sensation, Difficulty walking  Visit Diagnosis: Other abnormalities of gait and mobility  Muscle weakness (generalized)  History of falling     Problem List Patient Active Problem List   Diagnosis Date Noted  . CTS (carpal tunnel syndrome) 11/19/2017  . Idiopathic polyneuropathy 11/19/2017  . Varicose veins of left lower extremity with complications 14/97/0263  . Headache(784.0) 04/08/2014  . Joint pain 04/08/2014  . Other malaise and fatigue 04/08/2014  . Lyme disease 04/08/2014  . Neuropathy 04/08/2014  . Achalasia 04/03/2014  . Personal history of colonic polyps 04/03/2014  . GERD (gastroesophageal reflux disease) 04/03/2014    Kipp Brood, PT, DPT Physical Therapist with [facility]  12/03/2017 6:26 PM    [place], [place], [address] Phone: [phone]   Fax:  [phone]  Name: Megan Daniel MRN: [id number] Date of Birth: [birth year]

## 2017-12-07 ENCOUNTER — Encounter (HOSPITAL_COMMUNITY): Payer: Self-pay

## 2017-12-07 ENCOUNTER — Telehealth (HOSPITAL_COMMUNITY): Payer: Self-pay

## 2017-12-07 ENCOUNTER — Other Ambulatory Visit: Payer: Self-pay

## 2017-12-07 ENCOUNTER — Ambulatory Visit (HOSPITAL_COMMUNITY): Payer: Medicare Other

## 2017-12-07 DIAGNOSIS — Z9181 History of falling: Secondary | ICD-10-CM

## 2017-12-07 DIAGNOSIS — M6281 Muscle weakness (generalized): Secondary | ICD-10-CM

## 2017-12-07 DIAGNOSIS — R2689 Other abnormalities of gait and mobility: Secondary | ICD-10-CM | POA: Diagnosis not present

## 2017-12-07 NOTE — Therapy (Signed)
[place], [place], [address] Phone: [phone]   Fax:  [phone]  Physical Therapy Treatment  Patient Details  Name: Megan Daniel MRN: [id number] Date of Birth: 05-15-1936 Referring Provider: Melvenia Beam, MD   Encounter Date: 12/07/2017  PT End of Session - 12/07/17 1139    Visit Number  2    Number of Visits  13    Date for PT Re-Evaluation  01/14/18 mini-reassess on 12/24/17    Authorization Type  Medicare Part A and B (Blue cross blue shield secondary); no auth required, visits based on medical guidelines - MCR    Authorization Time Period  12/03/17 - 01/14/18    Authorization - Visit Number  2    Authorization - Number of Visits  10    PT Start Time  1127    PT Stop Time  1206    PT Time Calculation (min)  39 min    Activity Tolerance  Patient tolerated treatment well    Behavior During Therapy  [facility] for tasks assessed/performed       Past Medical History:  Diagnosis Date  . Abdominal pain   . Allergic rhinitis   . Anxiety 10/03/2013  . Arthralgia 03/31/2014  . Bronchitis 09/09/2013  . Cellulitis   . Diverticulitis   . Diverticulosis   . Fatigue   . GERD (gastroesophageal reflux disease)   . Gout   . Headache   . Headache   . Hyperchloremia   . Hypercholesterolemia   . Hypertension   . Hypothyroidism   . Lumbar strain   . Major depressive disorder   . Migraine   . Osteoporosis   . Rheumatoid arthritis [facility])     Past Surgical History:  Procedure Laterality Date  . CARPAL TUNNEL RELEASE     25 years ago  . CHOLECYSTECTOMY     20 yrs ago  . COLONOSCOPY    . COLONOSCOPY N/A 10/25/2014   Procedure: COLONOSCOPY;  Surgeon: Rogene Houston, MD;  [facility];  Service: Endoscopy;  Laterality: N/A;  830  . ESOPHAGOGASTRODUODENOSCOPY N/A 10/25/2014   Procedure: ESOPHAGOGASTRODUODENOSCOPY (EGD);  Surgeon: Rogene Houston, MD;  [facility];  Service: Endoscopy;   Laterality: N/A;  . HEMORROIDECTOMY     2005  . POLYPECTOMY    . TOTAL ABDOMINAL HYSTERECTOMY     Age 61  . UPPER GASTROINTESTINAL ENDOSCOPY      There were no vitals filed for this visit.  Subjective Assessment - 12/07/17 1133    Subjective  Patient reports she had an alright weekend. She reports she went to church this weekend and enjoyed getting back. She denies falls. She reports she is walking her dog now and her dog just finished obedience school and now is well behaved on walks and does not pull as much on the leash.    Limitations  Standing;Walking;House hold activities    How long can you sit comfortably?  unlimited    How long can you stand comfortably?  unlimited    How long can you walk comfortably?  not limited    Patient Stated Goals  want to get back to exercises, walking/hiking, and doing yoga    Currently in Pain?  No/denies         [facility] Adult PT Treatment/Exercise - 12/07/17 0001      Exercises   Exercises  Knee/Hip      Knee/Hip Exercises: Standing   Heel  Raises  Both;1 set;20 reps;3 seconds    Heel Raises Limitations  1x 20 reps toe raises, 2-3 sec holds      Knee/Hip Exercises: Supine   Bridges  Strengthening;Both;2 sets;15 reps 3 sec holds      Knee/Hip Exercises: Sidelying   Hip ABduction  Strengthening;Both;2 sets;10 reps;Limitations    Hip ABduction Limitations  cues for hip extension with abduction to target glut med        Balance Exercises - 12/07/17 1137      Balance Exercises: Standing   Tandem Stance  Eyes open;4 reps 4 reps each foot (8 total alt. foot alignment)    SLS  Eyes open;Solid surface;Upper extremity support 2;5 reps;10 secs 5 reps Bil LE    SLS with Vectors  Solid surface;Other reps (comment);Upper extremity assist 2;Limitations 2HHA finger tip support, 10 reps, 2 cones    Rockerboard  Anterior/posterior;Lateral;EO;Intermittent UE support 2x 1 minute each ( 4 minutes total)    Tandem Gait  Forward;4 reps;Limitations minA to  prevent LOB, gait belt, 15'        PT Education - 12/07/17 1139    Education provided  Yes    Education Details  educated on exercises throughout and initiated HEP for hip strengthening and balance.    Person(s) Educated  Patient    Methods  Explanation;Handout    Comprehension  Verbalized understanding       PT Short Term Goals - 12/03/17 1415      PT SHORT TERM GOAL #1   Title  Patient will be independent with HEP to progress functional strength and balance to progress towards goals and PLOF with reduced fall risk.    Time  2    Period  Weeks    Status  New    Target Date  12/17/17      PT SHORT TERM GOAL #2   Title  tient will improve bil LE strength for limited groups by 1/2 grade to improve muscle endurance with activities and overall gait quality and performance with stair mobility.    Time  3    Period  Weeks    Status  New    Target Date  12/24/17      PT SHORT TERM GOAL #3   Title  Patient will improve DGI score by 3 points to show significant improvement in reduced fall risk while ambulating.    Time  3    Period  Weeks    Status  New        PT Long Term Goals - 12/03/17 1511      PT LONG TERM GOAL #1   Title  Patient will improve DGI score by 6 points to show further significant improvement in reduced fall risk and achieve a score of 20/24 indicating she is at decresed risk of falling while ambulating.    Time  6    Period  Weeks    Status  New    Target Date  01/14/18      PT LONG TERM GOAL #2   Title  Patient will improve bil LE strength for limited groups by 1 grade to improve muscle endurance with activities and overall gait quality and performance with stair mobility.    Time  6    Period  Weeks    Status  New      PT LONG TERM GOAL #3   Title  Patient will report being able to walk 1 mile or greater with no difficulty  2x/week or more to return to PLOF and ejoyable exercise activities to improve health and wellness.    Time  6    Period  Weeks     Status  New        Plan - 12/07/17 1200    Clinical Impression Statement  Start of session reviewed initial evaluation and goals with patient. Initiated hip strengthening and balance training this session. Patient has difficulty with tandem/SLS balance on solid surface and currently requires intermittent UE support throughout activitites. She requires verbal/tactile cues to encourage use of UE's for safety and was educated that as she improves we will increase the challenge of the exercises. I educated her not to perform the exercise performed in therapu unless they are added to her HEP for safety concerns. She initiated rockerboard training for balance and had greater difficulty with lateral balance compared to ant/post balance. She will continue to benefit from skilled PT services to address current impairments to improve mobility and reduce fall risk.    Rehab Potential  Good    Clinical Impairments Affecting Rehab Potential  (+) motivated, (+) PLOF    PT Frequency  2x / week    PT Duration  6 weeks    PT Treatment/Interventions  ADLs/Self Care Home Management;Aquatic Therapy;DME Instruction;Gait training;Stair training;Functional mobility training;Therapeutic activities;Therapeutic exercise;Balance training;Neuromuscular re-education;Patient/family education;Manual techniques;Passive range of motion;Vestibular    PT Next Visit Plan  Continue with tandem and SLS at counter and sit to stands. Continue with solid surface balance activities and reduce UE assist as able. Initiate side stepping on foam. Progress functional hip strengthening and add clamshell to HEP if patient demonstrates good form.    PT Home Exercise Plan  12/07/17 - tandem at counter, SLS at counter, bridge;     Consulted and Agree with Plan of Care  Patient       Patient will benefit from skilled therapeutic intervention in order to improve the following deficits and impairments:  Decreased balance, Decreased endurance,  Decreased activity tolerance, Decreased strength, Dizziness, Impaired sensation, Difficulty walking  Visit Diagnosis: Other abnormalities of gait and mobility  Muscle weakness (generalized)  History of falling     Problem List Patient Active Problem List   Diagnosis Date Noted  . CTS (carpal tunnel syndrome) 11/19/2017  . Idiopathic polyneuropathy 11/19/2017  . Varicose veins of left lower extremity with complications 38/18/2993  . Headache(784.0) 04/08/2014  . Joint pain 04/08/2014  . Other malaise and fatigue 04/08/2014  . Lyme disease 04/08/2014  . Neuropathy 04/08/2014  . Achalasia 04/03/2014  . Personal history of colonic polyps 04/03/2014  . GERD (gastroesophageal reflux disease) 04/03/2014    Kipp Brood, PT, DPT Physical Therapist with [facility]  12/07/2017 12:16 PM    [place] [address] [place], [place], [address] Phone: [phone]   Fax:  [phone]  Name: Megan Daniel MRN: [id number] Date of Birth: 25-Sep-1935

## 2017-12-07 NOTE — Patient Instructions (Signed)
STEP 1 STEP 2 STEP 3 Standing Single Leg Stance with Counter Support REPS: 5-10  SETS: 2-3  HOLD: 10-30 seconds  WEEKLY: 7x  DAILY: 1x Clinician Notes: Perform with both feet. Lift hands slightly above counter as able. Setup Begin in a standing upright position with your hands resting on a counter. Movement Lift one foot off the ground and maintain your balance in this position. Tip Make sure to maintain an upright posture and use the counter to help you balance as needed. STEP 1 STEP 2 Standing Tandem Balance with Counter Support REPS: 5-10  SETS: 2-3  HOLD: 10-30 seconds  WEEKLY: 7x  DAILY: 1x Clinician Notes: Perform alternating which foot is in front. Lift hands slightly above counter as able. Setup Begin in a standing upright position with your hands resting on a counter. Movement Place one foot directly behind the other, so that you are in a heel-to-toe position. Maintain your balance in this position. Tip Make sure to maintain an upright posture and use the counter to help you balance as needed. Prepared by Debara Pickett Access your exercises! [email or web address] Your Access Code: [email or web address] Disclaimer: This program provides exercises related to your condition that you can perform at home. As there is a risk of injury with any activity, use caution when performing exercises. If you experience any pain or discomfort, discontinue the exercises and contact your health care provider. Page 1 of 2 12/07/2017 STEP 1 STEP 2 Supine Bridge REPS: 10-15  SETS: 2-3  HOLD: 3 seconds  WEEKLY: 7x  DAILY: 1x Setup Begin lying on your back with your arms resting at your sides, your legs bent at the knees and your feet flat on the ground. Movement Tighten your abdominals and slowly lift your hips off the floor into a bridge position, keeping your back straight. Tip Make sure to keep your trunk stiff throughout the exercise and your arms flat on the  floor.

## 2017-12-07 NOTE — Telephone Encounter (Signed)
I called Ms. Naramore at her home number on file and offered her an 11:15 appointment as an earlier spot from her afternoon appointment. She stated she would like to take the earlier appointment and I said we would expect her at 11:15 AM.  Kipp Brood, PT, DPT Physical Therapist with [facility]  12/07/2017 10:49 AM

## 2017-12-09 ENCOUNTER — Ambulatory Visit (HOSPITAL_COMMUNITY): Payer: Medicare Other

## 2017-12-09 ENCOUNTER — Encounter (HOSPITAL_COMMUNITY): Payer: Self-pay

## 2017-12-09 ENCOUNTER — Other Ambulatory Visit: Payer: Self-pay

## 2017-12-09 DIAGNOSIS — M6281 Muscle weakness (generalized): Secondary | ICD-10-CM | POA: Diagnosis not present

## 2017-12-09 DIAGNOSIS — Z9181 History of falling: Secondary | ICD-10-CM | POA: Diagnosis not present

## 2017-12-09 DIAGNOSIS — R2689 Other abnormalities of gait and mobility: Secondary | ICD-10-CM

## 2017-12-09 NOTE — Therapy (Signed)
[place], [place], [address] Phone: [phone]   Fax:  [phone]  Physical Therapy Treatment  Patient Details  Name: Megan Daniel MRN: [id number] Date of Birth: [birth year] Referring Provider: Melvenia Beam, MD   Encounter Date: 12/09/2017  PT End of Session - 12/09/17 0918    Visit Number  3    Number of Visits  13    Date for PT Re-Evaluation  01/14/18 mini-reassess on 12/24/17    Authorization Type  Medicare Part A and B (Blue cross blue shield secondary); no auth required, visits based on medical guidelines - MCR    Authorization Time Period  12/03/17 - 01/14/18    Authorization - Visit Number  3    Authorization - Number of Visits  10    PT Start Time  0909 patient arrived late    PT Stop Time  0945    PT Time Calculation (min)  36 min    Activity Tolerance  Patient tolerated treatment well    Behavior During Therapy  [facility] for tasks assessed/performed       Past Medical History:  Diagnosis Date  . Abdominal pain   . Allergic rhinitis   . Anxiety 10/03/2013  . Arthralgia 03/31/2014  . Bronchitis 09/09/2013  . Cellulitis   . Diverticulitis   . Diverticulosis   . Fatigue   . GERD (gastroesophageal reflux disease)   . Gout   . Headache   . Headache   . Hyperchloremia   . Hypercholesterolemia   . Hypertension   . Hypothyroidism   . Lumbar strain   . Major depressive disorder   . Migraine   . Osteoporosis   . Rheumatoid arthritis [facility])     Past Surgical History:  Procedure Laterality Date  . CARPAL TUNNEL RELEASE     25 years ago  . CHOLECYSTECTOMY     20 yrs ago  . COLONOSCOPY    . COLONOSCOPY N/A 10/25/2014   Procedure: COLONOSCOPY;  Surgeon: Rogene Houston, MD;  [facility];  Service: Endoscopy;  Laterality: N/A;  830  . ESOPHAGOGASTRODUODENOSCOPY N/A 10/25/2014   Procedure: ESOPHAGOGASTRODUODENOSCOPY (EGD);  Surgeon: Rogene Houston, MD;  [facility];   Service: Endoscopy;  Laterality: N/A;  . HEMORROIDECTOMY     2005  . POLYPECTOMY    . TOTAL ABDOMINAL HYSTERECTOMY     Age 45  . UPPER GASTROINTESTINAL ENDOSCOPY      There were no vitals filed for this visit.  Subjective Assessment - 12/09/17 0913    Subjective  Patient reports she had a rough day yesterday taking her dog to the vet to be spayed. She had to pick her dog up at 4Pm whichis when [employer] meets so she picked her dog up early to be able to make it the her meeting. She states she had to take care of her dog this morning which is why she was late. She denies falls and reports she did her HEP yesterday with no difficulty.    Limitations  Standing;Walking;House hold activities    How long can you sit comfortably?  unlimited    How long can you stand comfortably?  unlimited    How long can you walk comfortably?  not limited    Patient Stated Goals  want to get back to exercises, walking/hiking, and doing yoga    Currently in Pain?  No/denies       [facility] Adult  PT Treatment/Exercise - 12/09/17 0001      Knee/Hip Exercises: Standing   Lateral Step Up  Both;1 set;15 reps;Hand Hold: 2;Step Height: 6"    Forward Step Up  Both;1 set;Step Height: 6";Hand Hold: 0;15 reps      Knee/Hip Exercises: Supine   Bridges  Strengthening;Both;2 sets;15 reps      Knee/Hip Exercises: Sidelying   Clams  2x 15 reps Bil LE with red TB       Balance Exercises - 12/09/17 0928      Balance Exercises: Standing   Tandem Stance  Eyes open;Foam/compliant surface;4 reps;20 secs modified tandem; alternating foot alignment    Rockerboard  Anterior/posterior;Lateral;EO;Intermittent UE support 2x 1 minute each ( 4 minutes total)    Partial Tandem Stance  Eyes open;2 reps;30 secs alternating foot alignment    Sidestepping  Foam/compliant support;2 reps;Limitations 2x 18' both directions        PT Education - 12/09/17 0917    Education provided  Yes    Education Details  Educated on exercises  throughout session and updated HEP with clamshell.    Person(s) Educated  Patient    Methods  Explanation;Handout    Comprehension  Verbalized understanding       PT Short Term Goals - 12/03/17 1415      PT SHORT TERM GOAL #1   Title  Patient will be independent with HEP to progress functional strength and balance to progress towards goals and PLOF with reduced fall risk.    Time  2    Period  Weeks    Status  New    Target Date  12/17/17      PT SHORT TERM GOAL #2   Title  tient will improve bil LE strength for limited groups by 1/2 grade to improve muscle endurance with activities and overall gait quality and performance with stair mobility.    Time  3    Period  Weeks    Status  New    Target Date  12/24/17      PT SHORT TERM GOAL #3   Title  Patient will improve DGI score by 3 points to show significant improvement in reduced fall risk while ambulating.    Time  3    Period  Weeks    Status  New        PT Long Term Goals - 12/03/17 1511      PT LONG TERM GOAL #1   Title  Patient will improve DGI score by 6 points to show further significant improvement in reduced fall risk and achieve a score of 20/24 indicating she is at decresed risk of falling while ambulating.    Time  6    Period  Weeks    Status  New    Target Date  01/14/18      PT LONG TERM GOAL #2   Title  Patient will improve bil LE strength for limited groups by 1 grade to improve muscle endurance with activities and overall gait quality and performance with stair mobility.    Time  6    Period  Weeks    Status  New      PT LONG TERM GOAL #3   Title  Patient will report being able to walk 1 mile or greater with no difficulty 2x/week or more to return to PLOF and ejoyable exercise activities to improve health and wellness.    Time  6    Period  Weeks  Status  New         Plan - 12/09/17 4599    Clinical Impression Statement  Therapy focused on bil hip strengthening and balance training today.  She continues to require verbal/tactile cues to improve weight shifting laterally on rockerboard to maintain midline balance. She progressed balance training to dynamic surfaces and hip strengthening with theraband for hip abduction. I educated her on updated HEP. She will continue to benefit from skilled PT services to address current impairments to improve mobility and reduce fall risk.     Rehab Potential  Good    Clinical Impairments Affecting Rehab Potential  (+) motivated, (+) PLOF    PT Frequency  2x / week    PT Duration  6 weeks    PT Treatment/Interventions  ADLs/Self Care Home Management;Aquatic Therapy;DME Instruction;Gait training;Stair training;Functional mobility training;Therapeutic activities;Therapeutic exercise;Balance training;Neuromuscular re-education;Patient/family education;Manual techniques;Passive range of motion;Vestibular    PT Next Visit Plan  Continue with tandem and SLS at counter and sit to stands. Continue to progress balance activities on dynamic surfaces and reduce UE assist as able. Progress functional hip strengthening with side stepping with TB.    PT Home Exercise Plan  12/07/17 - tandem at counter, SLS at counter, bridge;     Consulted and Agree with Plan of Care  Patient       Patient will benefit from skilled therapeutic intervention in order to improve the following deficits and impairments:  Decreased balance, Decreased endurance, Decreased activity tolerance, Decreased strength, Dizziness, Impaired sensation, Difficulty walking  Visit Diagnosis: Other abnormalities of gait and mobility  Muscle weakness (generalized)  History of falling     Problem List Patient Active Problem List   Diagnosis Date Noted  . CTS (carpal tunnel syndrome) 11/19/2017  . Idiopathic polyneuropathy 11/19/2017  . Varicose veins of left lower extremity with complications 77/41/4239  . Headache(784.0) 04/08/2014  . Joint pain 04/08/2014  . Other malaise and fatigue  04/08/2014  . Lyme disease 04/08/2014  . Neuropathy 04/08/2014  . Achalasia 04/03/2014  . Personal history of colonic polyps 04/03/2014  . GERD (gastroesophageal reflux disease) 04/03/2014    Kipp Brood, PT, DPT Physical Therapist with [facility]  12/09/2017 12:16 PM    [place], [place], [address] Phone: [phone]   Fax:  [phone]  Name: Megan Daniel MRN: [id number] Date of Birth: Apr 13, 1936

## 2017-12-11 DIAGNOSIS — W260XXA Contact with knife, initial encounter: Secondary | ICD-10-CM | POA: Diagnosis not present

## 2017-12-11 DIAGNOSIS — K219 Gastro-esophageal reflux disease without esophagitis: Secondary | ICD-10-CM | POA: Diagnosis not present

## 2017-12-11 DIAGNOSIS — S61012A Laceration without foreign body of left thumb without damage to nail, initial encounter: Secondary | ICD-10-CM | POA: Diagnosis not present

## 2017-12-11 DIAGNOSIS — Z79899 Other long term (current) drug therapy: Secondary | ICD-10-CM | POA: Diagnosis not present

## 2017-12-11 DIAGNOSIS — S61411A Laceration without foreign body of right hand, initial encounter: Secondary | ICD-10-CM | POA: Diagnosis not present

## 2017-12-14 ENCOUNTER — Other Ambulatory Visit: Payer: Self-pay

## 2017-12-14 ENCOUNTER — Encounter (HOSPITAL_COMMUNITY): Payer: Self-pay

## 2017-12-14 ENCOUNTER — Ambulatory Visit (HOSPITAL_COMMUNITY): Payer: Medicare Other

## 2017-12-14 DIAGNOSIS — R2689 Other abnormalities of gait and mobility: Secondary | ICD-10-CM

## 2017-12-14 DIAGNOSIS — Z6824 Body mass index (BMI) 24.0-24.9, adult: Secondary | ICD-10-CM | POA: Diagnosis not present

## 2017-12-14 DIAGNOSIS — M6281 Muscle weakness (generalized): Secondary | ICD-10-CM | POA: Diagnosis not present

## 2017-12-14 DIAGNOSIS — M7551 Bursitis of right shoulder: Secondary | ICD-10-CM | POA: Diagnosis not present

## 2017-12-14 DIAGNOSIS — Z9181 History of falling: Secondary | ICD-10-CM

## 2017-12-14 NOTE — Therapy (Signed)
[place] [address] [place], [place], [address] Phone: [phone]   Fax:  [phone]  Physical Therapy Treatment  Patient Details  Name: Megan Daniel MRN: [id number] Date of Birth: Feb 25, 1936 Referring Provider: Melvenia Beam, MD   Encounter Date: 12/14/2017  PT End of Session - 12/14/17 1424    Visit Number  4    Number of Visits  13    Date for PT Re-Evaluation  01/14/18 mini-reassess on 12/24/17    Authorization Type  Medicare Part A and B (Blue cross blue shield secondary); no auth required, visits based on medical guidelines - MCR    Authorization Time Period  12/03/17 - 01/14/18    Authorization - Visit Number  4    Authorization - Number of Visits  10    PT Start Time  3810    PT Stop Time  1512    PT Time Calculation (min)  44 min    Activity Tolerance  Patient tolerated treatment well    Behavior During Therapy  [facility] for tasks assessed/performed       Past Medical History:  Diagnosis Date  . Abdominal pain   . Allergic rhinitis   . Anxiety 10/03/2013  . Arthralgia 03/31/2014  . Bronchitis 09/09/2013  . Cellulitis   . Diverticulitis   . Diverticulosis   . Fatigue   . GERD (gastroesophageal reflux disease)   . Gout   . Headache   . Headache   . Hyperchloremia   . Hypercholesterolemia   . Hypertension   . Hypothyroidism   . Lumbar strain   . Major depressive disorder   . Migraine   . Osteoporosis   . Rheumatoid arthritis [facility])     Past Surgical History:  Procedure Laterality Date  . CARPAL TUNNEL RELEASE     25 years ago  . CHOLECYSTECTOMY     20 yrs ago  . COLONOSCOPY    . COLONOSCOPY N/A 10/25/2014   Procedure: COLONOSCOPY;  Surgeon: Rogene Houston, MD;  [facility];  Service: Endoscopy;  Laterality: N/A;  830  . ESOPHAGOGASTRODUODENOSCOPY N/A 10/25/2014   Procedure: ESOPHAGOGASTRODUODENOSCOPY (EGD);  Surgeon: Rogene Houston, MD;  [facility];  Service: Endoscopy;   Laterality: N/A;  . HEMORROIDECTOMY     2005  . POLYPECTOMY    . TOTAL ABDOMINAL HYSTERECTOMY     Age 29  . UPPER GASTROINTESTINAL ENDOSCOPY      There were no vitals filed for this visit.  Subjective Assessment - 12/14/17 1425    Subjective  Patient reports she had a good weekend and saw her daughter and family for Mothers Day. She states she went to her doctor this morning for Rt shoulder pain since she hasn't talekd to a doctor about it yet. She states it usually hurts when she stretches it backwards and soemtimes at night. He told her to take her mobic medicine for it and that it is likely bursitis.    Limitations  Standing;Walking;House hold activities    How long can you sit comfortably?  unlimited    How long can you stand comfortably?  unlimited    How long can you walk comfortably?  not limited    Patient Stated Goals  want to get back to exercises, walking/hiking, and doing yoga    Currently in Pain?  No/denies       [facility] Adult PT Treatment/Exercise - 12/14/17 0001      Knee/Hip Exercises: Standing  Lateral Step Up  Both;Step Height: 6";2 sets;10 reps;Hand Hold: 0    Forward Step Up  Both;Step Height: 6";Hand Hold: 0;2 sets;10 reps      Knee/Hip Exercises: Supine   Bridges  Strengthening;Both;15 reps;1 set 3 sec holds    Other Supine Knee/Hip Exercises  posterior pelvic tilt, 1x 20 reps, 3 seconds holds      Knee/Hip Exercises: Sidelying   Clams  2x 15 reps Bil LE with red TB      Balance Exercises - 12/14/17 1436      Balance Exercises: Standing   Tandem Stance  Eyes open;Foam/compliant surface;20 secs;3 reps 4 reps each foot (6 total alt. foot alignment)    Standing, One Foot on a Step  Eyes open;2 inch;15 secs;3 reps 3 reps Bil LE (6 total)    Rockerboard  Anterior/posterior;Lateral;EO;Intermittent UE support 2x 1 minute each ( 4 minutes total)    Tandem Gait  Forward;4 reps;Limitations minA to prevent LOB, gait belt, 15'    Sidestepping  Foam/compliant  support;2 reps;Limitations  2x 18' both directions        PT Education - 12/14/17 1534    Education provided  Yes    Education Details  Edcuated on exercises throughout session and updated HEP.    Person(s) Educated  Patient    Methods  Explanation;Handout    Comprehension  Verbalized understanding;Returned demonstration       PT Short Term Goals - 12/03/17 1415      PT SHORT TERM GOAL #1   Title  Patient will be independent with HEP to progress functional strength and balance to progress towards goals and PLOF with reduced fall risk.    Time  2    Period  Weeks    Status  New    Target Date  12/17/17      PT SHORT TERM GOAL #2   Title  tient will improve bil LE strength for limited groups by 1/2 grade to improve muscle endurance with activities and overall gait quality and performance with stair mobility.    Time  3    Period  Weeks    Status  New    Target Date  12/24/17      PT SHORT TERM GOAL #3   Title  Patient will improve DGI score by 3 points to show significant improvement in reduced fall risk while ambulating.    Time  3    Period  Weeks    Status  New        PT Long Term Goals - 12/03/17 1511      PT LONG TERM GOAL #1   Title  Patient will improve DGI score by 6 points to show further significant improvement in reduced fall risk and achieve a score of 20/24 indicating she is at decresed risk of falling while ambulating.    Time  6    Period  Weeks    Status  New    Target Date  01/14/18      PT LONG TERM GOAL #2   Title  Patient will improve bil LE strength for limited groups by 1 grade to improve muscle endurance with activities and overall gait quality and performance with stair mobility.    Time  6    Period  Weeks    Status  New      PT LONG TERM GOAL #3   Title  Patient will report being able to walk 1 mile or greater with no difficulty  2x/week or more to return to PLOF and ejoyable exercise activities to improve health and wellness.    Time  6     Period  Weeks    Status  New        Plan - 12/14/17 1424    Clinical Impression Statement  Patient is progressing well in therapy and continued to focus on bil hip strengthening and balance training. She required fewer cues for balance with rocker board today but required cues for safety with lateral step-ups. She had a LOB with her foot slipping on the step while doing lateral step ups and was instructed to use her hands at the // bars for support for remainder of exercise. She continued with balance training on dynamic surfaces and can progress next session to tandem gait on foam. She will continue to benefit from skilled PT services to address current impairments to improve mobility and reduce fall risk.    Rehab Potential  Good    Clinical Impairments Affecting Rehab Potential  (+) motivated, (+) PLOF    PT Frequency  2x / week    PT Duration  6 weeks    PT Treatment/Interventions  ADLs/Self Care Home Management;Aquatic Therapy;DME Instruction;Gait training;Stair training;Functional mobility training;Therapeutic activities;Therapeutic exercise;Balance training;Neuromuscular re-education;Patient/family education;Manual techniques;Passive range of motion;Vestibular    PT Next Visit Plan  Continue with tandem and SLS at counter and sit to stands. Continue to progress balance activities on dynamic surfaces and reduce UE assist as able. Progress functional hip strengthening with side stepping with TB and wall squats with hold for endurance next session.    PT Home Exercise Plan  12/07/17 - tandem at counter, SLS at counter, bridge; 12/14/17 - post pelvic tilt;     Consulted and Agree with Plan of Care  Patient       Patient will benefit from skilled therapeutic intervention in order to improve the following deficits and impairments:  Decreased balance, Decreased endurance, Decreased activity tolerance, Decreased strength, Dizziness, Impaired sensation, Difficulty walking  Visit Diagnosis: Other  abnormalities of gait and mobility  Muscle weakness (generalized)  History of falling     Problem List Patient Active Problem List   Diagnosis Date Noted  . CTS (carpal tunnel syndrome) 11/19/2017  . Idiopathic polyneuropathy 11/19/2017  . Varicose veins of left lower extremity with complications 46/50/3546  . Headache(784.0) 04/08/2014  . Joint pain 04/08/2014  . Other malaise and fatigue 04/08/2014  . Lyme disease 04/08/2014  . Neuropathy 04/08/2014  . Achalasia 04/03/2014  . Personal history of colonic polyps 04/03/2014  . GERD (gastroesophageal reflux disease) 04/03/2014    Kipp Brood, PT, DPT Physical Therapist with [facility]  12/14/2017 4:27 PM    [place], [place], [address] Phone: [phone]   Fax:  [phone]  Name: Megan Daniel MRN: [id number] Date of Birth: January 29, 1936

## 2017-12-16 ENCOUNTER — Ambulatory Visit (HOSPITAL_COMMUNITY): Payer: Medicare Other

## 2017-12-17 ENCOUNTER — Ambulatory Visit (HOSPITAL_COMMUNITY): Payer: Medicare Other

## 2017-12-17 ENCOUNTER — Encounter (HOSPITAL_COMMUNITY): Payer: Self-pay

## 2017-12-17 DIAGNOSIS — Z9181 History of falling: Secondary | ICD-10-CM

## 2017-12-17 DIAGNOSIS — M6281 Muscle weakness (generalized): Secondary | ICD-10-CM

## 2017-12-17 DIAGNOSIS — R2689 Other abnormalities of gait and mobility: Secondary | ICD-10-CM | POA: Diagnosis not present

## 2017-12-17 NOTE — Therapy (Signed)
[facility] [address] [place], [place], [address] Phone: [phone]   Fax:  [phone]  Physical Therapy Treatment  Patient Details  Name: Megan Daniel MRN: [id number] Date of Birth: 1935-10-14 Referring Provider: Melvenia Beam, MD   Encounter Date: 12/17/2017  PT End of Session - 12/17/17 1358    Visit Number  5    Number of Visits  13    Date for PT Re-Evaluation  01/14/18 mini-reassess 12/24/17    Authorization Type  Medicare Part A and B (Blue cross blue shield secondary); no auth required, visits based on medical guidelines - MCR    Authorization Time Period  12/03/17 - 01/14/18    Authorization - Visit Number  5    Authorization - Number of Visits  10    PT Start Time  3244    PT Stop Time  1435    PT Time Calculation (min)  43 min    Equipment Utilized During Treatment  Gait belt    Activity Tolerance  Patient tolerated treatment well    Behavior During Therapy  W[facility] for tasks assessed/performed       Past Medical History:  Diagnosis Date  . Abdominal pain   . Allergic rhinitis   . Anxiety 10/03/2013  . Arthralgia 03/31/2014  . Bronchitis 09/09/2013  . Cellulitis   . Diverticulitis   . Diverticulosis   . Fatigue   . GERD (gastroesophageal reflux disease)   . Gout   . Headache   . Headache   . Hyperchloremia   . Hypercholesterolemia   . Hypertension   . Hypothyroidism   . Lumbar strain   . Major depressive disorder   . Migraine   . Osteoporosis   . Rheumatoid arthritis [facility])     Past Surgical History:  Procedure Laterality Date  . CARPAL TUNNEL RELEASE     25 years ago  . CHOLECYSTECTOMY     20 yrs ago  . COLONOSCOPY    . COLONOSCOPY N/A 10/25/2014   Procedure: COLONOSCOPY;  Surgeon: Rogene Houston, MD;  [facility];  Service: Endoscopy;  Laterality: N/A;  830  . ESOPHAGOGASTRODUODENOSCOPY N/A 10/25/2014   Procedure: ESOPHAGOGASTRODUODENOSCOPY (EGD);  Surgeon: Rogene Houston, MD;   [facility];  Service: Endoscopy;  Laterality: N/A;  . HEMORROIDECTOMY     2005  . POLYPECTOMY    . TOTAL ABDOMINAL HYSTERECTOMY     Age 19  . UPPER GASTROINTESTINAL ENDOSCOPY      There were no vitals filed for this visit.  Subjective Assessment - 12/17/17 1358    Subjective  Pt stated she is feeling good today.  Had a hair apt in [place] earlier and rushed to make it to therapy today.  Pt reports she is feeling improvements with therapy so far    Patient Stated Goals  want to get back to exercises, walking/hiking, and doing yoga    Currently in Pain?  No/denies                       [facility] Adult PT Treatment/Exercise - 12/17/17 0001      Knee/Hip Exercises: Standing   Lateral Step Up  Both;15 reps;Hand Hold: 0;Step Height: 6"    Forward Step Up  Both;15 reps;Hand Hold: 0;Step Height: 6"    Wall Squat  10 reps;3 seconds      Knee/Hip Exercises: Seated   Sit to Sand  10 reps;without UE support  Balance Exercises - 12/17/17 1407      Balance Exercises: Standing   Tandem Stance  Eyes open;Foam/compliant surface;3 reps;30 secs    SLS  Eyes open;5 reps    Rockerboard  Anterior/posterior;Lateral;EO;Intermittent UE support 2 min each wiht minimal HHA    Tandem Gait  Forward;4 reps;Limitations    Sidestepping  Foam/compliant support;2 reps;Theraband GTB on balance beam          PT Short Term Goals - 12/03/17 1415      PT SHORT TERM GOAL #1   Title  Patient will be independent with HEP to progress functional strength and balance to progress towards goals and PLOF with reduced fall risk.    Time  2    Period  Weeks    Status  New    Target Date  12/17/17      PT SHORT TERM GOAL #2   Title  tient will improve bil LE strength for limited groups by 1/2 grade to improve muscle endurance with activities and overall gait quality and performance with stair mobility.    Time  3    Period  Weeks    Status  New    Target Date  12/24/17       PT SHORT TERM GOAL #3   Title  Patient will improve DGI score by 3 points to show significant improvement in reduced fall risk while ambulating.    Time  3    Period  Weeks    Status  New        PT Long Term Goals - 12/03/17 1511      PT LONG TERM GOAL #1   Title  Patient will improve DGI score by 6 points to show further significant improvement in reduced fall risk and achieve a score of 20/24 indicating she is at decresed risk of falling while ambulating.    Time  6    Period  Weeks    Status  New    Target Date  01/14/18      PT LONG TERM GOAL #2   Title  Patient will improve bil LE strength for limited groups by 1 grade to improve muscle endurance with activities and overall gait quality and performance with stair mobility.    Time  6    Period  Weeks    Status  New      PT LONG TERM GOAL #3   Title  Patient will report being able to walk 1 mile or greater with no difficulty 2x/week or more to return to PLOF and ejoyable exercise activities to improve health and wellness.    Time  6    Period  Weeks    Status  New            Plan - 12/17/17 1435    Clinical Impression Statement  Session focus with hip strengthening and balance training.  Added resistance and remained on dynamic surface with side step for hip strengthening with min guard, pt able to regain any LOB episode she had with activities.  Added wall squats for LE strengthening with good mechanics noted following cueing.  Pt able to complete all step up activities with no HHA and minimal unsteadiness.  No reports of pain through session, was limited by fatigue.    Rehab Potential  Good    Clinical Impairments Affecting Rehab Potential  (+) motivated, (+) PLOF    PT Frequency  2x / week    PT Duration  6 weeks  PT Treatment/Interventions  ADLs/Self Care Home Management;Aquatic Therapy;DME Instruction;Gait training;Stair training;Functional mobility training;Therapeutic activities;Therapeutic  exercise;Balance training;Neuromuscular re-education;Patient/family education;Manual techniques;Passive range of motion;Vestibular    PT Next Visit Plan  Continue with tandem and SLS at counter and sit to stands. Continue to progress balance activities on dynamic surfaces and reduce UE assist as able. Continue functional hip strengthening with side stepping with TB/dynamic surface and wall squats with hold for endurance next session.  Begin step downs for functional strengthenig next session and progress balance as able.      PT Home Exercise Plan  12/07/17 - tandem at counter, SLS at counter, bridge; 12/14/17 - post pelvic tilt;        Patient will benefit from skilled therapeutic intervention in order to improve the following deficits and impairments:  Decreased balance, Decreased endurance, Decreased activity tolerance, Decreased strength, Dizziness, Impaired sensation, Difficulty walking  Visit Diagnosis: Other abnormalities of gait and mobility  Muscle weakness (generalized)  History of falling     Problem List Patient Active Problem List   Diagnosis Date Noted  . CTS (carpal tunnel syndrome) 11/19/2017  . Idiopathic polyneuropathy 11/19/2017  . Varicose veins of left lower extremity with complications 96/43/8381  . Headache(784.0) 04/08/2014  . Joint pain 04/08/2014  . Other malaise and fatigue 04/08/2014  . Lyme disease 04/08/2014  . Neuropathy 04/08/2014  . Achalasia 04/03/2014  . Personal history of colonic polyps 04/03/2014  . GERD (gastroesophageal reflux disease) 04/03/2014   Ihor [place], [place]; [place]  Aldona Lento 12/17/2017, 2:44 PM  [place], [place], [address] Phone: [phone]   Fax:  [phone]  Name: Megan Daniel MRN: [id number] Date of Birth: 05-13-1936

## 2017-12-21 ENCOUNTER — Ambulatory Visit (HOSPITAL_COMMUNITY): Payer: Medicare Other

## 2017-12-23 ENCOUNTER — Ambulatory Visit (HOSPITAL_COMMUNITY): Payer: Medicare Other

## 2017-12-24 ENCOUNTER — Ambulatory Visit (HOSPITAL_COMMUNITY): Payer: Medicare Other

## 2017-12-24 ENCOUNTER — Other Ambulatory Visit: Payer: Self-pay

## 2017-12-24 ENCOUNTER — Encounter (HOSPITAL_COMMUNITY): Payer: Self-pay

## 2017-12-24 DIAGNOSIS — M6281 Muscle weakness (generalized): Secondary | ICD-10-CM | POA: Diagnosis not present

## 2017-12-24 DIAGNOSIS — R2689 Other abnormalities of gait and mobility: Secondary | ICD-10-CM

## 2017-12-24 DIAGNOSIS — Z9181 History of falling: Secondary | ICD-10-CM

## 2017-12-24 NOTE — Therapy (Signed)
[place], [place], [address] Phone: [phone]   Fax:  [phone]  Physical Therapy Treatment/Progress Note  Patient Details  Name: Megan Daniel MRN: [id number] Date of Birth: [birth year] Referring Provider: Melvenia Beam, MD   Encounter Date: 12/24/2017  Progress Note Reporting Period 12/03/17 to 12/24/17  See note below for Objective Data and Assessment of Progress/Goals.    PT End of Session - 12/24/17 1300    Visit Number  6    Number of Visits  13    Date for PT Re-Evaluation  01/14/18 mini-reassess 12/24/17    Authorization Type  Medicare Part A and B (Blue cross blue shield secondary); no auth required, visits based on medical guidelines - MCR    Authorization Time Period  12/03/17 - 01/14/18    Authorization - Visit Number  6    Authorization - Number of Visits  10    PT Start Time  1304    PT Stop Time  1343    PT Time Calculation (min)  39 min    Equipment Utilized During Treatment  Gait belt    Activity Tolerance  Patient tolerated treatment well    Behavior During Therapy  WFL for tasks assessed/performed       Past Medical History:  Diagnosis Date  . Abdominal pain   . Allergic rhinitis   . Anxiety 10/03/2013  . Arthralgia 03/31/2014  . Bronchitis 09/09/2013  . Cellulitis   . Diverticulitis   . Diverticulosis   . Fatigue   . GERD (gastroesophageal reflux disease)   . Gout   . Headache   . Headache   . Hyperchloremia   . Hypercholesterolemia   . Hypertension   . Hypothyroidism   . Lumbar strain   . Major depressive disorder   . Migraine   . Osteoporosis   . Rheumatoid arthritis [facility])     Past Surgical History:  Procedure Laterality Date  . CARPAL TUNNEL RELEASE     25 years ago  . CHOLECYSTECTOMY     20 yrs ago  . COLONOSCOPY    . COLONOSCOPY N/A 10/25/2014   Procedure: COLONOSCOPY;  Surgeon: Rogene Houston, MD;  [facility];  Service: Endoscopy;   Laterality: N/A;  830  . ESOPHAGOGASTRODUODENOSCOPY N/A 10/25/2014   Procedure: ESOPHAGOGASTRODUODENOSCOPY (EGD);  Surgeon: Rogene Houston, MD;  [facility];  Service: Endoscopy;  Laterality: N/A;  . HEMORROIDECTOMY     2005  . POLYPECTOMY    . TOTAL ABDOMINAL HYSTERECTOMY     Age 82  . UPPER GASTROINTESTINAL ENDOSCOPY      There were no vitals filed for this visit.  Subjective Assessment - 12/24/17 1302    Subjective  Patient reported feeling good today and states she was unable to come to her monday appointment because her sister fell and she was helpin her all day. She denies pain and states she went to [place] this weekend before and still did some of her exercisse while on vacation.    Limitations  Standing;Walking;House hold activities    How long can you sit comfortably?  unlimited    How long can you stand comfortably?  unlimited    How long can you walk comfortably?  not limited    Patient Stated Goals  want to get back to exercises, walking/hiking, and doing yoga    Currently in Pain?  No/denies        [facility] PT Assessment -  12/24/17 0001      Assessment   Medical Diagnosis  Balance Impairment secondary to polyneuropathy    Referring Provider  Melvenia Beam, MD    Next MD Visit  12/31/17    Prior Therapy  no      Precautions   Precautions  None      Restrictions   Weight Bearing Restrictions  No      Cognition   Overall Cognitive Status  Within Functional Limits for tasks assessed      Strength   Right Hip Flexion  4+/5 was 4    Right Hip Extension  4+/5 was 4-    Right Hip ABduction  4+/5 was 4-    Left Hip Flexion  4+/5 was 4    Left Hip Extension  4+/5 was 4-    Left Hip ABduction  4+/5 was 4-    Right Knee Flexion  4+/5 was 4    Right Knee Extension  5/5 was 4+    Left Knee Flexion  4+/5 was 4    Left Knee Extension  5/5 was 4+    Right Ankle Dorsiflexion  4+/5 was 4    Left Ankle Dorsiflexion  4+/5 was 4      Standardized Balance  Assessment   Standardized Balance Assessment  Dynamic Gait Index      Dynamic Gait Index   Level Surface  Normal    Change in Gait Speed  Normal    Gait with Horizontal Head Turns  Mild Impairment    Gait with Vertical Head Turns  Normal    Gait and Pivot Turn  Normal    Step Over Obstacle  Mild Impairment    Step Around Obstacles  Normal    Steps  Normal    Total Score  22        [facility] Adult PT Treatment/Exercise - 12/24/17 0001      Knee/Hip Exercises: Standing   Lateral Step Up  Both;15 reps;Hand Hold: 0;Step Height: 6" 4" step with 2" foam    Forward Step Up  Both;15 reps;Hand Hold: 0;Step Height: 6" 4" step with 2" foam    Step Down  Step Height: 4";Hand Hold: 1;Both;1 set;15 reps       Balance Exercises - 12/24/17 1331      Balance Exercises: Standing   Tandem Stance  Eyes open;Foam/compliant surface;30 secs;Intermittent upper extremity support 1lb dowel for Bil UE flexion, 10 reps alt foot alignment        PT Education - 12/24/17 1343    Education provided  Yes    Education Details  Educated on progress towards goals.     Person(s) Educated  Patient    Methods  Explanation    Comprehension  Verbalized understanding       PT Short Term Goals - 12/03/17 1415      PT SHORT TERM GOAL #1   Title  Patient will be independent with HEP to progress functional strength and balance to progress towards goals and PLOF with reduced fall risk.    Time  2    Period  Weeks    Status  New    Target Date  12/17/17      PT SHORT TERM GOAL #2   Title  tient will improve bil LE strength for limited groups by 1/2 grade to improve muscle endurance with activities and overall gait quality and performance with stair mobility.    Time  3    Period  Weeks    Status  New    Target Date  12/24/17      PT SHORT TERM GOAL #3   Title  Patient will improve DGI score by 3 points to show significant improvement in reduced fall risk while ambulating.    Time  3    Period  Weeks     Status  New        PT Long Term Goals - 12/03/17 1511      PT LONG TERM GOAL #1   Title  Patient will improve DGI score by 6 points to show further significant improvement in reduced fall risk and achieve a score of 20/24 indicating she is at decresed risk of falling while ambulating.    Time  6    Period  Weeks    Status  New    Target Date  01/14/18      PT LONG TERM GOAL #2   Title  Patient will improve bil LE strength for limited groups by 1 grade to improve muscle endurance with activities and overall gait quality and performance with stair mobility.    Time  6    Period  Weeks    Status  New      PT LONG TERM GOAL #3   Title  Patient will report being able to walk 1 mile or greater with no difficulty 2x/week or more to return to PLOF and ejoyable exercise activities to improve health and wellness.    Time  6    Period  Weeks    Status  New        Plan - 12/24/17 1347    Clinical Impression Statement  Re-assessment performed today and patient has met all short term goals and 1/3 long term goals. She has made significant improvement in all limited muscle groups with MMT for bil LE, and made an 8 point improvement in her DGI indicating a decreased risk of falling. Remainder of session focused on hip strengthening and balance training. She advanced functional step ups onto compliant surface and continued with tandem balance with UE flexion. No reports of pain through session, was limited by fatigue. She will continue to benefit from skilled PT services to address impairments and progress towards remaining goals.    Rehab Potential  Good    Clinical Impairments Affecting Rehab Potential  (+) motivated, (+) PLOF    PT Frequency  2x / week    PT Duration  6 weeks    PT Treatment/Interventions  ADLs/Self Care Home Management;Aquatic Therapy;DME Instruction;Gait training;Stair training;Functional mobility training;Therapeutic activities;Therapeutic exercise;Balance  training;Neuromuscular re-education;Patient/family education;Manual techniques;Passive range of motion;Vestibular    PT Next Visit Plan  Continue to progress balance activities on dynamic surfaces and reduce UE assist as able. Continue functional hip strengthening with side stepping with TB/dynamic surface and wall squats with hold for endurance next session.  Begin step downs for functional strengthening next session and progress balance as able. Initiate yoga balance poses/training.    PT Home Exercise Plan  12/07/17 - tandem at counter, SLS at counter, bridge; 12/14/17 - post pelvic tilt;     Consulted and Agree with Plan of Care  Patient       Patient will benefit from skilled therapeutic intervention in order to improve the following deficits and impairments:  Decreased balance, Decreased endurance, Decreased activity tolerance, Decreased strength, Dizziness, Impaired sensation, Difficulty walking  Visit Diagnosis: Other abnormalities of gait and mobility  Muscle weakness (generalized)  History of falling  Problem List Patient Active Problem List   Diagnosis Date Noted  . CTS (carpal tunnel syndrome) 11/19/2017  . Idiopathic polyneuropathy 11/19/2017  . Varicose veins of left lower extremity with complications 65/10/5463  . Headache(784.0) 04/08/2014  . Joint pain 04/08/2014  . Other malaise and fatigue 04/08/2014  . Lyme disease 04/08/2014  . Neuropathy 04/08/2014  . Achalasia 04/03/2014  . Personal history of colonic polyps 04/03/2014  . GERD (gastroesophageal reflux disease) 04/03/2014    Kipp Brood, PT, DPT Physical Therapist with [facility]  12/24/2017 1:47 PM    [place], [place], [address] Phone: [phone]   Fax:  [phone]  Name: Kylene Zamarron MRN: [id number] Date of Birth: 08-12-1935

## 2017-12-29 ENCOUNTER — Telehealth (HOSPITAL_COMMUNITY): Payer: Self-pay

## 2017-12-29 ENCOUNTER — Other Ambulatory Visit: Payer: Self-pay

## 2017-12-29 ENCOUNTER — Ambulatory Visit (HOSPITAL_COMMUNITY): Payer: Medicare Other

## 2017-12-29 ENCOUNTER — Encounter (HOSPITAL_COMMUNITY): Payer: Self-pay

## 2017-12-29 DIAGNOSIS — M6281 Muscle weakness (generalized): Secondary | ICD-10-CM

## 2017-12-29 DIAGNOSIS — Z9181 History of falling: Secondary | ICD-10-CM

## 2017-12-29 DIAGNOSIS — R2689 Other abnormalities of gait and mobility: Secondary | ICD-10-CM | POA: Diagnosis not present

## 2017-12-29 NOTE — Therapy (Signed)
[place] [address] [place], [place], [address] Phone: [phone]   Fax:  [phone]  Physical Therapy Treatment  Patient Details  Name: Chivon Lepage MRN: [id number] Date of Birth: [birth year] Referring Provider: Melvenia Beam, MD   Encounter Date: 12/29/2017  PT End of Session - 12/29/17 0915    Visit Number  7    Number of Visits  13    Date for PT Re-Evaluation  01/14/18 mini-reassess 12/24/17    Authorization Type  Medicare Part A and B (Blue cross blue shield secondary); no auth required, visits based on medical guidelines - MCR    Authorization Time Period  12/03/17 - 01/14/18    Authorization - Visit Number  2    Authorization - Number of Visits  10    PT Start Time  0911 patient arrives late    PT Stop Time  0944    PT Time Calculation (min)  33 min    Equipment Utilized During Treatment  Gait belt    Activity Tolerance  Patient tolerated treatment well    Behavior During Therapy  [facility] for tasks assessed/performed       Past Medical History:  Diagnosis Date  . Abdominal pain   . Allergic rhinitis   . Anxiety 10/03/2013  . Arthralgia 03/31/2014  . Bronchitis 09/09/2013  . Cellulitis   . Diverticulitis   . Diverticulosis   . Fatigue   . GERD (gastroesophageal reflux disease)   . Gout   . Headache   . Headache   . Hyperchloremia   . Hypercholesterolemia   . Hypertension   . Hypothyroidism   . Lumbar strain   . Major depressive disorder   . Migraine   . Osteoporosis   . Rheumatoid arthritis [facility])     Past Surgical History:  Procedure Laterality Date  . CARPAL TUNNEL RELEASE     25 years ago  . CHOLECYSTECTOMY     20 yrs ago  . COLONOSCOPY    . COLONOSCOPY N/A 10/25/2014   Procedure: COLONOSCOPY;  Surgeon: Rogene Houston, MD;  [facility];  Service: Endoscopy;  Laterality: N/A;  830  . ESOPHAGOGASTRODUODENOSCOPY N/A 10/25/2014   Procedure: ESOPHAGOGASTRODUODENOSCOPY (EGD);  Surgeon:  Rogene Houston, MD;  [facility];  Service: Endoscopy;  Laterality: N/A;  . HEMORROIDECTOMY     2005  . POLYPECTOMY    . TOTAL ABDOMINAL HYSTERECTOMY     Age 82  . UPPER GASTROINTESTINAL ENDOSCOPY      There were no vitals filed for this visit.  Subjective Assessment - 12/29/17 0912    Subjective  Patient reports she ahd a busy weekend and watched her 80 year old nephew for the enitre weekend. She reports she did a lot of chasing after her nephew and did a few of her exercises this weekend but not all of them. She reports he back and legs feels good but she is having right shoulder pain that seems worst when she is sleeping. Patient reports she has her NCV/EMG tset thursday morning and would liek to try to reschedule her PT visit for later in teh afternoon or for Friday this week.    Limitations  Standing;Walking;House hold activities    How long can you sit comfortably?  unlimited    How long can you stand comfortably?  unlimited    How long can you walk comfortably?  not limited    Patient Stated Goals  want to  get back to exercises, walking/hiking, and doing yoga    Currently in Pain?  Yes    Pain Score  1     Pain Location  Shoulder    Pain Orientation  Right    Pain Descriptors / Indicators  Aching;Sore    Pain Type  Chronic pain    Pain Onset  More than a month ago    Pain Frequency  Intermittent    Aggravating Factors   worse at night         [facility] Adult PT Treatment/Exercise - 12/29/17 0001      Balance Poses: Yoga   Warrior I  2 reps;Limitations;30 seconds    Warrior I Limitations  bil LE      Knee/Hip Exercises: Standing   Lateral Step Up  Both;15 reps;Hand Hold: 0;Step Height: 6" 4" step with 2" foam    Forward Step Up  Both;15 reps;Hand Hold: 0;Step Height: 6" 4" step with 2" foam    Wall Squat  Limitations;1 set    Wall Squat Limitations  15 reps, 5 second holds    Other Standing Knee Exercises  side stepping with red TB 3 RTx 15'       Balance  Exercises - 12/29/17 0919      Balance Exercises: Standing   SLS with Vectors  Solid surface;Other reps (comment);Upper extremity assist 2;Limitations 1HHA finger tip support, 10 reps, 3 cones    Rockerboard  Anterior/posterior;Lateral;EO;Intermittent UE support  2x 1 minute each ( 4 minutes total)        PT Education - 12/29/17 0945    Education provided  Yes    Education Details  Educated on importance of arriving on time to therapy session in order to get full treatment    Person(s) Educated  Patient    Methods  Explanation    Comprehension  Verbalized understanding       PT Short Term Goals - 12/03/17 1415      PT SHORT TERM GOAL #1   Title  Patient will be independent with HEP to progress functional strength and balance to progress towards goals and PLOF with reduced fall risk.    Time  2    Period  Weeks    Status  New    Target Date  12/17/17      PT SHORT TERM GOAL #2   Title  tient will improve bil LE strength for limited groups by 1/2 grade to improve muscle endurance with activities and overall gait quality and performance with stair mobility.    Time  3    Period  Weeks    Status  New    Target Date  12/24/17      PT SHORT TERM GOAL #3   Title  Patient will improve DGI score by 3 points to show significant improvement in reduced fall risk while ambulating.    Time  3    Period  Weeks    Status  New        PT Long Term Goals - 12/03/17 1511      PT LONG TERM GOAL #1   Title  Patient will improve DGI score by 6 points to show further significant improvement in reduced fall risk and achieve a score of 20/24 indicating she is at decresed risk of falling while ambulating.    Time  6    Period  Weeks    Status  New    Target Date  01/14/18  PT LONG TERM GOAL #2   Title  Patient will improve bil LE strength for limited groups by 1 grade to improve muscle endurance with activities and overall gait quality and performance with stair mobility.    Time  6     Period  Weeks    Status  New      PT LONG TERM GOAL #3   Title  Patient will report being able to walk 1 mile or greater with no difficulty 2x/week or more to return to PLOF and ejoyable exercise activities to improve health and wellness.    Time  6    Period  Weeks    Status  New        Plan - 12/29/17 1751    Clinical Impression Statement  Patient arrived 10 minutes late to therapy session and was educated on importance of arriving on time to have benefit of full session. Therapy continued to focus on functional hip strengthening/endurance exercises, and balance training. Added warrior pose 1 for balance training and introduction of yoga exercises to progress towards long term goal and continued with rockerboard which patient requires intermittent UE support to prevent LOB. She will continue to benefit from skilled PT interventions to address deficits and improve balance to progress towards goals.    Rehab Potential  Good    Clinical Impairments Affecting Rehab Potential  (+) motivated, (+) PLOF    PT Frequency  2x / week    PT Duration  6 weeks    PT Treatment/Interventions  ADLs/Self Care Home Management;Aquatic Therapy;DME Instruction;Gait training;Stair training;Functional mobility training;Therapeutic activities;Therapeutic exercise;Balance training;Neuromuscular re-education;Patient/family education;Manual techniques;Passive range of motion;Vestibular    PT Next Visit Plan  Continue to progress balance activities on dynamic surfaces and reduce UE assist as able. Continue with side stepping with TB/dynamic surface and wall squats with hold for endurance next session.  Continue step downs for functional strengthening next session and progress balance as able. Continue  yoga balance poses/training. Add side step with band to HEP and warrior pose 1.    PT Home Exercise Plan  12/07/17 - tandem at counter, SLS at counter, bridge; 12/14/17 - post pelvic tilt;     Consulted and Agree with Plan  of Care  Patient       Patient will benefit from skilled therapeutic intervention in order to improve the following deficits and impairments:  Decreased balance, Decreased endurance, Decreased activity tolerance, Decreased strength, Dizziness, Impaired sensation, Difficulty walking  Visit Diagnosis: Other abnormalities of gait and mobility  Muscle weakness (generalized)  History of falling     Problem List Patient Active Problem List   Diagnosis Date Noted  . CTS (carpal tunnel syndrome) 11/19/2017  . Idiopathic polyneuropathy 11/19/2017  . Varicose veins of left lower extremity with complications 02/58/5277  . Headache(784.0) 04/08/2014  . Joint pain 04/08/2014  . Other malaise and fatigue 04/08/2014  . Lyme disease 04/08/2014  . Neuropathy 04/08/2014  . Achalasia 04/03/2014  . Personal history of colonic polyps 04/03/2014  . GERD (gastroesophageal reflux disease) 04/03/2014    Kipp Brood, PT, DPT Physical Therapist with [facility]  12/29/2017 11:01 AM    [place], [place], [address] Phone: [phone]   Fax:  [phone]  Name: Rebekka Lobello MRN: [id number] Date of Birth: [birth year]

## 2017-12-29 NOTE — Telephone Encounter (Signed)
She has another MD apptment on this date; she will come in next [place] and scheduled.

## 2017-12-30 DIAGNOSIS — M7551 Bursitis of right shoulder: Secondary | ICD-10-CM | POA: Diagnosis not present

## 2017-12-31 ENCOUNTER — Ambulatory Visit (INDEPENDENT_AMBULATORY_CARE_PROVIDER_SITE_OTHER): Payer: Medicare Other | Admitting: Neurology

## 2017-12-31 ENCOUNTER — Ambulatory Visit (HOSPITAL_COMMUNITY): Payer: Medicare Other

## 2017-12-31 DIAGNOSIS — Z0289 Encounter for other administrative examinations: Secondary | ICD-10-CM

## 2017-12-31 DIAGNOSIS — G629 Polyneuropathy, unspecified: Secondary | ICD-10-CM | POA: Diagnosis not present

## 2017-12-31 DIAGNOSIS — G609 Hereditary and idiopathic neuropathy, unspecified: Secondary | ICD-10-CM | POA: Diagnosis not present

## 2017-12-31 DIAGNOSIS — G5602 Carpal tunnel syndrome, left upper limb: Secondary | ICD-10-CM

## 2017-12-31 NOTE — Progress Notes (Signed)
See procedure note.

## 2017-12-31 NOTE — Procedures (Signed)
Full Name: Megan Daniel Gender: Female MRN #: [id number] Date of Birth: June 28, 2036    Visit Date: 12/31/17 08:10 Age: 82 Years 0 Months Old Examining Physician: [name], MD     History: - Emg/ncs of both arms and one leg to eval for CTS and progression of neuropathy in the feet.   Summary: EMG/NCS was performed on the bilateral upper extremities and right lower extremities  The left  median APB motor nerve showed prolonged distal onset latency (5.3 ms, N<4.4)   The left  Median 2nd Digit orthodromic sensory nerve showed prolonged distal peak latency (5.0 ms, N<3.4) and reduced amplitude(1 uV, N>10)  The left median/ulnar (palm) comparison nerve showed prolonged distal peak latency (Median Palm, 5.0 ms, N<2.2) and abnormal peak latency difference (Median Palm-Ulnar Palm, 1.8 ms, N<0.4) with a relative median delay.    All remaining nerves (as detailed in the following tables) were within normal limits All muscles (as detailed in the following tables) were within normal limits  Conclusion: There is left moderately-severe Carpal Tunnel Syndrome. No progression of distal polyneuropathy in the lower extremities.  [name] M.D.  [facility] [place], [place] [address] Tel: [phone] Fax: [phone]        [facility]    Nerve / Sites Muscle Latency Ref. Amplitude Ref. Rel Amp Segments Distance Velocity Ref. Area    ms ms mV mV %  cm m/s m/s mVms  R Median - APB     Wrist APB 3.7 ?4.4 4.4 ?4.0 100 Wrist - APB 7   14.5     Upper arm APB 8.4  4.0  90.8 Upper arm - Wrist 24 51 ?49 12.7  L Median - APB     Wrist APB 5.3 ?4.4 6.8 ?4.0 100 Wrist - APB 7   23.9     Upper arm APB 10.1  6.3  92.5 Upper arm - Wrist 24 50 ?49 21.9  R Ulnar - ADM     Wrist ADM 2.7 ?3.3 6.6 ?6.0 100 Wrist - ADM 7   22.1     B.Elbow ADM 6.8  6.7  103 B.Elbow - Wrist 21 52 ?49 23.1     A.Elbow ADM 9.1  6.3  94.2 A.Elbow - B.Elbow 12 51 ?49 22.7         A.Elbow -  Wrist      L Ulnar - ADM     Wrist ADM 2.8 ?3.3 6.8 ?6.0 100 Wrist - ADM 7   23.9     B.Elbow ADM 6.9  6.5  95.8 B.Elbow - Wrist 21 51 ?49 22.7     A.Elbow ADM 8.9  5.9  90.1 A.Elbow - B.Elbow 10 51 ?49 22.7         A.Elbow - Wrist      R Peroneal - EDB     Ankle EDB 5.0 ?6.5 2.6 ?2.0 100 Ankle - EDB 9   10.9     Fib head EDB 11.4  2.9  114 Fib head - Ankle 30 47 ?44 12.8     Pop fossa EDB 13.5  2.3  77.9 Pop fossa - Fib head 10 46 ?44 9.3         Pop fossa - Ankle      R Tibial - AH     Ankle AH 5.1 ?5.8 14.6 ?4.0 100 Ankle - AH 9   33.1     Pop fossa AH 13.9  10.7  73.1 Pop fossa - Ankle 37 42 ?41 26.7                 SNC    Nerve / Sites Rec. Site Peak Lat Ref.  Amp Ref. Segments Distance Peak Diff Ref.    ms ms V V  cm ms ms  R Sural - Ankle (Calf)     Calf Ankle 4.2 ?4.4 6 ?6 Calf - Ankle 14    R Superficial peroneal - Ankle     Lat leg Ankle 4.2 ?4.4 6 ?6 Lat leg - Ankle 14    R Median, Ulnar - Transcarpal comparison     Median Palm Wrist 2.4 ?2.2 28 ?35 Median Palm - Wrist 8       Ulnar Palm Wrist 2.2 ?2.2 10 ?12 Ulnar Palm - Wrist 8          Median Palm - Ulnar Palm  0.2 ?0.4  L Median, Ulnar - Transcarpal comparison     Median Palm Wrist 3.9 ?2.2 8 ?35 Median Palm - Wrist 8       Ulnar Palm Wrist 2.1 ?2.2 22 ?12 Ulnar Palm - Wrist 8          Median Palm - Ulnar Palm  1.8 ?0.4  R Median - Orthodromic (Dig II, Mid palm)     Dig II Wrist 3.4 ?3.4 12 ?10 Dig II - Wrist 13    L Median - Orthodromic (Dig II, Mid palm)     Dig II Wrist 5.0 ?3.4 1 ?10 Dig II - Wrist 13    R Ulnar - Orthodromic, (Dig V, Mid palm)     Dig V Wrist 3.0 ?3.1 7 ?5 Dig V - Wrist 11    L Ulnar - Orthodromic, (Dig V, Mid palm)     Dig V Wrist 2.7 ?3.1 12 ?5 Dig V - Wrist 11                       F  Wave    Nerve F Lat Ref.   ms ms  R Tibial - AH 53.1 ?56.0  R Ulnar - ADM 29.9 ?32.0  L Ulnar - ADM 30.3 ?32.0           EMG full       EMG Summary Table    Spontaneous MUAP Recruitment    Muscle IA Fib PSW Fasc Other Amp Dur. Poly Pattern  L. Deltoid Normal None None None _______ Normal Normal Normal Normal  L. Triceps brachii Normal None None None _______ Normal Normal Normal Normal  L. Pronator teres Normal None None None _______ Normal Normal Normal Normal  L. First dorsal interosseous Normal None None None _______ Normal Normal Normal Normal  L. Opponens pollicis Normal None None None _______ Normal Normal Normal Normal

## 2017-12-31 NOTE — Progress Notes (Signed)
Full Name: Megan Daniel Gender: Female MRN #: [id number] Date of Birth: [birth year]    Visit Date: 12/31/17 08:10 Age: 82 Years 0 Months Old Examining Physician: [name], MD     History: - Emg/ncs of both arms and one leg to eval for CTS and progression of neuropathy in the feet.   Summary: EMG/NCS was performed on the bilateral upper extremities and right lower extremities  The left  median APB motor nerve showed prolonged distal onset latency (5.3 ms, N<4.4)   The left  Median 2nd Digit orthodromic sensory nerve showed prolonged distal peak latency (5.0 ms, N<3.4) and reduced amplitude(1 uV, N>10)  The left median/ulnar (palm) comparison nerve showed prolonged distal peak latency (Median Palm, 5.0 ms, N<2.2) and abnormal peak latency difference (Median Palm-Ulnar Palm, 1.8 ms, N<0.4) with a relative median delay.    All remaining nerves (as detailed in the following tables) were within normal limits All muscles (as detailed in the following tables) were within normal limits  Conclusion: There is left moderately-severe carpal Tunnel Syndrome. No progression of distal polyneuropathy in the lower extremities.  [name] M.D.  [facility] [place], [place] [address] Tel: [phone] Fax: [phone]        [facility]    Nerve / Sites Muscle Latency Ref. Amplitude Ref. Rel Amp Segments Distance Velocity Ref. Area    ms ms mV mV %  cm m/s m/s mVms  R Median - APB     Wrist APB 3.7 ?4.4 4.4 ?4.0 100 Wrist - APB 7   14.5     Upper arm APB 8.4  4.0  90.8 Upper arm - Wrist 24 51 ?49 12.7  L Median - APB     Wrist APB 5.3 ?4.4 6.8 ?4.0 100 Wrist - APB 7   23.9     Upper arm APB 10.1  6.3  92.5 Upper arm - Wrist 24 50 ?49 21.9  R Ulnar - ADM     Wrist ADM 2.7 ?3.3 6.6 ?6.0 100 Wrist - ADM 7   22.1     B.Elbow ADM 6.8  6.7  103 B.Elbow - Wrist 21 52 ?49 23.1     A.Elbow ADM 9.1  6.3  94.2 A.Elbow - B.Elbow 12 51 ?49 22.7         A.Elbow  - Wrist      L Ulnar - ADM     Wrist ADM 2.8 ?3.3 6.8 ?6.0 100 Wrist - ADM 7   23.9     B.Elbow ADM 6.9  6.5  95.8 B.Elbow - Wrist 21 51 ?49 22.7     A.Elbow ADM 8.9  5.9  90.1 A.Elbow - B.Elbow 10 51 ?49 22.7         A.Elbow - Wrist      R Peroneal - EDB     Ankle EDB 5.0 ?6.5 2.6 ?2.0 100 Ankle - EDB 9   10.9     Fib head EDB 11.4  2.9  114 Fib head - Ankle 30 47 ?44 12.8     Pop fossa EDB 13.5  2.3  77.9 Pop fossa - Fib head 10 46 ?44 9.3         Pop fossa - Ankle      R Tibial - AH     Ankle AH 5.1 ?5.8 14.6 ?4.0 100 Ankle - AH 9   33.1     Pop fossa AH  13.9  10.7  73.1 Pop fossa - Ankle 37 42 ?41 26.7                 SNC    Nerve / Sites Rec. Site Peak Lat Ref.  Amp Ref. Segments Distance Peak Diff Ref.    ms ms V V  cm ms ms  R Sural - Ankle (Calf)     Calf Ankle 4.2 ?4.4 6 ?6 Calf - Ankle 14    R Superficial peroneal - Ankle     Lat leg Ankle 4.2 ?4.4 6 ?6 Lat leg - Ankle 14    R Median, Ulnar - Transcarpal comparison     Median Palm Wrist 2.4 ?2.2 28 ?35 Median Palm - Wrist 8       Ulnar Palm Wrist 2.2 ?2.2 10 ?12 Ulnar Palm - Wrist 8          Median Palm - Ulnar Palm  0.2 ?0.4  L Median, Ulnar - Transcarpal comparison     Median Palm Wrist 3.9 ?2.2 8 ?35 Median Palm - Wrist 8       Ulnar Palm Wrist 2.1 ?2.2 22 ?12 Ulnar Palm - Wrist 8          Median Palm - Ulnar Palm  1.8 ?0.4  R Median - Orthodromic (Dig II, Mid palm)     Dig II Wrist 3.4 ?3.4 12 ?10 Dig II - Wrist 13    L Median - Orthodromic (Dig II, Mid palm)     Dig II Wrist 5.0 ?3.4 1 ?10 Dig II - Wrist 13    R Ulnar - Orthodromic, (Dig V, Mid palm)     Dig V Wrist 3.0 ?3.1 7 ?5 Dig V - Wrist 11    L Ulnar - Orthodromic, (Dig V, Mid palm)     Dig V Wrist 2.7 ?3.1 12 ?5 Dig V - Wrist 14                       F  Wave    Nerve F Lat Ref.   ms ms  R Tibial - AH 53.1 ?56.0  R Ulnar - ADM 29.9 ?32.0  L Ulnar - ADM 30.3 ?32.0           EMG full       EMG Summary Table    Spontaneous MUAP Recruitment    Muscle IA Fib PSW Fasc Other Amp Dur. Poly Pattern  L. Deltoid Normal None None None _______ Normal Normal Normal Normal  L. Triceps brachii Normal None None None _______ Normal Normal Normal Normal  L. Pronator teres Normal None None None _______ Normal Normal Normal Normal  L. First dorsal interosseous Normal None None None _______ Normal Normal Normal Normal  L. Opponens pollicis Normal None None None _______ Normal Normal Normal Normal

## 2017-12-31 NOTE — Progress Notes (Signed)
Discussed carpal tunnel Syndrome, etiologies, therapeutic options including conservative measures and surgical measures. Patient will wear a wrist brace and in a few months she will request a referral to discuss surgery. The neuropathy in her feet are likely small-fiber neuropathy and there is no evidence of progression on the emg/ncs.  A total of 15 minutes was spent face-to-face with this patient. Over half this time was spent on counseling patient on the CTS, small-fiber peripheral polyneuropathydiagnosis and different diagnostic and therapeutic options, risks ans benefits of management, compliance, or risk factor reduction and education. This does not include any time spent performing emg/ncs.

## 2018-01-04 ENCOUNTER — Encounter (HOSPITAL_COMMUNITY): Payer: Self-pay

## 2018-01-04 ENCOUNTER — Ambulatory Visit (HOSPITAL_COMMUNITY): Payer: Medicare Other | Attending: Neurology

## 2018-01-04 ENCOUNTER — Other Ambulatory Visit: Payer: Self-pay

## 2018-01-04 DIAGNOSIS — M6281 Muscle weakness (generalized): Secondary | ICD-10-CM | POA: Insufficient documentation

## 2018-01-04 DIAGNOSIS — R2689 Other abnormalities of gait and mobility: Secondary | ICD-10-CM

## 2018-01-04 DIAGNOSIS — Z9181 History of falling: Secondary | ICD-10-CM | POA: Insufficient documentation

## 2018-01-04 NOTE — Therapy (Signed)
[facility] [address] [place], [place], [address] Phone: [phone]   Fax:  [phone]  Physical Therapy Treatment  Patient Details  Name: Megan Daniel MRN: [id number] Date of Birth: [birth year] Referring Provider: Melvenia Beam, MD   Encounter Date: 01/04/2018  PT End of Session - 01/04/18 1001    Visit Number  8    Number of Visits  13    Date for PT Re-Evaluation  01/14/18 mini-reassess 12/24/17    Authorization Type  Medicare Part A and B (Blue cross blue shield secondary); no auth required, visits based on medical guidelines - MCR    Authorization Time Period  12/03/17 - 01/14/18    Authorization - Visit Number  3    Authorization - Number of Visits  10    PT Start Time  1001 patient arrived late    PT Stop Time  1040    PT Time Calculation (min)  39 min    Equipment Utilized During Treatment  Gait belt    Activity Tolerance  Patient tolerated treatment well    Behavior During Therapy  WFL for tasks assessed/performed       Past Medical History:  Diagnosis Date  . Abdominal pain   . Allergic rhinitis   . Anxiety 10/03/2013  . Arthralgia 03/31/2014  . Bronchitis 09/09/2013  . Cellulitis   . Diverticulitis   . Diverticulosis   . Fatigue   . GERD (gastroesophageal reflux disease)   . Gout   . Headache   . Headache   . Hyperchloremia   . Hypercholesterolemia   . Hypertension   . Hypothyroidism   . Lumbar strain   . Major depressive disorder   . Migraine   . Osteoporosis   . Rheumatoid arthritis [facility])     Past Surgical History:  Procedure Laterality Date  . CARPAL TUNNEL RELEASE     25 years ago  . CHOLECYSTECTOMY     20 yrs ago  . COLONOSCOPY    . COLONOSCOPY N/A 10/25/2014   Procedure: COLONOSCOPY;  Surgeon: Rogene Houston, MD;  [facility];  Service: Endoscopy;  Laterality: N/A;  830  . ESOPHAGOGASTRODUODENOSCOPY N/A 10/25/2014   Procedure: ESOPHAGOGASTRODUODENOSCOPY (EGD);  Surgeon:  Rogene Houston, MD;  [facility];  Service: Endoscopy;  Laterality: N/A;  . HEMORROIDECTOMY     2005  . POLYPECTOMY    . TOTAL ABDOMINAL HYSTERECTOMY     Age 82  . UPPER GASTROINTESTINAL ENDOSCOPY      There were no vitals filed for this visit.  Subjective Assessment - 01/04/18 1008    Subjective  Patient is having a little heart burn today but other than this feels alright. She reports she saw Dr. Jaynee Eagles for her NCV test and that she was told she has carpel tunnel in her Lt hand, she is wearing a brace for it and reports it has started to help. She also received a shot in her Rt shuolder for pain and that has helped.    Limitations  Standing;Walking;House hold activities    How long can you sit comfortably?  unlimited    How long can you stand comfortably?  unlimited    How long can you walk comfortably?  not limited    Patient Stated Goals  want to get back to exercises, walking/hiking, and doing yoga    Currently in Pain?  No/denies        [facility] Adult PT Treatment/Exercise -  01/04/18 0001      Balance Poses: Yoga   Warrior I  2 reps;Limitations;30 seconds    Warrior I Limitations  bil LE      Knee/Hip Exercises: Standing   Forward Lunges  Both;1 set;15 reps;Limitations    Forward Lunges Limitations  6" box    Lateral Step Up  Both;15 reps;Hand Hold: 0;Step Height: 6"    Forward Step Up  Both;15 reps;Hand Hold: 0;Step Height: 6"    Other Standing Knee Exercises  side stepping with red TB 3 RTx 15'       Balance Exercises - 01/04/18 1032      Balance Exercises: Standing   Rockerboard  Anterior/posterior;Lateral;EO;Intermittent UE support 2x 1 minute each ( 4 minutes total)    Tandem Gait  Forward;Limitations;Foam/compliant surface 6 reps    Sidestepping  Foam/compliant support;2 reps 2x 18' both directions        PT Education - 01/04/18 1045    Education provided  Yes    Education Details  Updated HEP for balance and strengthening.    Person(s) Educated   Patient    Methods  Explanation;Handout    Comprehension  Verbalized understanding;Returned demonstration       PT Short Term Goals - 01/04/18 1101      PT SHORT TERM GOAL #1   Title  Patient will be independent with HEP to progress functional strength and balance to progress towards goals and PLOF with reduced fall risk.    Time  2    Period  Weeks    Status  Achieved      PT SHORT TERM GOAL #2   Title  tient will improve bil LE strength for limited groups by 1/2 grade to improve muscle endurance with activities and overall gait quality and performance with stair mobility.    Time  3    Period  Weeks    Status  Achieved      PT SHORT TERM GOAL #3   Title  Patient will improve DGI score by 3 points to show significant improvement in reduced fall risk while ambulating.    Time  3    Period  Weeks    Status  Achieved        PT Long Term Goals - 01/04/18 1101      PT LONG TERM GOAL #1   Title  Patient will improve DGI score by 6 points to show further significant improvement in reduced fall risk and achieve a score of 20/24 indicating she is at decresed risk of falling while ambulating.    Time  6    Period  Weeks    Status  Achieved      PT LONG TERM GOAL #2   Title  Patient will improve bil LE strength for limited groups by 1 grade to improve muscle endurance with activities and overall gait quality and performance with stair mobility.    Time  6    Period  Weeks    Status  On-going      PT LONG TERM GOAL #3   Title  Patient will report being able to walk 1 mile or greater with no difficulty 2x/week or more to return to PLOF and ejoyable exercise activities to improve health and wellness.    Time  6    Period  Weeks    Status  On-going        Plan - 01/04/18 1005    Clinical Impression Statement  Patient arrived ~10  minutes late to therapy session again this date. Session focused on functional hip strengthening and balance training. She continued with warrior 1  pose for balance training and showed good carry over with form from last session. She continues to improve ant/post weight shifting with rockerboard but continues to require intermittent UE support to prevent LOB. She has been educated on new strengthening and balance exercises for HEP to be performed at [employer]. She will continue to benefit from skilled PT interventions to address deficits and improve balance to progress towards goals.    Rehab Potential  Good    Clinical Impairments Affecting Rehab Potential  (+) motivated, (+) PLOF    PT Frequency  2x / week    PT Duration  6 weeks    PT Treatment/Interventions  ADLs/Self Care Home Management;Aquatic Therapy;DME Instruction;Gait training;Stair training;Functional mobility training;Therapeutic activities;Therapeutic exercise;Balance training;Neuromuscular re-education;Patient/family education;Manual techniques;Passive range of motion;Vestibular    PT Next Visit Plan  Continue to progress balance activities on dynamic surfaces and reduce UE assist as able. Continue with side stepping with TB/dynamic surface and wall squats with hold for endurance next session.  Continue step downs for functional strengthening next session and progress balance as able. Continue  yoga balance poses/training; add warrior 2.    PT Home Exercise Plan  12/07/17 - tandem at counter, SLS at counter, bridge; 12/14/17 - post pelvic tilt; 01/04/18 - tandem gait, side step wtih TB around counter    Consulted and Agree with Plan of Care  Patient       Patient will benefit from skilled therapeutic intervention in order to improve the following deficits and impairments:  Decreased balance, Decreased endurance, Decreased activity tolerance, Decreased strength, Dizziness, Impaired sensation, Difficulty walking  Visit Diagnosis: Other abnormalities of gait and mobility  Muscle weakness (generalized)  History of falling     Problem List Patient Active Problem List    Diagnosis Date Noted  . Idiopathic small fiber peripheral neuropathy 12/31/2017  . CTS (carpal tunnel syndrome) 11/19/2017  . Idiopathic polyneuropathy 11/19/2017  . Varicose veins of left lower extremity with complications 52/84/1324  . Headache(784.0) 04/08/2014  . Joint pain 04/08/2014  . Other malaise and fatigue 04/08/2014  . Lyme disease 04/08/2014  . Neuropathy 04/08/2014  . Achalasia 04/03/2014  . Personal history of colonic polyps 04/03/2014  . GERD (gastroesophageal reflux disease) 04/03/2014    Kipp Brood, PT, DPT Physical Therapist with [facility]  01/04/2018 11:02 AM    [place], [place], [address] Phone: [phone]   Fax:  [phone]  Name: Daryn Pisani MRN: [id number] Date of Birth: 11-09-1935

## 2018-01-06 ENCOUNTER — Ambulatory Visit (HOSPITAL_COMMUNITY): Payer: Medicare Other

## 2018-01-06 DIAGNOSIS — S30861A Insect bite (nonvenomous) of abdominal wall, initial encounter: Secondary | ICD-10-CM | POA: Diagnosis not present

## 2018-01-07 ENCOUNTER — Ambulatory Visit (HOSPITAL_COMMUNITY): Payer: Medicare Other

## 2018-01-07 ENCOUNTER — Other Ambulatory Visit: Payer: Self-pay

## 2018-01-07 ENCOUNTER — Encounter (HOSPITAL_COMMUNITY): Payer: Self-pay

## 2018-01-07 DIAGNOSIS — R2689 Other abnormalities of gait and mobility: Secondary | ICD-10-CM | POA: Diagnosis not present

## 2018-01-07 DIAGNOSIS — M6281 Muscle weakness (generalized): Secondary | ICD-10-CM

## 2018-01-07 DIAGNOSIS — Z9181 History of falling: Secondary | ICD-10-CM

## 2018-01-07 NOTE — Therapy (Signed)
[facility] [address] [place], [place], [address] Phone: [phone]   Fax:  [phone]  Physical Therapy Treatment  Patient Details  Name: Megan Daniel MRN: [id number] Date of Birth: 1935-11-12 Referring Provider: Melvenia Beam, MD   Encounter Date: 01/07/2018  PT End of Session - 01/07/18 1411    Visit Number  9    Number of Visits  13    Date for PT Re-Evaluation  01/14/18 mini-reassess 12/24/17    Authorization Type  Medicare Part A and B (Blue cross blue shield secondary); no auth required, visits based on medical guidelines - MCR    Authorization Time Period  12/03/17 - 01/14/18    Authorization - Visit Number  4    Authorization - Number of Visits  10    PT Start Time  2376    PT Stop Time  1432    PT Time Calculation (min)  39 min    Equipment Utilized During Treatment  Gait belt    Activity Tolerance  Patient tolerated treatment well    Behavior During Therapy  WFL for tasks assessed/performed       Past Medical History:  Diagnosis Date  . Abdominal pain   . Allergic rhinitis   . Anxiety 10/03/2013  . Arthralgia 03/31/2014  . Bronchitis 09/09/2013  . Cellulitis   . Diverticulitis   . Diverticulosis   . Fatigue   . GERD (gastroesophageal reflux disease)   . Gout   . Headache   . Headache   . Hyperchloremia   . Hypercholesterolemia   . Hypertension   . Hypothyroidism   . Lumbar strain   . Major depressive disorder   . Migraine   . Osteoporosis   . Rheumatoid arthritis [facility])     Past Surgical History:  Procedure Laterality Date  . CARPAL TUNNEL RELEASE     25 years ago  . CHOLECYSTECTOMY     20 yrs ago  . COLONOSCOPY    . COLONOSCOPY N/A 10/25/2014   Procedure: COLONOSCOPY;  Surgeon: Rogene Houston, MD;  [facility];  Service: Endoscopy;  Laterality: N/A;  830  . ESOPHAGOGASTRODUODENOSCOPY N/A 10/25/2014   Procedure: ESOPHAGOGASTRODUODENOSCOPY (EGD);  Surgeon: Rogene Houston, MD;   [facility];  Service: Endoscopy;  Laterality: N/A;  . HEMORROIDECTOMY     2005  . POLYPECTOMY    . TOTAL ABDOMINAL HYSTERECTOMY     Age 82  . UPPER GASTROINTESTINAL ENDOSCOPY      There were no vitals filed for this visit.  Subjective Assessment - 01/07/18 1356    Subjective  Patient reports she found a tick bite that looked unusual and went to her doctor yesterday he prescribed doxycycline. She reports no pain today and she has a new wrist brace that is comfortable. Patient reports she has tried to do her HEP with side stepping around her counter and denies difficulty.    Limitations  Standing;Walking;House hold activities    How long can you sit comfortably?  unlimited    How long can you stand comfortably?  unlimited    How long can you walk comfortably?  not limited    Patient Stated Goals  want to get back to exercises, walking/hiking, and doing yoga    Currently in Pain?  No/denies        [facility] Adult PT Treatment/Exercise - 01/07/18 0001      Balance Poses: Yoga   Warrior I  2 reps;Limitations;30  seconds    Warrior I Limitations  bil LE    Warrior II  2 reps;30 seconds;Limitations    Warrior II Limitations  bil LE    Tree Pose  5 reps;Time;Limitations    Tree Pose Limitations  10 seonds, 5 reps Bil LE      Knee/Hip Exercises: Standing   Lateral Step Up  Both;15 reps;Step Height: 6";Hand Hold: 1       Balance Exercises - 01/07/18 1415      Balance Exercises: Standing   Rockerboard  Anterior/posterior;Lateral;EO;Intermittent UE support 2x 1 minute each ( 4 minutes total)    Tandem Gait  Forward;Limitations;Foam/compliant surface minguard to prevent LOB, gait belt, 15'    Sidestepping  Foam/compliant support;2 reps 2x 18' both directions    Step Over Hurdles / Cones  4x forward over 4x 6" hurdles; 2x RT lateral over 4x 6" hurdles       PT Education - 01/07/18 1409    Education provided  Yes    Education Details  Patient provided updated HEP and  instruted on balance exercises throughout session.    Person(s) Educated  Patient    Methods  Explanation;Handout;Demonstration    Comprehension  Verbalized understanding;Returned demonstration       PT Short Term Goals - 01/04/18 1101      PT SHORT TERM GOAL #1   Title  Patient will be independent with HEP to progress functional strength and balance to progress towards goals and PLOF with reduced fall risk.    Time  2    Period  Weeks    Status  Achieved      PT SHORT TERM GOAL #2   Title  tient will improve bil LE strength for limited groups by 1/2 grade to improve muscle endurance with activities and overall gait quality and performance with stair mobility.    Time  3    Period  Weeks    Status  Achieved      PT SHORT TERM GOAL #3   Title  Patient will improve DGI score by 3 points to show significant improvement in reduced fall risk while ambulating.    Time  3    Period  Weeks    Status  Achieved        PT Long Term Goals - 01/04/18 1101      PT LONG TERM GOAL #1   Title  Patient will improve DGI score by 6 points to show further significant improvement in reduced fall risk and achieve a score of 20/24 indicating she is at decresed risk of falling while ambulating.    Time  6    Period  Weeks    Status  Achieved      PT LONG TERM GOAL #2   Title  Patient will improve bil LE strength for limited groups by 1 grade to improve muscle endurance with activities and overall gait quality and performance with stair mobility.    Time  6    Period  Weeks    Status  On-going      PT LONG TERM GOAL #3   Title  Patient will report being able to walk 1 mile or greater with no difficulty 2x/week or more to return to PLOF and ejoyable exercise activities to improve health and wellness.    Time  6    Period  Weeks    Status  On-going         Plan - 01/07/18 1414  Clinical Impression Statement  Therapy session focused on balance training and advanced yoga poses and SLS  training. She was provided updated HEP with warrior 1/2 poses and initaited low level tree pose for SLS balance training. She demonstrated good postural and balance strategies during rockerboard and side stepping on foam and initiated hurdles step over today. She will continue to benefit from skilled PT interventions to address current impairments.    Rehab Potential  Good    Clinical Impairments Affecting Rehab Potential  (+) motivated, (+) PLOF    PT Frequency  2x / week    PT Duration  6 weeks    PT Treatment/Interventions  ADLs/Self Care Home Management;Aquatic Therapy;DME Instruction;Gait training;Stair training;Functional mobility training;Therapeutic activities;Therapeutic exercise;Balance training;Neuromuscular re-education;Patient/family education;Manual techniques;Passive range of motion;Vestibular    PT Next Visit Plan  Re-assess next session. Continue with balance activities for SLS and provide information on fitness centers locally.    PT Home Exercise Plan  12/07/17 - tandem at counter, SLS at counter, bridge; 12/14/17 - post pelvic tilt; 01/04/18 - tandem gait, side step wtih TB around counter    Consulted and Agree with Plan of Care  Patient       Patient will benefit from skilled therapeutic intervention in order to improve the following deficits and impairments:  Decreased balance, Decreased endurance, Decreased activity tolerance, Decreased strength, Dizziness, Impaired sensation, Difficulty walking  Visit Diagnosis: Other abnormalities of gait and mobility  Muscle weakness (generalized)  History of falling     Problem List Patient Active Problem List   Diagnosis Date Noted  . Idiopathic small fiber peripheral neuropathy 12/31/2017  . CTS (carpal tunnel syndrome) 11/19/2017  . Idiopathic polyneuropathy 11/19/2017  . Varicose veins of left lower extremity with complications 41/93/7902  . Headache(784.0) 04/08/2014  . Joint pain 04/08/2014  . Other malaise and fatigue  04/08/2014  . Lyme disease 04/08/2014  . Neuropathy 04/08/2014  . Achalasia 04/03/2014  . Personal history of colonic polyps 04/03/2014  . GERD (gastroesophageal reflux disease) 04/03/2014    Kipp Brood, PT, DPT Physical Therapist with [facility]  01/07/2018 4:21 PM    [facility] [address] [place], [place], [address] Phone: [phone]   Fax:  [phone]  Name: Jaiona Simien MRN: [id number] Date of Birth: 1936/03/22

## 2018-01-07 NOTE — Telephone Encounter (Signed)
Called pt & LVM (ok per DPR) asking for call back to let us know if she would like the lab results and after visit summary from Dr. Quintin Alto mailed to her or she can come and pick them up.

## 2018-01-07 NOTE — Telephone Encounter (Signed)
Pt requesting labs and OV be mailed to her [id number] in [place]

## 2018-01-07 NOTE — Telephone Encounter (Signed)
Labs and ov summary mailed to pt @ address given.

## 2018-01-07 NOTE — Patient Instructions (Signed)
REPS: 2-3  SETS: 1-2  HOLD: 30 seconds  WEEKLY: 7x  DAILY: 1x Setup Begin in a downward dog position, with your hands and feet flat on the floor. Movement Place one foot on the ground between your hands, rotate your back foot 90 degrees, then raise your arms toward the ceiling, keeping your front leg bent and back leg straight. Hold this position. Tip Make sure to perform these movements slowly and maintain your balance during the exercise. Reverse the movement to return to the starting position. STEP 1 STEP 2 STEP 3 STEP 4 Warrior II REPS: 2-3  SETS: 1-2  HOLD: 30 seconds  WEEKLY: 7x  DAILY: 1x Setup Begin in a downward dog position, with your hands and feet flat on the floor. Movement Place one foot on the ground between your hands, then rotate your back foot 90 degrees. Raise your arms straight toward the ceiling, then lower them to shoulder level, so they are in line with your legs, and keep your head facing forward. Hold this position. Tip Make sure to perform these movements slowly and maintain your balance during the exercise. Reverse the movement to return to the starting position.

## 2018-01-11 ENCOUNTER — Encounter (HOSPITAL_COMMUNITY): Payer: Self-pay

## 2018-01-11 ENCOUNTER — Other Ambulatory Visit: Payer: Self-pay

## 2018-01-11 ENCOUNTER — Ambulatory Visit (HOSPITAL_COMMUNITY): Payer: Medicare Other

## 2018-01-11 DIAGNOSIS — Z9181 History of falling: Secondary | ICD-10-CM | POA: Diagnosis not present

## 2018-01-11 DIAGNOSIS — M6281 Muscle weakness (generalized): Secondary | ICD-10-CM

## 2018-01-11 DIAGNOSIS — R2689 Other abnormalities of gait and mobility: Secondary | ICD-10-CM

## 2018-01-11 NOTE — Therapy (Signed)
[place], [place], [address] Phone: [phone]   Fax:  [phone]  Physical Therapy Treatment/Discharge Summary  Patient Details  Name: Megan Daniel MRN: [id number] Date of Birth: [birth year] Referring Provider: Melvenia Beam, MD   Encounter Date: 01/11/2018   Progress Note Reporting Period 12/24/17 to 01/11/18  See note below for Objective Data and Assessment of Progress/Goals.   PHYSICAL THERAPY DISCHARGE SUMMARY  Visits from Start of Care: 10  Current functional level related to goals / functional outcomes: Re-assessment performed today and patient has met all short term goals long term goals. She has made significant improvement in all limited muscle groups with MMT for bil LE and remains slightly limited with hip extension and hamstring strength. She previously improved her DGI indicating a decreased risk of falling. Megan Daniel has demonstrated excellent compliance with HEP and has been educated on updated advanced HEP to continue progressing. She has been educated on benefits of local fitness facilities to participate in regular exercise programs and continue strengthening/improving balance. She has reported feeling confident she can continue independently and will be discharged following today's session.    Remaining deficits: See below details   Education / Equipment: See below details  Plan: Patient agrees to discharge.  Patient goals were met. Patient is being discharged due to meeting the stated rehab goals.  ?????       PT End of Session - 01/11/18 1141    Visit Number  10    Number of Visits  13    Date for PT Re-Evaluation  01/14/18 mini-reassess 12/24/17    Authorization Type  Medicare Part A and B (Blue cross blue shield secondary); no auth required, visits based on medical guidelines - MCR    Authorization Time Period  12/03/17 - 01/14/18    Authorization - Visit Number  5    Authorization - Number of Visits  10    PT Start Time  1119    PT Stop Time  1157    PT Time Calculation (min)  38 min    Activity Tolerance  Patient tolerated treatment well    Behavior During Therapy  WFL for tasks assessed/performed       Past Medical History:  Diagnosis Date  . Abdominal pain   . Allergic rhinitis   . Anxiety 10/03/2013  . Arthralgia 03/31/2014  . Bronchitis 09/09/2013  . Cellulitis   . Diverticulitis   . Diverticulosis   . Fatigue   . GERD (gastroesophageal reflux disease)   . Gout   . Headache   . Headache   . Hyperchloremia   . Hypercholesterolemia   . Hypertension   . Hypothyroidism   . Lumbar strain   . Major depressive disorder   . Migraine   . Osteoporosis   . Rheumatoid arthritis [facility])     Past Surgical History:  Procedure Laterality Date  . CARPAL TUNNEL RELEASE     25 years ago  . CHOLECYSTECTOMY     20 yrs ago  . COLONOSCOPY    . COLONOSCOPY N/A 10/25/2014   Procedure: COLONOSCOPY;  Surgeon: Rogene Houston, MD;  [facility];  Service: Endoscopy;  Laterality: N/A;  830  . ESOPHAGOGASTRODUODENOSCOPY N/A 10/25/2014   Procedure: ESOPHAGOGASTRODUODENOSCOPY (EGD);  Surgeon: Rogene Houston, MD;  Location: [facility];  Service: Endoscopy;  Laterality: N/A;  . HEMORROIDECTOMY     2005  . POLYPECTOMY    . TOTAL ABDOMINAL HYSTERECTOMY  Age 82  . UPPER GASTROINTESTINAL ENDOSCOPY      There were no vitals filed for this visit.  Subjective Assessment - 01/11/18 1140    Subjective  Patient reports she is feeling well but has been busy this past weekend with arranging her sisters affiars to move into a new assisted living facility. She reports she has not been ablet o do all of herexercisees but tried to find time to perform some of them. She also reports she has been doing yard work and walking her dog with no difficulty in teh last week.    Limitations  Standing;Walking;House hold activities    How long can you sit  comfortably?  unlimited    How long can you stand comfortably?  unlimited    How long can you walk comfortably?  not limited    Patient Stated Goals  want to get back to exercises, walking/hiking, and doing yoga    Currently in Pain?  No/denies       [facility] PT Assessment - 01/11/18 0001      Assessment   Medical Diagnosis  Balance Impairment secondary to polyneuropathy    Referring Provider  Melvenia Beam, MD    Prior Therapy  no      Precautions   Precautions  None      Restrictions   Weight Bearing Restrictions  No      Cognition   Overall Cognitive Status  Within Functional Limits for tasks assessed      Strength   Right Hip Flexion  4+/5    Right Hip Extension  4+/5    Right Hip ABduction  5/5    Left Hip Flexion  4+/5    Left Hip Extension  4+/5    Left Hip ABduction  4+/5    Right Knee Flexion  4+/5    Right Knee Extension  5/5    Left Knee Flexion  4+/5    Left Knee Extension  5/5    Right Ankle Dorsiflexion  5/5    Left Ankle Dorsiflexion  5/5        [facility] Adult PT Treatment/Exercise - 01/11/18 0001      Knee/Hip Exercises: Standing   Wall Squat  2 sets;15 reps;5 seconds      Knee/Hip Exercises: Supine   Straight Leg Raises  Strengthening;Both;2 sets;15 reps;Limitations    Straight Leg Raises Limitations  3 second holds       Balance Exercises - 01/11/18 1158      Balance Exercises: Standing   Tandem Gait  Forward;Limitations;Intermittent upper extremity support 2x RT 18'        PT Education - 01/11/18 1131    Education provided  Yes    Education Details  Educated on progress in therapy and updated HEP to target limited muscle groups. Educated on benefits of [employer] or local fitness facility to continue strengthening and working on balance activities. Educated on appropriate classes like basic/entry level yoga, light strengthening, and aquatic classes.    Person(s) Educated  Patient    Methods  Explanation;Handout    Comprehension  Verbalized  understanding;Returned demonstration       PT Short Term Goals - 01/11/18 1126      PT SHORT TERM GOAL #1   Title  Patient will be independent with HEP to progress functional strength and balance to progress towards goals and PLOF with reduced fall risk.    Time  2    Period  Weeks    Status  Achieved      PT SHORT TERM GOAL #2   Title  tient will improve bil LE strength for limited groups by 1/2 grade to improve muscle endurance with activities and overall gait quality and performance with stair mobility.    Time  3    Period  Weeks    Status  Achieved      PT SHORT TERM GOAL #3   Title  Patient will improve DGI score by 3 points to show significant improvement in reduced fall risk while ambulating.    Time  3    Period  Weeks    Status  Achieved        PT Long Term Goals - 01/11/18 1126      PT LONG TERM GOAL #1   Title  Patient will improve DGI score by 6 points to show further significant improvement in reduced fall risk and achieve a score of 20/24 indicating she is at decresed risk of falling while ambulating.    Time  6    Period  Weeks    Status  Achieved      PT LONG TERM GOAL #2   Title  Patient will improve bil LE strength for limited groups by 1 grade to improve muscle endurance with activities and overall gait quality and performance with stair mobility.    Time  6    Period  Weeks    Status  Partially Met      PT LONG TERM GOAL #3   Title  Patient will report being able to walk 1 mile or greater with no difficulty 2x/week or more to return to PLOF and ejoyable exercise activities to improve health and wellness.    Time  6    Period  Weeks    Status  Achieved        Plan - 01/11/18 1138    Clinical Impression Statement  Re-assessment performed today and patient has met all short term goals long term goals. She has made significant improvement in all limited muscle groups with MMT for bil LE and remains slightly limited with hip extension and hamstring  strength. She previously improved her DGI indicating a decreased risk of falling. Megan Daniel has demonstrated excellent compliance with HEP and has been educated on updated advanced HEP to continue progressing. She has been educated on benefits of local fitness facilities to participate in regular exercise programs and continue strengthening/improving balance. She has reported feeling confident she can continue independently and will be discharged following today's session.    Rehab Potential  Good    Clinical Impairments Affecting Rehab Potential  (+) motivated, (+) PLOF    PT Frequency  2x / week    PT Duration  6 weeks    PT Treatment/Interventions  ADLs/Self Care Home Management;Aquatic Therapy;DME Instruction;Gait training;Stair training;Functional mobility training;Therapeutic activities;Therapeutic exercise;Balance training;Neuromuscular re-education;Patient/family education;Manual techniques;Passive range of motion;Vestibular    PT Next Visit Plan  Discharge this session    PT Home Exercise Plan  12/07/17 - tandem at counter, SLS at counter, bridge; 12/14/17 - post pelvic tilt; 01/04/18 - tandem gait, side step wtih TB around counter; 01/11/18 - SLR, wall squats, quad stretch    Consulted and Agree with Plan of Care  Patient       Patient will benefit from skilled therapeutic intervention in order to improve the following deficits and impairments:  Decreased balance, Decreased endurance, Decreased activity tolerance, Decreased strength, Dizziness, Impaired sensation, Difficulty walking  Visit Diagnosis: Other  abnormalities of gait and mobility  Muscle weakness (generalized)  History of falling     Problem List Patient Active Problem List   Diagnosis Date Noted  . Idiopathic small fiber peripheral neuropathy 12/31/2017  . CTS (carpal tunnel syndrome) 11/19/2017  . Idiopathic polyneuropathy 11/19/2017  . Varicose veins of left lower extremity with complications 03/00/9233  .  Headache(784.0) 04/08/2014  . Joint pain 04/08/2014  . Other malaise and fatigue 04/08/2014  . Lyme disease 04/08/2014  . Neuropathy 04/08/2014  . Achalasia 04/03/2014  . Personal history of colonic polyps 04/03/2014  . GERD (gastroesophageal reflux disease) 04/03/2014    Kipp Brood, PT, DPT Physical Therapist with [facility]  01/11/2018 11:59 AM     [place], [place], [address] Phone: [phone]   Fax:  [phone]  Name: Megan Daniel MRN: [id number] Date of Birth: 1935-11-23

## 2018-01-13 ENCOUNTER — Institutional Professional Consult (permissible substitution): Payer: Medicare Other | Admitting: Neurology

## 2018-02-11 DIAGNOSIS — R21 Rash and other nonspecific skin eruption: Secondary | ICD-10-CM | POA: Diagnosis not present

## 2018-02-11 DIAGNOSIS — L299 Pruritus, unspecified: Secondary | ICD-10-CM | POA: Diagnosis not present

## 2018-02-11 DIAGNOSIS — Z6824 Body mass index (BMI) 24.0-24.9, adult: Secondary | ICD-10-CM | POA: Diagnosis not present

## 2018-03-03 DIAGNOSIS — Z6824 Body mass index (BMI) 24.0-24.9, adult: Secondary | ICD-10-CM | POA: Diagnosis not present

## 2018-03-03 DIAGNOSIS — S39012A Strain of muscle, fascia and tendon of lower back, initial encounter: Secondary | ICD-10-CM | POA: Diagnosis not present

## 2018-03-10 DIAGNOSIS — S39012A Strain of muscle, fascia and tendon of lower back, initial encounter: Secondary | ICD-10-CM | POA: Diagnosis not present

## 2018-03-10 DIAGNOSIS — Z6824 Body mass index (BMI) 24.0-24.9, adult: Secondary | ICD-10-CM | POA: Diagnosis not present

## 2018-03-16 DIAGNOSIS — E7801 Familial hypercholesterolemia: Secondary | ICD-10-CM | POA: Diagnosis not present

## 2018-03-16 DIAGNOSIS — E782 Mixed hyperlipidemia: Secondary | ICD-10-CM | POA: Diagnosis not present

## 2018-03-16 DIAGNOSIS — M0609 Rheumatoid arthritis without rheumatoid factor, multiple sites: Secondary | ICD-10-CM | POA: Diagnosis not present

## 2018-03-16 DIAGNOSIS — E7800 Pure hypercholesterolemia, unspecified: Secondary | ICD-10-CM | POA: Diagnosis not present

## 2018-03-16 DIAGNOSIS — R5382 Chronic fatigue, unspecified: Secondary | ICD-10-CM | POA: Diagnosis not present

## 2018-03-16 DIAGNOSIS — I10 Essential (primary) hypertension: Secondary | ICD-10-CM | POA: Diagnosis not present

## 2018-03-18 DIAGNOSIS — F3342 Major depressive disorder, recurrent, in full remission: Secondary | ICD-10-CM | POA: Diagnosis not present

## 2018-03-18 DIAGNOSIS — Z0001 Encounter for general adult medical examination with abnormal findings: Secondary | ICD-10-CM | POA: Diagnosis not present

## 2018-03-18 DIAGNOSIS — E039 Hypothyroidism, unspecified: Secondary | ICD-10-CM | POA: Diagnosis not present

## 2018-03-18 DIAGNOSIS — Z6824 Body mass index (BMI) 24.0-24.9, adult: Secondary | ICD-10-CM | POA: Diagnosis not present

## 2018-03-18 DIAGNOSIS — E782 Mixed hyperlipidemia: Secondary | ICD-10-CM | POA: Diagnosis not present

## 2018-03-18 DIAGNOSIS — M109 Gout, unspecified: Secondary | ICD-10-CM | POA: Diagnosis not present

## 2018-03-18 DIAGNOSIS — G629 Polyneuropathy, unspecified: Secondary | ICD-10-CM | POA: Diagnosis not present

## 2018-03-18 DIAGNOSIS — I10 Essential (primary) hypertension: Secondary | ICD-10-CM | POA: Diagnosis not present

## 2018-03-18 DIAGNOSIS — M0609 Rheumatoid arthritis without rheumatoid factor, multiple sites: Secondary | ICD-10-CM | POA: Diagnosis not present

## 2018-05-06 DIAGNOSIS — Z23 Encounter for immunization: Secondary | ICD-10-CM | POA: Diagnosis not present

## 2018-06-05 DIAGNOSIS — J209 Acute bronchitis, unspecified: Secondary | ICD-10-CM | POA: Diagnosis not present

## 2018-06-05 DIAGNOSIS — Z6824 Body mass index (BMI) 24.0-24.9, adult: Secondary | ICD-10-CM | POA: Diagnosis not present

## 2018-06-05 DIAGNOSIS — Z87891 Personal history of nicotine dependence: Secondary | ICD-10-CM | POA: Diagnosis not present

## 2018-06-05 DIAGNOSIS — J040 Acute laryngitis: Secondary | ICD-10-CM | POA: Diagnosis not present

## 2018-06-21 DIAGNOSIS — Z6824 Body mass index (BMI) 24.0-24.9, adult: Secondary | ICD-10-CM | POA: Diagnosis not present

## 2018-06-21 DIAGNOSIS — J069 Acute upper respiratory infection, unspecified: Secondary | ICD-10-CM | POA: Diagnosis not present

## 2018-08-11 DIAGNOSIS — H43811 Vitreous degeneration, right eye: Secondary | ICD-10-CM | POA: Diagnosis not present

## 2018-08-11 DIAGNOSIS — H1132 Conjunctival hemorrhage, left eye: Secondary | ICD-10-CM | POA: Diagnosis not present

## 2018-08-11 DIAGNOSIS — S0012XD Contusion of left eyelid and periocular area, subsequent encounter: Secondary | ICD-10-CM | POA: Diagnosis not present

## 2018-08-11 DIAGNOSIS — Z961 Presence of intraocular lens: Secondary | ICD-10-CM | POA: Diagnosis not present

## 2018-09-20 DIAGNOSIS — E782 Mixed hyperlipidemia: Secondary | ICD-10-CM | POA: Diagnosis not present

## 2018-09-20 DIAGNOSIS — E039 Hypothyroidism, unspecified: Secondary | ICD-10-CM | POA: Diagnosis not present

## 2018-09-20 DIAGNOSIS — E7801 Familial hypercholesterolemia: Secondary | ICD-10-CM | POA: Diagnosis not present

## 2018-09-20 DIAGNOSIS — F3342 Major depressive disorder, recurrent, in full remission: Secondary | ICD-10-CM | POA: Diagnosis not present

## 2018-09-20 DIAGNOSIS — M0609 Rheumatoid arthritis without rheumatoid factor, multiple sites: Secondary | ICD-10-CM | POA: Diagnosis not present

## 2018-09-20 DIAGNOSIS — I10 Essential (primary) hypertension: Secondary | ICD-10-CM | POA: Diagnosis not present

## 2018-09-20 DIAGNOSIS — K5732 Diverticulitis of large intestine without perforation or abscess without bleeding: Secondary | ICD-10-CM | POA: Diagnosis not present

## 2018-09-27 DIAGNOSIS — I10 Essential (primary) hypertension: Secondary | ICD-10-CM | POA: Diagnosis not present

## 2018-09-27 DIAGNOSIS — E039 Hypothyroidism, unspecified: Secondary | ICD-10-CM | POA: Diagnosis not present

## 2018-09-27 DIAGNOSIS — E782 Mixed hyperlipidemia: Secondary | ICD-10-CM | POA: Diagnosis not present

## 2018-09-27 DIAGNOSIS — G629 Polyneuropathy, unspecified: Secondary | ICD-10-CM | POA: Diagnosis not present

## 2018-09-27 DIAGNOSIS — M0609 Rheumatoid arthritis without rheumatoid factor, multiple sites: Secondary | ICD-10-CM | POA: Diagnosis not present

## 2018-09-27 DIAGNOSIS — F3342 Major depressive disorder, recurrent, in full remission: Secondary | ICD-10-CM | POA: Diagnosis not present

## 2018-09-27 DIAGNOSIS — Z6825 Body mass index (BMI) 25.0-25.9, adult: Secondary | ICD-10-CM | POA: Diagnosis not present

## 2018-09-27 DIAGNOSIS — M109 Gout, unspecified: Secondary | ICD-10-CM | POA: Diagnosis not present

## 2018-11-22 ENCOUNTER — Ambulatory Visit: Payer: Medicare Other | Admitting: Neurology

## 2018-12-21 ENCOUNTER — Ambulatory Visit (INDEPENDENT_AMBULATORY_CARE_PROVIDER_SITE_OTHER): Payer: Medicare Other | Admitting: Internal Medicine

## 2019-01-17 DIAGNOSIS — Z6825 Body mass index (BMI) 25.0-25.9, adult: Secondary | ICD-10-CM | POA: Diagnosis not present

## 2019-01-17 DIAGNOSIS — M545 Low back pain: Secondary | ICD-10-CM | POA: Diagnosis not present

## 2019-02-10 DIAGNOSIS — Z6825 Body mass index (BMI) 25.0-25.9, adult: Secondary | ICD-10-CM | POA: Diagnosis not present

## 2019-02-10 DIAGNOSIS — M545 Low back pain: Secondary | ICD-10-CM | POA: Diagnosis not present

## 2019-02-10 DIAGNOSIS — S335XXA Sprain of ligaments of lumbar spine, initial encounter: Secondary | ICD-10-CM | POA: Diagnosis not present

## 2019-04-18 DIAGNOSIS — Z23 Encounter for immunization: Secondary | ICD-10-CM | POA: Diagnosis not present

## 2019-04-20 DIAGNOSIS — R7301 Impaired fasting glucose: Secondary | ICD-10-CM | POA: Diagnosis not present

## 2019-04-20 DIAGNOSIS — I10 Essential (primary) hypertension: Secondary | ICD-10-CM | POA: Diagnosis not present

## 2019-04-20 DIAGNOSIS — E7801 Familial hypercholesterolemia: Secondary | ICD-10-CM | POA: Diagnosis not present

## 2019-04-20 DIAGNOSIS — R5382 Chronic fatigue, unspecified: Secondary | ICD-10-CM | POA: Diagnosis not present

## 2019-04-20 DIAGNOSIS — E782 Mixed hyperlipidemia: Secondary | ICD-10-CM | POA: Diagnosis not present

## 2019-04-20 DIAGNOSIS — E039 Hypothyroidism, unspecified: Secondary | ICD-10-CM | POA: Diagnosis not present

## 2019-04-26 DIAGNOSIS — F3342 Major depressive disorder, recurrent, in full remission: Secondary | ICD-10-CM | POA: Diagnosis not present

## 2019-04-26 DIAGNOSIS — I10 Essential (primary) hypertension: Secondary | ICD-10-CM | POA: Diagnosis not present

## 2019-04-26 DIAGNOSIS — E782 Mixed hyperlipidemia: Secondary | ICD-10-CM | POA: Diagnosis not present

## 2019-04-26 DIAGNOSIS — M0609 Rheumatoid arthritis without rheumatoid factor, multiple sites: Secondary | ICD-10-CM | POA: Diagnosis not present

## 2019-04-26 DIAGNOSIS — Z6825 Body mass index (BMI) 25.0-25.9, adult: Secondary | ICD-10-CM | POA: Diagnosis not present

## 2019-04-26 DIAGNOSIS — G629 Polyneuropathy, unspecified: Secondary | ICD-10-CM | POA: Diagnosis not present

## 2019-04-26 DIAGNOSIS — M109 Gout, unspecified: Secondary | ICD-10-CM | POA: Diagnosis not present

## 2019-04-26 DIAGNOSIS — E039 Hypothyroidism, unspecified: Secondary | ICD-10-CM | POA: Diagnosis not present

## 2019-05-01 NOTE — Progress Notes (Signed)
Subjective:    [name], female    DOB: 1936/02/04, 83 y.o.   MRN: [id number]  HPI [name]. Rementer is an 83 year old female with a past medical history of anxiety, depression, hypertension, rheumatoid arthritis, diverticulitis, GERD and achalasia s/p pneumatic dilation at [facility] in 1994.  She has been PPI's since 2018. She presents today for her year follow up. She has dysphagia when she is stressed.  She reports having food that gets stuck to her upper esophagus, she typically drinks water and the food washes down.  However, 2 weeks ago he had an episode when food got stuck and she gagged, leaned over the sink to expel the food.  She denies having any heartburn or stomach pain.  She denies having any lower abdominal pain.  She is taking Mobic 7.5 mg as needed, typically takes once daily for 3 to 4 days then does not take for a week at a time.  She is taking a probiotic daily which keeps her bowel movements regular.  She is passing normal formed bowel movement once or twice daily.  No rectal bleeding or black stools.  No other complaints today.  EGD 10/25/2014: EGD findings; Somewhat dilated esophageal body otherwise normal EGD.  Colonoscopy 10/25/2014:  Examination performed to cecum. Multiple diverticula at sigmoid colon. Small hyperplastic polyp cold snared from distal sigmoid colon. Focal scarring above the dentate line from previous hemorrhoidectomy.  Past Medical History:  Diagnosis Date  . Abdominal pain   . Allergic rhinitis   . Anxiety 10/03/2013  . Arthralgia 03/31/2014  . Bronchitis 09/09/2013  . Cellulitis   . Diverticulitis   . Diverticulosis   . Fatigue   . GERD (gastroesophageal reflux disease)   . Gout   . Headache   . Headache   . Hyperchloremia   . Hypercholesterolemia   . Hypertension   . Hypothyroidism   . Lumbar strain   . Major depressive disorder   . Migraine   . Osteoporosis   . Rheumatoid arthritis [facility])    Past Surgical History:   Procedure Laterality Date  . CARPAL TUNNEL RELEASE     25 years ago  . CHOLECYSTECTOMY     20 yrs ago  . COLONOSCOPY    . COLONOSCOPY N/A 10/25/2014   Procedure: COLONOSCOPY;  Surgeon: Rogene Houston, MD;  [facility];  Service: Endoscopy;  Laterality: N/A;  830  . ESOPHAGOGASTRODUODENOSCOPY N/A 10/25/2014   Procedure: ESOPHAGOGASTRODUODENOSCOPY (EGD);  Surgeon: Rogene Houston, MD;  [facility];  Service: Endoscopy;  Laterality: N/A;  . HEMORROIDECTOMY     2005  . POLYPECTOMY    . TOTAL ABDOMINAL HYSTERECTOMY     Age 26  . UPPER GASTROINTESTINAL ENDOSCOPY     Current Outpatient Medications on File Prior to Visit  Medication Sig Dispense Refill  . Calcium Carb-Cholecalciferol (CALCIUM 500 +D PO) Take 2 tablets by mouth daily.    . citalopram (CELEXA) 20 MG tablet Take 20 mg by mouth daily.    . Cyanocobalamin (VITAMIN B 12 PO) Take by mouth daily.    . fexofenadine (ALLEGRA) 180 MG tablet Take 180 mg by mouth as needed for allergies or rhinitis.    Marland Kitchen FLUTICASONE PROPIONATE, NASAL, NA Place 50 mcg into the nose as needed.    Marland Kitchen levothyroxine (SYNTHROID, LEVOTHROID) 100 MCG tablet Take 100 mcg by mouth daily before breakfast.    . meloxicam (MOBIC) 7.5 MG tablet Take 7.5 mg by mouth  as needed for pain.    . Multiple Vitamins-Minerals (PRESERVISION/LUTEIN PO) Take by mouth daily.    . pimecrolimus (ELIDEL) 1 % cream Apply 1 application topically daily.    . Probiotic Product (PROBIOTIC DAILY PO) Take by mouth daily. Sometimes she will take twice a day.    . traMADol (ULTRAM) 50 MG tablet Take 1 tablet (50 mg total) by mouth every 6 (six) hours as needed. 45 tablet 5  . verapamil (CALAN-SR) 120 MG CR tablet Take 1 tablet (120 mg total) by mouth at bedtime. 30 tablet 11  . OVER THE COUNTER MEDICATION Raw Vinegar (with Mother) Patient states that she takes 1 teaspoon three times daily ,before meals: for Gerd,digestive problems     No current facility-administered  medications on file prior to visit.    Allergies  Allergen Reactions  . Ibuprofen Other (See Comments)    heartburn   Review of Systems see HPI, all other systems reviewed and are negative     Objective:   Physical Exam BP (!) 179/84   Pulse 75   Temp 98.3 F (36.8 C) (Oral)   Ht 5\' 6"  (1.676 m)   Wt 157 lb 9.6 oz (71.5 kg)   BMI 25.44 kg/m  General: 83 year old female well-developed in no acute distress Eyes: Sclera nonicteric, conjunctiva pink Mouth: Few missing dentition, no ulcers or lesions Neck: Supple, no lymphadenopathy or thyromegaly Heart: Regular rate and rhythm, 1/6 systolic murmur Lungs: Breath sounds clear throughout Abdomen: Soft, nontender, no masses or organomegaly, positive bowel sounds all 4 quadrants, lower midline abdominal scar intact Extremities: No edema Neuro: Alert and oriented x4, no focal deficits    Assessment & Plan:   1. Achalasia with dysphagia  -Schedule EGD with dilatation, benefits and risks discussed including risk with sedation, risk of perforation, bleeding and infection -request copy of most recent CBC, CMP and PCPs office -Patient will call office if her symptoms worsen  2. GERD.  No active heartburn.  Patient is not taking a PPI.  3.  History of colon polyps.  Last colonoscopy in 2016 1 hyperplastic polyp removed.  -No plans for further colonoscopies as patient is 23 unless medically necessary  Further follow-up to be determined after EGD completed

## 2019-05-03 ENCOUNTER — Ambulatory Visit (INDEPENDENT_AMBULATORY_CARE_PROVIDER_SITE_OTHER): Payer: Medicare Other | Admitting: Nurse Practitioner

## 2019-05-03 ENCOUNTER — Encounter (INDEPENDENT_AMBULATORY_CARE_PROVIDER_SITE_OTHER): Payer: Self-pay | Admitting: *Deleted

## 2019-05-03 ENCOUNTER — Other Ambulatory Visit: Payer: Self-pay

## 2019-05-03 ENCOUNTER — Encounter (INDEPENDENT_AMBULATORY_CARE_PROVIDER_SITE_OTHER): Payer: Self-pay | Admitting: Nurse Practitioner

## 2019-05-03 VITALS — BP 179/84 | HR 75 | Temp 98.3°F | Ht 66.0 in | Wt 157.6 lb

## 2019-05-03 DIAGNOSIS — K220 Achalasia of cardia: Secondary | ICD-10-CM | POA: Diagnosis not present

## 2019-05-03 DIAGNOSIS — R1310 Dysphagia, unspecified: Secondary | ICD-10-CM | POA: Diagnosis not present

## 2019-05-03 NOTE — Patient Instructions (Signed)
1. Schedule an EGD with esophagea dilatation  2. Call our office if your swallowing difficulties worsen

## 2019-05-04 DIAGNOSIS — I10 Essential (primary) hypertension: Secondary | ICD-10-CM | POA: Diagnosis not present

## 2019-05-04 DIAGNOSIS — E785 Hyperlipidemia, unspecified: Secondary | ICD-10-CM | POA: Diagnosis not present

## 2019-05-10 DIAGNOSIS — I10 Essential (primary) hypertension: Secondary | ICD-10-CM | POA: Diagnosis not present

## 2019-06-03 DIAGNOSIS — I10 Essential (primary) hypertension: Secondary | ICD-10-CM | POA: Diagnosis not present

## 2019-06-03 DIAGNOSIS — M109 Gout, unspecified: Secondary | ICD-10-CM | POA: Diagnosis not present

## 2019-06-07 DIAGNOSIS — I10 Essential (primary) hypertension: Secondary | ICD-10-CM | POA: Diagnosis not present

## 2019-06-07 DIAGNOSIS — Z6824 Body mass index (BMI) 24.0-24.9, adult: Secondary | ICD-10-CM | POA: Diagnosis not present

## 2019-06-15 ENCOUNTER — Other Ambulatory Visit: Payer: Self-pay

## 2019-06-15 ENCOUNTER — Encounter (HOSPITAL_COMMUNITY)
Admission: RE | Admit: 2019-06-15 | Discharge: 2019-06-15 | Disposition: A | Discharge status: Home / Self Care | Payer: Medicare Other | Source: Ambulatory Visit | Attending: Internal Medicine | Admitting: Internal Medicine

## 2019-06-15 ENCOUNTER — Other Ambulatory Visit (HOSPITAL_COMMUNITY)
Admission: RE | Admit: 2019-06-15 | Discharge: 2019-06-15 | Disposition: A | Discharge status: Home / Self Care | Payer: Medicare Other | Source: Ambulatory Visit | Attending: Internal Medicine | Admitting: Internal Medicine

## 2019-06-15 DIAGNOSIS — Z01812 Encounter for preprocedural laboratory examination: Secondary | ICD-10-CM | POA: Diagnosis not present

## 2019-06-15 DIAGNOSIS — Z20828 Contact with and (suspected) exposure to other viral communicable diseases: Secondary | ICD-10-CM | POA: Diagnosis not present

## 2019-06-15 LAB — SARS CORONAVIRUS 2 (TAT 6-24 HRS): SARS Coronavirus 2: NEGATIVE

## 2019-06-17 ENCOUNTER — Ambulatory Visit (HOSPITAL_COMMUNITY)
Admission: RE | Admit: 2019-06-17 | Discharge: 2019-06-17 | Disposition: A | Discharge status: Home / Self Care | Payer: Medicare Other | Attending: Internal Medicine | Admitting: Internal Medicine

## 2019-06-17 ENCOUNTER — Encounter (HOSPITAL_COMMUNITY): Payer: Self-pay | Admitting: *Deleted

## 2019-06-17 ENCOUNTER — Ambulatory Visit (HOSPITAL_COMMUNITY): Payer: Medicare Other | Admitting: Anesthesiology

## 2019-06-17 ENCOUNTER — Encounter (HOSPITAL_COMMUNITY)
Admission: RE | Disposition: A | Discharge status: Home / Self Care | Payer: Self-pay | Source: Home / Self Care | Attending: Internal Medicine

## 2019-06-17 DIAGNOSIS — Z791 Long term (current) use of non-steroidal anti-inflammatories (NSAID): Secondary | ICD-10-CM | POA: Diagnosis not present

## 2019-06-17 DIAGNOSIS — F329 Major depressive disorder, single episode, unspecified: Secondary | ICD-10-CM | POA: Diagnosis not present

## 2019-06-17 DIAGNOSIS — F419 Anxiety disorder, unspecified: Secondary | ICD-10-CM | POA: Insufficient documentation

## 2019-06-17 DIAGNOSIS — Z7982 Long term (current) use of aspirin: Secondary | ICD-10-CM | POA: Insufficient documentation

## 2019-06-17 DIAGNOSIS — R1314 Dysphagia, pharyngoesophageal phase: Secondary | ICD-10-CM | POA: Diagnosis not present

## 2019-06-17 DIAGNOSIS — K2100 Gastro-esophageal reflux disease with esophagitis, without bleeding: Secondary | ICD-10-CM | POA: Diagnosis not present

## 2019-06-17 DIAGNOSIS — E039 Hypothyroidism, unspecified: Secondary | ICD-10-CM | POA: Insufficient documentation

## 2019-06-17 DIAGNOSIS — M069 Rheumatoid arthritis, unspecified: Secondary | ICD-10-CM | POA: Insufficient documentation

## 2019-06-17 DIAGNOSIS — R233 Spontaneous ecchymoses: Secondary | ICD-10-CM | POA: Insufficient documentation

## 2019-06-17 DIAGNOSIS — E7800 Pure hypercholesterolemia, unspecified: Secondary | ICD-10-CM | POA: Insufficient documentation

## 2019-06-17 DIAGNOSIS — Z79890 Hormone replacement therapy (postmenopausal): Secondary | ICD-10-CM | POA: Insufficient documentation

## 2019-06-17 DIAGNOSIS — K228 Other specified diseases of esophagus: Secondary | ICD-10-CM | POA: Diagnosis not present

## 2019-06-17 DIAGNOSIS — R1310 Dysphagia, unspecified: Secondary | ICD-10-CM

## 2019-06-17 DIAGNOSIS — I10 Essential (primary) hypertension: Secondary | ICD-10-CM | POA: Insufficient documentation

## 2019-06-17 DIAGNOSIS — K220 Achalasia of cardia: Secondary | ICD-10-CM | POA: Diagnosis not present

## 2019-06-17 DIAGNOSIS — Z79899 Other long term (current) drug therapy: Secondary | ICD-10-CM | POA: Diagnosis not present

## 2019-06-17 DIAGNOSIS — Z87891 Personal history of nicotine dependence: Secondary | ICD-10-CM | POA: Insufficient documentation

## 2019-06-17 DIAGNOSIS — K3189 Other diseases of stomach and duodenum: Secondary | ICD-10-CM

## 2019-06-17 HISTORY — PX: ESOPHAGOGASTRODUODENOSCOPY (EGD) WITH PROPOFOL: SHX5813

## 2019-06-17 HISTORY — PX: ESOPHAGEAL DILATION: SHX303

## 2019-06-17 SURGERY — ESOPHAGOGASTRODUODENOSCOPY (EGD) WITH PROPOFOL
Anesthesia: General

## 2019-06-17 MED ORDER — HYDROCODONE-ACETAMINOPHEN 7.5-325 MG PO TABS: 1.0000 | ORAL_TABLET | Freq: Once | ORAL | Status: DC | PRN

## 2019-06-17 MED ORDER — KETAMINE HCL 50 MG/5ML IJ SOSY
PREFILLED_SYRINGE | INTRAMUSCULAR | Status: AC
  Filled 2019-06-17: qty 5

## 2019-06-17 MED ORDER — PROMETHAZINE HCL 25 MG/ML IJ SOLN: 6.2500 mg | INTRAMUSCULAR | Status: DC | PRN

## 2019-06-17 MED ORDER — PROPOFOL 10 MG/ML IV BOLUS
INTRAVENOUS | Status: DC | PRN
  Administered 2019-06-17: 20 mg via INTRAVENOUS

## 2019-06-17 MED ORDER — KETAMINE HCL 10 MG/ML IJ SOLN
INTRAMUSCULAR | Status: DC | PRN
  Administered 2019-06-17: 30 mg via INTRAVENOUS

## 2019-06-17 MED ORDER — LACTATED RINGERS IV SOLN
INTRAVENOUS | Status: DC
  Administered 2019-06-17: 1000 mL via INTRAVENOUS

## 2019-06-17 MED ORDER — GLYCOPYRROLATE 0.2 MG/ML IJ SOLN
INTRAMUSCULAR | Status: DC | PRN
  Administered 2019-06-17: 0.1 mg via INTRAVENOUS

## 2019-06-17 MED ORDER — CHLORHEXIDINE GLUCONATE CLOTH 2 % EX PADS: 6.0000 | MEDICATED_PAD | Freq: Once | CUTANEOUS | Status: DC

## 2019-06-17 MED ORDER — ARTIFICIAL TEARS OPHTHALMIC OINT
TOPICAL_OINTMENT | Freq: Once | OPHTHALMIC | Status: AC
  Administered 2019-06-17: 14:00:00 via OPHTHALMIC
  Filled 2019-06-17: qty 3.5

## 2019-06-17 MED ORDER — MIDAZOLAM HCL 2 MG/2ML IJ SOLN: 0.5000 mg | Freq: Once | INTRAMUSCULAR | Status: DC | PRN

## 2019-06-17 MED ORDER — SODIUM CHLORIDE FLUSH 0.9 % IV SOLN
INTRAVENOUS | Status: AC
  Filled 2019-06-17: qty 10

## 2019-06-17 MED ORDER — PROPOFOL 500 MG/50ML IV EMUL
INTRAVENOUS | Status: DC | PRN
  Administered 2019-06-17: 75 ug/kg/min via INTRAVENOUS

## 2019-06-17 MED ORDER — LIDOCAINE HCL (CARDIAC) PF 100 MG/5ML IV SOSY
PREFILLED_SYRINGE | INTRAVENOUS | Status: DC | PRN
  Administered 2019-06-17: 50 mg via INTRATRACHEAL

## 2019-06-17 MED ORDER — FAMOTIDINE 40 MG PO TABS: 40.0000 mg | ORAL_TABLET | Freq: Every day | ORAL | 3 refills | Status: DC

## 2019-06-17 MED ORDER — HYDROMORPHONE HCL 1 MG/ML IJ SOLN: 0.2500 mg | INTRAMUSCULAR | Status: DC | PRN

## 2019-06-17 MED ORDER — PROPOFOL 10 MG/ML IV BOLUS
INTRAVENOUS | Status: AC
  Filled 2019-06-17: qty 60

## 2019-06-17 MED ORDER — GLYCOPYRROLATE PF 0.2 MG/ML IJ SOSY
PREFILLED_SYRINGE | INTRAMUSCULAR | Status: AC
  Filled 2019-06-17: qty 1

## 2019-06-17 NOTE — Progress Notes (Signed)
From EGD. C/O left eye pain. Left eye red. No swelling. No drainage. Encouraged no rubbing of eye. Dr Hilaria Ota at bedside to check pt. Order given for lacri lube and cover with patch.

## 2019-06-17 NOTE — Anesthesia Preprocedure Evaluation (Signed)
Anesthesia Evaluation  Patient identified by MRN, date of birth, ID band Patient awake    Reviewed: Allergy & Precautions, NPO status , Patient's Chart, lab work & pertinent test results  Airway Mallampati: III  TM Distance: >3 FB Neck ROM: Full    Dental no notable dental hx. (+) Teeth Intact   Pulmonary neg pulmonary ROS, former smoker,    Pulmonary exam normal breath sounds clear to auscultation       Cardiovascular Exercise Tolerance: Good hypertension, Pt. on medications negative cardio ROS Normal cardiovascular examI Rhythm:Regular Rate:Normal     Neuro/Psych  Headaches, PSYCHIATRIC DISORDERS Anxiety Depression  Neuromuscular disease    GI/Hepatic Neg liver ROS, GERD  Medicated and Controlled,Here for EGD /dilation  Denies GERD Sx today    Endo/Other  Hypothyroidism   Renal/GU negative Renal ROS  negative genitourinary   Musculoskeletal  (+) Arthritis , Rheumatoid disorders,    Abdominal   Peds negative pediatric ROS (+)  Hematology negative hematology ROS (+)   Anesthesia Other Findings   Reproductive/Obstetrics negative OB ROS                             Anesthesia Physical Anesthesia Plan  ASA: II  Anesthesia Plan: General   Post-op Pain Management:    Induction: Intravenous  PONV Risk Score and Plan: 3 and TIVA, Propofol infusion, Ondansetron and Treatment may vary due to age or medical condition  Airway Management Planned: Simple Face Mask and Nasal Cannula  Additional Equipment:   Intra-op Plan:   Post-operative Plan:   Informed Consent: I have reviewed the patients History and Physical, chart, labs and discussed the procedure including the risks, benefits and alternatives for the proposed anesthesia with the patient or authorized representative who has indicated his/her understanding and acceptance.     Dental advisory given  Plan Discussed with:  CRNA  Anesthesia Plan Comments: (Plan Full PPE use  Plan GA with GETA as needed d/w pt -WTP with same after Q&A)        Anesthesia Quick Evaluation

## 2019-06-17 NOTE — Op Note (Signed)
[facility] Patient Name: Megan Daniel Procedure Date: 06/17/2019 12:52 PM MRN: [id number] Date of Birth: [birth year] Attending MD: Hildred Laser , MD CSN: [id number] Age: 83 Admit Type: Outpatient Procedure:                Upper GI endoscopy Indications:              Esophageal dysphagia, Achalasia Providers:                Hildred Laser, MD, Otis Peak B. Sharon Seller, RN, Raphael Gibney, Technician Referring MD:             Manon Hilding, MD Medicines:                Propofol per Anesthesia Complications:            No immediate complications. Estimated Blood Loss:     Estimated blood loss was minimal. Procedure:                Pre-Anesthesia Assessment:                           - Prior to the procedure, a History and Physical                            was performed, and patient medications and                            allergies were reviewed. The patient's tolerance of                            previous anesthesia was also reviewed. The risks                            and benefits of the procedure and the sedation                            options and risks were discussed with the patient.                            All questions were answered, and informed consent                            was obtained. Prior Anticoagulants: The patient has                            taken no previous anticoagulant or antiplatelet                            agents except for aspirin and has taken no previous                            anticoagulant or antiplatelet agents except for  NSAID medication. ASA Grade Assessment: II - A                            patient with mild systemic disease. After reviewing                            the risks and benefits, the patient was deemed in                            satisfactory condition to undergo the procedure.                           After obtaining informed consent, the endoscope was                             passed under direct vision. Throughout the                            procedure, the patient's blood pressure, pulse, and                            oxygen saturations were monitored continuously. The                            [id number]) was introduced through the                            mouth, and advanced to the second part of duodenum.                            The upper GI endoscopy was accomplished without                            difficulty. The patient tolerated the procedure                            well. Scope In: 1:24:30 PM Scope Out: 1:34:36 PM Total Procedure Duration: 0 hours 10 minutes 6 seconds  Findings:      Normal mucosa was found in the proximal esophagus and in the mid       esophagus.      LA Grade A (one or more mucosal breaks less than 5 mm, not extending       between tops of 2 mucosal folds) esophagitis was found 38 cm from the       incisors.      The lumen of the middle third of the esophagus was mildly dilated.      A few petechiae were found in the gastric antrum.      The exam of the stomach was otherwise normal.      The duodenal bulb and second portion of the duodenum were normal.      No endoscopic abnormality was evident in the esophagus to explain the       patient's complaint of dysphagia. It was decided, however, to proceed       with dilation at the gastroesophageal  junction. A TTS dilator was passed       through the scope. Dilation with an 18-19-20 mm balloon dilator was       performed to 18 mm, 19 mm and 20 mm. The dilation site was examined and       showed no change, no mucosal tear and no perforation. Impression:               - Normal mucosa was found in the proximal esophagus                            and mid esophagus.                           - LA Grade A reflux esophagitis.                           - Dilation in the middle third of the esophagus.                           - Gastric petechia(e).                            - Normal duodenal bulb and second portion of the                            duodenum.                           - No endoscopic esophageal abnormality to explain                            patient's dysphagia. Distal esophagus/ GEJ dilated.                           - No specimens collected. Moderate Sedation:      Per Anesthesia Care Recommendation:           - Patient has a contact number available for                            emergencies. The signs and symptoms of potential                            delayed complications were discussed with the                            patient. Return to normal activities tomorrow.                            Written discharge instructions were provided to the                            patient.                           - Resume previous diet today.                           -  Continue present medications.                           - No aspirin, ibuprofen, naproxen, or other                            non-steroidal anti-inflammatory drugs for 1 day.                           - Use Pepcid (famotidine) 40 mg PO qhs..                           - Telephone GI clinic in 1 week.                           - Return to GI clinic in 1 year. Procedure Code(s):        --- Professional ---                           [phone], Esophagogastroduodenoscopy, flexible,                            transoral; with transendoscopic balloon dilation of                            esophagus (less than 30 mm diameter) Diagnosis Code(s):        --- Professional ---                           K21.00, Gastro-esophageal reflux disease with                            esophagitis, without bleeding                           K22.8, Other specified diseases of esophagus                           K31.89, Other diseases of stomach and duodenum                           R13.14, Dysphagia, pharyngoesophageal phase                           K22.0, Achalasia of  cardia CPT copyright 2019 American [employer] Association. All rights reserved. The codes documented in this report are preliminary and upon coder review may  be revised to meet current compliance requirements. Hildred Laser, MD Hildred Laser, MD 06/17/2019 1:49:40 PM This report has been signed electronically. Number of Addenda: 0

## 2019-06-17 NOTE — Anesthesia Postprocedure Evaluation (Signed)
Anesthesia Post Note  Patient: Megan Daniel  Procedure(s) Performed: ESOPHAGOGASTRODUODENOSCOPY (EGD) WITH PROPOFOL (N/A ) ESOPHAGEAL DILATION (N/A )  Patient location during evaluation: PACU Anesthesia Type: General Level of consciousness: awake and alert Pain management: pain level controlled Vital Signs Assessment: post-procedure vital signs reviewed and stable Respiratory status: spontaneous breathing, nonlabored ventilation and respiratory function stable Cardiovascular status: stable Postop Assessment: no apparent nausea or vomiting Anesthetic complications: no     Last Vitals:  Vitals:   06/17/19 1243 06/17/19 1302  BP: (!) 190/77 (!) 166/78  Pulse: 62   Resp: 20   Temp: 36.4 C   SpO2: 97%     Last Pain:  Vitals:   06/17/19 1318  TempSrc:   PainSc: 0-No pain                 Xana Bradt Hristova

## 2019-06-17 NOTE — Discharge Instructions (Signed)
Resume aspirin on 06/18/2019. Resume other medications as before. Famotidine 40 mg by mouth daily at bedtime. Resume usual diet. No driving for 24 hours. Please call office with progress report next week. Office visit in 1 year.     Upper Endoscopy, Adult, Care After This sheet gives you information about how to care for yourself after your procedure. Your health care provider may also give you more specific instructions. If you have problems or questions, contact your health care provider. What can I expect after the procedure? After the procedure, it is common to have:  A sore throat.  Mild stomach pain or discomfort.  Bloating.  Nausea. Follow these instructions at home:   Follow instructions from your health care provider about what to eat or drink after your procedure.  Return to your normal activities as told by your health care provider. Ask your health care provider what activities are safe for you.  Take over-the-counter and prescription medicines only as told by your health care provider.  Do not drive for 24 hours if you were given a sedative during your procedure.  Keep all follow-up visits as told by your health care provider. This is important. Contact a health care provider if you have:  A sore throat that lasts longer than one day.  Trouble swallowing. Get help right away if:  You vomit blood or your vomit looks like coffee grounds.  You have: ? A fever. ? Bloody, black, or tarry stools. ? A severe sore throat or you cannot swallow. ? Difficulty breathing. ? Severe pain in your chest or abdomen. Summary  After the procedure, it is common to have a sore throat, mild stomach discomfort, bloating, and nausea.  Do not drive for 24 hours if you were given a sedative during the procedure.  Follow instructions from your health care provider about what to eat or drink after your procedure.  Return to your normal activities as told by your health care  provider. This information is not intended to replace advice given to you by your health care provider. Make sure you discuss any questions you have with your health care provider. Document Released: 01/20/2012 Document Revised: 01/12/2018 Document Reviewed: 12/21/2017 Elsevier Patient Education  2020 [place] After These instructions provide you with information about caring for yourself after your procedure. Your health care provider may also give you more specific instructions. Your treatment has been planned according to current medical practices, but problems sometimes occur. Call your health care provider if you have any problems or questions after your procedure. What can I expect after the procedure? After your procedure, you may:  Feel sleepy for several hours.  Feel clumsy and have poor balance for several hours.  Feel forgetful about what happened after the procedure.  Have poor judgment for several hours.  Feel nauseous or vomit.  Have a sore throat if you had a breathing tube during the procedure. Follow these instructions at home: For at least 24 hours after the procedure:      Have a responsible adult stay with you. It is important to have someone help care for you until you are awake and alert.  Rest as needed.  Do not: ? Participate in activities in which you could fall or become injured. ? Drive. ? Use heavy machinery. ? Drink alcohol. ? Take sleeping pills or medicines that cause drowsiness. ? Make important decisions or sign legal documents. ? Take care of children  on your own. Eating and drinking  Follow the diet that is recommended by your health care provider.  If you vomit, drink water, juice, or soup when you can drink without vomiting.  Make sure you have little or no nausea before eating solid foods. General instructions  Take over-the-counter and prescription medicines only as told by your health  care provider.  If you have sleep apnea, surgery and certain medicines can increase your risk for breathing problems. Follow instructions from your health care provider about wearing your sleep device: ? Anytime you are sleeping, including during daytime naps. ? While taking prescription pain medicines, sleeping medicines, or medicines that make you drowsy.  If you smoke, do not smoke without supervision.  Keep all follow-up visits as told by your health care provider. This is important. Contact a health care provider if:  You keep feeling nauseous or you keep vomiting.  You feel light-headed.  You develop a rash.  You have a fever. Get help right away if:  You have trouble breathing. Summary  For several hours after your procedure, you may feel sleepy and have poor judgment.  Have a responsible adult stay with you for at least 24 hours or until you are awake and alert. This information is not intended to replace advice given to you by your health care provider. Make sure you discuss any questions you have with your health care provider. Document Released: 11/11/2015 Document Revised: 10/19/2017 Document Reviewed: 11/11/2015 Elsevier Patient Education  2020 [employer].

## 2019-06-17 NOTE — Transfer of Care (Signed)
Immediate Anesthesia Transfer of Care Note  Patient: Megan Daniel  Procedure(s) Performed: ESOPHAGOGASTRODUODENOSCOPY (EGD) WITH PROPOFOL (N/A ) ESOPHAGEAL DILATION (N/A )  Patient Location: PACU  Anesthesia Type:General  Level of Consciousness: awake, alert  and oriented  Airway & Oxygen Therapy: Patient Spontanous Breathing  Post-op Assessment: Report given to RN and Post -op Vital signs reviewed and stable  Post vital signs: Reviewed and stable  Last Vitals:  Vitals Value Taken Time  BP 127/63 06/17/19 1342  Temp    Pulse 59 06/17/19 1344  Resp 17 06/17/19 1344  SpO2 98 % 06/17/19 1344  Vitals shown include unvalidated device data.  Last Pain:  Vitals:   06/17/19 1318  TempSrc:   PainSc: 0-No pain      Patients Stated Pain Goal: 8 ([id number])  Complications: No apparent anesthesia complications

## 2019-06-17 NOTE — Progress Notes (Signed)
lacri-lube applied to left eye. 2"x2" gauze and eyeshield applied. States "that feels better."

## 2019-06-17 NOTE — H&P (Signed)
Megan Daniel is an 83 y.o. female.   Chief Complaint: Patient is here for esophagogastroduodenoscopy and esophageal dilation. HPI: Patient is 83 year old Caucasian female who has history of achalasia and underwent pneumatic dilation in 1994.  Her last EGD was in March 2016.  She now presents with intermittent dysphagia to solids.  She points to mid/upper sternal area site of bolus obstruction.  She has heartburn but not often.  She watches her diet.  She does not eat late.  She does not experience nocturnal regurgitation.  She states she has not been active because of Covid-19 pandemic and had gained few pounds.  She has managed to lose 7 pounds by watching her diet.  She denies abdominal pain melena or rectal bleeding. Says she has been undergoing dental work-up.  She developed an abscess in was treated with amoxicillin.  She took last dose yesterday.  She has no symptoms pertaining to her teeth today.  Past Medical History:  Diagnosis Date  . Abdominal pain   . Allergic rhinitis   . Anxiety 10/03/2013  . Arthralgia 03/31/2014  . Bronchitis 09/09/2013  . Cellulitis   . Diverticulitis   . Diverticulosis   . Fatigue   . GERD (gastroesophageal reflux disease)   . Gout   . Headache   . Headache   . Hyperchloremia   . Hypercholesterolemia   . Hypertension   . Hypothyroidism   . Lumbar strain   . Major depressive disorder   . Migraine   . Osteoporosis   . Rheumatoid arthritis [facility])     Past Surgical History:  Procedure Laterality Date  . CARPAL TUNNEL RELEASE     25 years ago  . CHOLECYSTECTOMY     20 yrs ago  . COLONOSCOPY    . COLONOSCOPY N/A 10/25/2014   Procedure: COLONOSCOPY;  Surgeon: Rogene Houston, MD;  Location: [facility];  Service: Endoscopy;  Laterality: N/A;  830  . ESOPHAGOGASTRODUODENOSCOPY N/A 10/25/2014   Procedure: ESOPHAGOGASTRODUODENOSCOPY (EGD);  Surgeon: Rogene Houston, MD;  Location: [facility];  Service: Endoscopy;  Laterality: N/A;  .  HEMORROIDECTOMY     2005  . POLYPECTOMY    . TOTAL ABDOMINAL HYSTERECTOMY     Age 38  . UPPER GASTROINTESTINAL ENDOSCOPY      Family History  Problem Relation Age of Onset  . Alzheimer's disease Father   . Alzheimer's disease Sister   . Macular degeneration Sister   . Healthy Brother   . Arthritis Sister   . Arthritis Sister   . COPD Sister   . Heart disease Mother   . Seizures Sister   . Seizures Other        nephew  . Liver cancer Other   . Alcohol abuse Sister    Social History:  reports that she quit smoking about 2 years ago. Her smoking use included cigarettes. She smoked 0.50 packs per day. She has never used smokeless tobacco. She reports current alcohol use. She reports that she does not use drugs.  Allergies:  Allergies  Allergen Reactions  . Ibuprofen Other (See Comments)    heartburn    Medications Prior to Admission  Medication Sig Dispense Refill  . amoxicillin (AMOXIL) 500 MG capsule Take 500 mg by mouth 3 (three) times daily.    Marland Kitchen aspirin EC 81 MG tablet Take 81 mg by mouth daily.    . citalopram (CELEXA) 20 MG tablet Take 20 mg by mouth daily.    . fexofenadine (ALLEGRA) 180  MG tablet Take 180 mg by mouth as needed for allergies or rhinitis.    Marland Kitchen levothyroxine (SYNTHROID, LEVOTHROID) 100 MCG tablet Take 100 mcg by mouth daily before breakfast.    . meloxicam (MOBIC) 15 MG tablet Take 15 mg by mouth daily as needed for pain.     . Multiple Vitamins-Minerals (PRESERVISION/LUTEIN PO) Take 1 tablet by mouth daily.     . pimecrolimus (ELIDEL) 1 % cream Apply 1 application topically daily as needed (itching ear canal).     . Probiotic Product (PROBIOTIC DAILY PO) Take 1 tablet by mouth daily. Sometimes she will take twice a day.     . traMADol (ULTRAM) 50 MG tablet Take 1 tablet (50 mg total) by mouth every 6 (six) hours as needed. (Patient taking differently: Take 50 mg by mouth every 6 (six) hours as needed for moderate pain. ) 45 tablet 5  . verapamil  (CALAN-SR) 240 MG CR tablet Take 240 mg by mouth daily.    . verapamil (CALAN-SR) 120 MG CR tablet Take 1 tablet (120 mg total) by mouth at bedtime. (Patient not taking: Reported on 06/08/2019) 30 tablet 11    No results found for this or any previous visit (from the past 48 hour(s)). No results found.  ROS  Blood pressure (!) 166/78, pulse 62, temperature 97.6 F (36.4 C), temperature source Oral, resp. rate 20, SpO2 97 %. Physical Exam  Constitutional: She appears well-developed and well-nourished.  HENT:  Mouth/Throat: Oropharynx is clear and moist.  Eyes: Conjunctivae are normal. No scleral icterus.  Neck: No thyromegaly present.  Cardiovascular: Normal rate, regular rhythm and normal heart sounds.  No murmur heard. Respiratory: Effort normal and breath sounds normal.  GI: Soft. She exhibits no distension and no mass. There is no abdominal tenderness.  Musculoskeletal:        General: No edema.  Lymphadenopathy:    She has no cervical adenopathy.  Neurological: She is alert.  Skin: Skin is warm and dry.     Assessment/Plan Esophageal dysphagia in a patient with known history of achalasia status post remote pneumatic [id number]). Esophagogastroduodenoscopy with esophageal dilation.  Hildred Laser, MD 06/17/2019, 1:10 PM

## 2019-06-22 ENCOUNTER — Encounter (HOSPITAL_COMMUNITY): Payer: Self-pay | Admitting: Internal Medicine

## 2019-07-04 DIAGNOSIS — E782 Mixed hyperlipidemia: Secondary | ICD-10-CM | POA: Diagnosis not present

## 2019-07-04 DIAGNOSIS — I10 Essential (primary) hypertension: Secondary | ICD-10-CM | POA: Diagnosis not present

## 2019-07-08 DIAGNOSIS — Z6824 Body mass index (BMI) 24.0-24.9, adult: Secondary | ICD-10-CM | POA: Diagnosis not present

## 2019-07-08 DIAGNOSIS — L259 Unspecified contact dermatitis, unspecified cause: Secondary | ICD-10-CM | POA: Diagnosis not present

## 2019-08-08 ENCOUNTER — Telehealth: Payer: Self-pay | Admitting: Neurology

## 2019-08-08 NOTE — Telephone Encounter (Signed)
Pt called and states that she was diagnosed here with Rheumatoid Arthritis here and she is wanting to be referred to a provider to treat her for this since the one she was going to has retired. Please advise.

## 2019-08-08 NOTE — Telephone Encounter (Signed)
I spoke with the patient. May have been a misunderstanding. She understands RA was not diagnosed by Dr. Jaynee Eagles. Pt wanted to make an appt for her worsened neuropathy, hands and feet. Some sensitivity "not there" in hands and feet/legs have worsened since last visit. Pt scheduled an appt with Amy NP for Tues 08/23/19 @ 8:00 AM for in-office visit. She understands if she has acute/emergency symptoms in the meantime to proceed to ED. She verbalized appreciation for the call.

## 2019-08-16 DIAGNOSIS — Z23 Encounter for immunization: Secondary | ICD-10-CM | POA: Diagnosis not present

## 2019-08-22 NOTE — Progress Notes (Addendum)
PATIENT: Megan Daniel DOB: [id number]  REASON FOR VISIT: follow up HISTORY FROM: patient  Chief Complaint  Patient presents with  . Follow-up    Alone. Rm 1. Patient mentioned that she would like to discuss the carpal tunnel surgery that Dr. Jaynee Eagles recommended. She stated that the numbness in her feet has worsened.      HISTORY OF PRESENT ILLNESS: Today [id number] Latrise Daniel is a 84 y.o. female here today for follow up for neuropathy and migraines. She continues verapamil for migraine management. She feels that headaches are well managed. NCS/EMG in 12/2017 showed carpal tunnel and small fiber neuropathy in feet with no evidence of worsening. She reports that many things have changed for her in 2020. She feels that the sensation has changed in her hands. She reports that she can hold a hot dish that others feel are too hard to handle. She denies burns. She does not feel that one hand is worse than the other. She uses her right more and noticed the numbness of the right hand more so than the left. She does note continued numbness of fingers with flexed position of left wrist. She also has worsening of numbness and tingling of both feet. She has had a few falls but reports they were all "careless" as she tripped over a cord. One fall was in the kitchen. She reports falling backwards in her kitchen after getting dizzy about 1-2 years ago. She hit her head on the back of her cabinets. She was evaluated by Dr Quintin Alto and reports workup was normal but would required suturing. She also admits to drinking wine that night. She does not feel that neuropathy is causing falls. She did complete PT last year and felt that this helped with strengthening. She has peripheral vascular disease. She is taking B complex vitamins that seem to help a little bit.   She knows that her blood pressures are higher than usual. She saw PCP in September and verapamil was increased to 240mg . She did not note  improvement and dose was increased again to 360mg . She started having cramps so dose was decreased to 240mg . She has not seen PCP since. She is checking her blood pressures at home from time to time and reports that readings are 160-180/80's. She is a retired Marine scientist and states that she is trying to check her own blood pressures manually. Initial BP in office today was 180/89. Recheck following completion of visit 138/74. She does report feeling anxious today.   HISTORY: (copied from Dr Cathren Laine note on 11/19/2017)  Interval history 11/19/2017:  Patient is here for a visit, new consult and new problem, hasn't been seen in over 3 years.  Reviewed referring physician notes, patient presented with depression and chronic hypothyroidism and GERD, hypertension, arthralgias myalgias alleviated by corticosteroids and NSAIDs she is on meloxicam every day, Celexa, tramadol.  She has a past medical history of hypertension, gout, hypothyroidism, polyneuropathy, rheumatoid arthritis with rheumatoid factor, mixed hyperlipidemia, major depressive disorder. She continues to have migraines. The numbness in her hands and feet are worse. She fell and hit her head above the left eye, she had a black eye. She did not go to the emergency room. She saw Dr. Midge Aver and examined the eye.    Migraines; Chronic. She has tried multiple medications such as verapamil, celexa, meloxicam, tramadol, flexeril, prednisone, propranolol. After she fell she had a little headache, migaines are going well, she really doesn't have migraines often. Still on  the verapamil.  Neuropathy: Idiopathic possibly related to her rheumatoid arthritis. Her feet feel hot, the soles always feel hot. She doesn't know why she has fallen, she started falling backwards and couldn;t catch herslef, 2 falls in the last year. She feels her balance is poor. If she closes her eyes in the shower she feels imbalance. She has dizziness when she turns around quickly but  that is not the cause of the fall. She has cut back on the wine, maybe 4 oz a few times a week, she quit smoking a little over a year ago. The burning bothers her more at night, during the day she is better some arthritc pains. She has numbness in the hands. 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 Both hands are symmetric, feet are symmetric. She has pain in one shoulder but no neck pain and no radiculopathy. She is active at home yardwork but no balancing exercises.   Hand numbness: wakes up with numbness in the hands and when she changes positions it goes away.    lab testing in the past for neuropathy included B12 and folate, methylmalonic acid, hemoglobin A1c, Lyme, lumbar puncture, Sjogren's, IFE and PE.   CMP unremarkable  HPI: Megan Daniel is a 84 y.o. female here as a follow up for headaches  Interval history: 08/16/2014. She is feeling better, no headaches. She is still on the Verapamil and no side effects. She stopped Methotrexate and she on prednisone prn, joint pain feels better.   Interval history 02/11/2014: She is feeling well. She has been on steroids for her RA.She stopped Methotrexate. No headaches. She is still on the verapamil. She takes tramadol prn. She has some chronic neuropathy that is mild.   Interval History 07/05/2014: Her headaches are improved. No real headaches since taking the prednisone and starting verapamil. She gets them possibly every once in a while, but mild. Her Verapamil pills changed colors but she is feeling the same, still helping. No side effects from the Verapamil. She is on methotrexate for Rheumatoid Arthritis and it is helping. She sees Dr. Amil Amen. Her joint pain was terrible. Her joint pains are better after a month of methotrexate. She still reports paresthesias in her feet.   Reviewed notes, labs and imaging from outside physicians, which showed: Labs in Dr Valora Piccolo office  recently showed CMP/CBC unremarkable, Hepatitis panel negative. CCP Ab negative. CRP 11, high.   Previous Appointment 23/7028:   84 year old female who is here for evaluation of headaches. This summer started having headaches daily. Wakes up in the morning and it starts hurting in the right frontal/parietal areas. Can't describe the pain, "just hurts". Tylenol will make it feel better. Bright lights make it worse or loud noises make it worse too. Takes tylenol daily which usually takes care of it but may start up again later in the day. She covers up the left eye and she feels better. Ice pack didn't help. 3 weeks ago started having chills and low grade fever and aching. Has Joint pain. RF+, ANA negative, sed rate normal but primary care is sending patient to rheumatology for evaluation. Recently diagnosed with lyme disease and took antibiotics. No rash. No tick seen. But had a bite and was given doxycycline. Has not had imaging of the brain. Lyme test was negative but had taken antibitoics before the bloodwork. Has had several insect and tick bites. The joint pain  is migrating. Feels like her face is assymmetric, one side of her face is more droopy. Has pain in the hands, hips, hands, fingers, knees but not all at the same time. No pain in the shoulders. +fatigue, is so tiredthat even holding onto the steering wheel makes her fatigued. 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 No vision changes, no pain in the temples, no jaw claudication. Has paresthesias in her feet, feet feel hot and she has to put a cool washcloth on them and sleep outside the covers. Headaches can be severe, 7/10.    REVIEW OF SYSTEMS: Out of a complete 14 system review of symptoms, the patient complains only of the following symptoms, fatigue, rare dizziness, memory loss and all other reviewed systems are negative.  ALLERGIES: Allergies  Allergen Reactions  .  Ibuprofen Other (See Comments)    heartburn    HOME MEDICATIONS: Outpatient Medications Prior to Visit  Medication Sig Dispense Refill  . aspirin EC 81 MG tablet Take 1 tablet (81 mg total) by mouth daily.    . citalopram (CELEXA) 20 MG tablet Take 20 mg by mouth daily.    . famotidine (PEPCID) 40 MG tablet Take 1 tablet (40 mg total) by mouth at bedtime. 90 tablet 3  . fexofenadine (ALLEGRA) 180 MG tablet Take 180 mg by mouth as needed for allergies or rhinitis.    Marland Kitchen levothyroxine (SYNTHROID, LEVOTHROID) 100 MCG tablet Take 100 mcg by mouth daily before breakfast.    . meloxicam (MOBIC) 15 MG tablet Take 15 mg by mouth daily as needed for pain.     . Multiple Vitamins-Minerals (AIRBORNE PO) Take by mouth daily.    . Multiple Vitamins-Minerals (PRESERVISION/LUTEIN PO) Take 1 tablet by mouth daily.     . pimecrolimus (ELIDEL) 1 % cream Apply 1 application topically daily as needed (itching ear canal).     . Probiotic Product (PROBIOTIC DAILY PO) Take 1 tablet by mouth daily. Sometimes she will take twice a day.     . traMADol (ULTRAM) 50 MG tablet Take 1 tablet (50 mg total) by mouth every 6 (six) hours as needed. (Patient taking differently: Take 50 mg by mouth every 6 (six) hours as needed for moderate pain. ) 45 tablet 5  . verapamil (CALAN-SR) 240 MG CR tablet Take 240 mg by mouth daily.     No facility-administered medications prior to visit.    PAST MEDICAL HISTORY: Past Medical History:  Diagnosis Date  . Abdominal pain   . Allergic rhinitis   . Anxiety 10/03/2013  . Arthralgia 03/31/2014  . Bronchitis 09/09/2013  . Cellulitis   . Diverticulitis   . Diverticulosis   . Fatigue   . GERD (gastroesophageal reflux disease)   . Gout   . Headache   . Headache   . Hyperchloremia   . Hypercholesterolemia   . Hypertension   . Hypothyroidism   . Lumbar strain   . Major depressive disorder   . Migraine   . Osteoporosis   . Rheumatoid arthritis ([place])     PAST SURGICAL  HISTORY: Past Surgical History:  Procedure Laterality Date  . CARPAL TUNNEL RELEASE     25 years ago  . CHOLECYSTECTOMY     20 yrs ago  . COLONOSCOPY    . COLONOSCOPY N/A 10/25/2014   Procedure: COLONOSCOPY;  Surgeon: Rogene Houston, MD;  Location: [facility];  Service: Endoscopy;  Laterality:  N/A;  830  . ESOPHAGEAL DILATION N/A 06/17/2019   Procedure: ESOPHAGEAL DILATION;  Surgeon: Rogene Houston, MD;  Location: [facility];  Service: Endoscopy;  Laterality: N/A;  . ESOPHAGOGASTRODUODENOSCOPY N/A 10/25/2014   Procedure: ESOPHAGOGASTRODUODENOSCOPY (EGD);  Surgeon: Rogene Houston, MD;  [facility]: [facility];  Service: Endoscopy;  Laterality: N/A;  . ESOPHAGOGASTRODUODENOSCOPY (EGD) WITH PROPOFOL N/A 06/17/2019   Procedure: ESOPHAGOGASTRODUODENOSCOPY (EGD) WITH PROPOFOL;  Surgeon: Rogene Houston, MD;  Location: [facility];  Service: Endoscopy;  Laterality: N/A;  130pm  . HEMORROIDECTOMY     2005  . POLYPECTOMY    . TOTAL ABDOMINAL HYSTERECTOMY     Age 33  . UPPER GASTROINTESTINAL ENDOSCOPY      FAMILY HISTORY: Family History  Problem Relation Age of Onset  . Alzheimer's disease Father   . Alzheimer's disease Sister   . Macular degeneration Sister   . Healthy Brother   . Arthritis Sister   . Arthritis Sister   . COPD Sister   . Heart disease Mother   . Seizures Sister   . Seizures Other        nephew  . Liver cancer Other   . Alcohol abuse Sister     SOCIAL HISTORY: Social History   Socioeconomic History  . Marital status: Divorced    Spouse name: Not on file  . Number of children: 0  . Years of education: Therapist, sports  . Highest education level: Not on file  Occupational History  . Not on file  Tobacco Use  . Smoking status: Former Smoker    Packs/day: 0.50    Types: Cigarettes    Quit date: 10/2016    Years since quitting: 2.8  . Smokeless tobacco: Never Used  . Tobacco comment: Quit 16 days ago after smoking for off and on for years  Substance and  Sexual Activity  . Alcohol use: Yes    Alcohol/week: 0.0 standard drinks    Comment: 4x per week, 1/2 glass of wine  . Drug use: No  . Sexual activity: Not on file  Other Topics Concern  . Not on file  Social History Narrative   Patient is divorced, no children.   Patient is a retired Equities trader.   She lives at home alone, she has a friend who stays with her some   Patient is right handed.   Patient drinks 2 cups coffee daily.   Social Determinants of Health   Financial Resource Strain:   . Difficulty of Paying Living Expenses: Not on file  Food Insecurity:   . Worried About Charity fundraiser in the Last Year: Not on file  . Ran Out of Food in the Last Year: Not on file  Transportation Needs:   . Lack of Transportation (Medical): Not on file  . Lack of Transportation (Non-Medical): Not on file  Physical Activity:   . Days of Exercise per Week: Not on file  . Minutes of Exercise per Session: Not on file  Stress:   . Feeling of Stress : Not on file  Social Connections:   . Frequency of Communication with Friends and Family: Not on file  . Frequency of Social Gatherings with Friends and Family: Not on file  . Attends Religious Services: Not on file  . Active Member of Clubs or Organizations: Not on file  . Attends Archivist Meetings: Not on file  . Marital Status: Not on file  Intimate Partner Violence:   . Fear of  Current or Ex-Partner: Not on file  . Emotionally Abused: Not on file  . Physically Abused: Not on file  . Sexually Abused: Not on file      PHYSICAL EXAM  Vitals:   08/23/19 0746  BP: 138/74  Pulse: (!) 57  Temp: (!) 97 F (36.1 C)  TempSrc: Oral  Weight: 155 lb 3.2 oz (70.4 kg)  Height: 5\' 6"  (1.676 m)   Body mass index is 25.05 kg/m.  Generalized: Well developed, in no acute distress  Cardiology: normal rate and rhythm, no murmur noted Respiratory: clear to auscultation bilaterally  Neurological examination  Mentation:  Alert oriented to time, place, history taking. Follows all commands speech and language fluent Cranial nerve II-XII: Pupils were equal round reactive to light. Extraocular movements were full, visual field were full on confrontational test. Facial sensation and strength were normal. Head turning and shoulder shrug  were normal and symmetric. Motor: The motor testing reveals 5 over 5 strength of all 4 extremities. Good symmetric motor tone is noted throughout.  Sensory: Sensory testing is intact to soft touch on all 4 extremities. No evidence of extinction is noted. Decreased pinprick to plantar surface of left foot, decreased vibratory to right ankle.  Coordination: Cerebellar testing reveals good finger-nose-finger and heel-to-shin bilaterally.  Gait and station: Gait is normal. No assistive device.  Reflexes: Deep tendon reflexes are symmetric and normal bilaterally.   DIAGNOSTIC DATA (LABS, IMAGING, TESTING) - I reviewed patient records, labs, notes, testing and imaging myself where available.  No flowsheet data found.   No results found for: WBC, HGB, HCT, MCV, PLT    Component Value Date/Time   NA 142 02/12/2015 1445   K 5.3 (H) 02/12/2015 1445   CL 102 02/12/2015 1445   CO2 25 02/12/2015 1445   GLUCOSE 87 02/12/2015 1445   BUN 12 02/12/2015 1445   CREATININE 0.74 02/12/2015 1445   CALCIUM 10.5 (H) 02/12/2015 1445   PROT 6.6 02/12/2015 1445   ALBUMIN 4.3 02/12/2015 1445   AST 30 02/12/2015 1445   ALT 28 02/12/2015 1445   ALKPHOS 74 02/12/2015 1445   BILITOT <0.2 02/12/2015 1445   GFRNONAA 78 02/12/2015 1445   GFRAA 90 02/12/2015 1445   No results found for: CHOL, HDL, LDLCALC, LDLDIRECT, TRIG, CHOLHDL Lab Results  Component Value Date   HGBA1C 5.6 07/05/2014   Lab Results  Component Value Date   VITAMINB12 820 02/12/2015   No results found for: TSH     ASSESSMENT AND PLAN 84 y.o. year old female  has a past medical history of Abdominal pain, Allergic rhinitis,  Anxiety (10/03/2013), Arthralgia (03/31/2014), Bronchitis (09/09/2013), Cellulitis, Diverticulitis, Diverticulosis, Fatigue, GERD (gastroesophageal reflux disease), Gout, Headache, Headache, Hyperchloremia, Hypercholesterolemia, Hypertension, Hypothyroidism, Lumbar strain, Major depressive disorder, Migraine, Osteoporosis, and Rheumatoid arthritis ([place]). here with     ICD-10-CM   1. Idiopathic small fiber peripheral neuropathy  G60.9   2. Carpal tunnel syndrome of left wrist  G56.02   3. Benign essential hypertension  I10     Catlyn has experienced worsening of numbness and tingling of bilateral upper and lower extremities. She is unclear if carpal tunnel symptoms have worsened but does wish to consider a referral to neurosurgery to evaluate. I have offered to place referral but she wishes to discuss with PCP first. She will continue to use wrist brace if bothersome. We will start low dose gabapentin for concerns of neuropathy. She was advised to start gabapentin 100mg  daily. We will plan to increase dose  pending response. She was advised of potential side effects. We will continue verapimil for migraine prevention. She will follow up closely with PCP regarding BP. Initial reading was elevated (180/89) but recheck 138/74. She denies any stroke like symptoms. No chest pain or trouble breathing. Exam reassuring. She will follow up with me in 3 months, sooner if needed. She verbalizes understanding and agreement with plan.    No orders of the defined types were placed in this encounter.    Meds ordered this encounter  Medications  . gabapentin (NEURONTIN) 100 MG capsule    Sig: Take 1 capsule (100 mg total) by mouth at bedtime.    Dispense:  30 capsule    Refill:  11    Order Specific Question:   Supervising Provider    Answer:   Melvenia Beam [id number]      I spent 30 minutes with the patient. 50% of this time was spent counseling and educating patient on plan of care and medications.    Debbora Presto, FNP-C 08/23/2019, 10:32 AM [facility] [address], [place], [place] [address] [phone]  Made any corrections needed, and agree with history, physical, neuro exam,assessment and plan as stated.     [name], MD [facility]

## 2019-08-23 ENCOUNTER — Ambulatory Visit (INDEPENDENT_AMBULATORY_CARE_PROVIDER_SITE_OTHER): Payer: Medicare Other | Admitting: Family Medicine

## 2019-08-23 ENCOUNTER — Encounter: Payer: Self-pay | Admitting: Family Medicine

## 2019-08-23 ENCOUNTER — Other Ambulatory Visit: Payer: Self-pay

## 2019-08-23 VITALS — BP 138/74 | HR 57 | Temp 97.0°F | Ht 66.0 in | Wt 155.2 lb

## 2019-08-23 DIAGNOSIS — I10 Essential (primary) hypertension: Secondary | ICD-10-CM

## 2019-08-23 DIAGNOSIS — G5602 Carpal tunnel syndrome, left upper limb: Secondary | ICD-10-CM

## 2019-08-23 DIAGNOSIS — G609 Hereditary and idiopathic neuropathy, unspecified: Secondary | ICD-10-CM

## 2019-08-23 MED ORDER — GABAPENTIN 100 MG PO CAPS: 100.0000 mg | ORAL_CAPSULE | Freq: Every day | ORAL | 11 refills | Status: DC

## 2019-08-23 NOTE — Patient Instructions (Addendum)
Please call Dr Edythe Lynn office today to schedule an appointment to review your blood pressures. I am very worried about these blood pressure readings. Recheck of BP was much better. Could be related to anxiety!   Please let us know if you wish to see neurosurgery to evaluate you for surgical repair of carpal tunnel. Please wear carpal tunnel brace.  We will try a very low dose of gabapentin for peripheral neuropathy. Take 1 capsule in the evening. We can increase this dose if it works well. Fall precautions advised.   Follow up in 3 months   Fall Prevention in the Home, Adult Falls can cause injuries. They can happen to people of all ages. There are many things you can do to make your home safe and to help prevent falls. Ask for help when making these changes, if needed. What actions can I take to prevent falls? General Instructions  Use good lighting in all rooms. Replace any light bulbs that burn out.  Turn on the lights when you go into a dark area. Use night-lights.  Keep items that you use often in easy-to-reach places. Lower the shelves around your home if necessary.  Set up your furniture so you have a clear path. Avoid moving your furniture around.  Do not have throw rugs and other things on the floor that can make you trip.  Avoid walking on wet floors.  If any of your floors are uneven, fix them.  Add color or contrast paint or tape to clearly mark and help you see: ? Any grab bars or handrails. ? First and last steps of stairways. ? Where the edge of each step is.  If you use a stepladder: ? Make sure that it is fully opened. Do not climb a closed stepladder. ? Make sure that both sides of the stepladder are locked into place. ? Ask someone to hold the stepladder for you while you use it.  If there are any pets around you, be aware of where they are. What can I do in the bathroom?      Keep the floor dry. Clean up any water that spills onto the floor as soon  as it happens.  Remove soap buildup in the tub or shower regularly.  Use non-skid mats or decals on the floor of the tub or shower.  Attach bath mats securely with double-sided, non-slip rug tape.  If you need to sit down in the shower, use a plastic, non-slip stool.  Install grab bars by the toilet and in the tub and shower. Do not use towel bars as grab bars. What can I do in the bedroom?  Make sure that you have a light by your bed that is easy to reach.  Do not use any sheets or blankets that are too big for your bed. They should not hang down onto the floor.  Have a firm chair that has side arms. You can use this for support while you get dressed. What can I do in the kitchen?  Clean up any spills right away.  If you need to reach something above you, use a strong step stool that has a grab bar.  Keep electrical cords out of the way.  Do not use floor polish or wax that makes floors slippery. If you must use wax, use non-skid floor wax. What can I do with my stairs?  Do not leave any items on the stairs.  Make sure that you have a light switch at  the top of the stairs and the bottom of the stairs. If you do not have them, ask someone to add them for you.  Make sure that there are handrails on both sides of the stairs, and use them. Fix handrails that are broken or loose. Make sure that handrails are as long as the stairways.  Install non-slip stair treads on all stairs in your home.  Avoid having throw rugs at the top or bottom of the stairs. If you do have throw rugs, attach them to the floor with carpet tape.  Choose a carpet that does not hide the edge of the steps on the stairway.  Check any carpeting to make sure that it is firmly attached to the stairs. Fix any carpet that is loose or worn. What can I do on the outside of my home?  Use bright outdoor lighting.  Regularly fix the edges of walkways and driveways and fix any cracks.  Remove anything that might  make you trip as you walk through a door, such as a raised step or threshold.  Trim any bushes or trees on the path to your home.  Regularly check to see if handrails are loose or broken. Make sure that both sides of any steps have handrails.  Install guardrails along the edges of any raised decks and porches.  Clear walking paths of anything that might make someone trip, such as tools or rocks.  Have any leaves, snow, or ice cleared regularly.  Use sand or salt on walking paths during winter.  Clean up any spills in your garage right away. This includes grease or oil spills. What other actions can I take?  Wear shoes that: ? Have a low heel. Do not wear high heels. ? Have rubber bottoms. ? Are comfortable and fit you well. ? Are closed at the toe. Do not wear open-toe sandals.  Use tools that help you move around (mobility aids) if they are needed. These include: ? Canes. ? Walkers. ? Scooters. ? Crutches.  Review your medicines with your doctor. Some medicines can make you feel dizzy. This can increase your chance of falling. Ask your doctor what other things you can do to help prevent falls. Where to find more information  Centers for Disease Control and Prevention, [employer]: https://garcia.biz/  [employer] on Aging: BrainJudge.co.uk Contact a doctor if:  You are afraid of falling at home.  You feel weak, drowsy, or dizzy at home.  You fall at home. Summary  There are many simple things that you can do to make your home safe and to help prevent falls.  Ways to make your home safe include removing tripping hazards and installing grab bars in the bathroom.  Ask for help when making these changes in your home. This information is not intended to replace advice given to you by your health care provider. Make sure you discuss any questions you have with your health care provider. Document Revised: 11/11/2018 Document Reviewed: 03/05/2017 Elsevier Patient  Education  [place].   Peripheral Neuropathy Peripheral neuropathy is a type of nerve damage. It affects nerves that carry signals between the spinal cord and the arms, legs, and the rest of the body (peripheral nerves). It does not affect nerves in the spinal cord or brain. In peripheral neuropathy, one nerve or a group of nerves may be damaged. Peripheral neuropathy is a broad category that includes many specific nerve disorders, like diabetic neuropathy, hereditary neuropathy, and carpal tunnel syndrome. What are  the causes? This condition may be caused by:  Diabetes. This is the most common cause of peripheral neuropathy.  Nerve injury.  Pressure or stress on a nerve that lasts a long time.  Lack (deficiency) of B vitamins. This can result from alcoholism, poor diet, or a restricted diet.  Infections.  Autoimmune diseases, such as rheumatoid arthritis and systemic lupus erythematosus.  Nerve diseases that are passed from parent to child (inherited).  Some medicines, such as cancer medicines (chemotherapy).  Poisonous (toxic) substances, such as lead and mercury.  Too little blood flowing to the legs.  Kidney disease.  Thyroid disease. In some cases, the cause of this condition is not known. What are the signs or symptoms? Symptoms of this condition depend on which of your nerves is damaged. Common symptoms include:  Loss of feeling (numbness) in the feet, hands, or both.  Tingling in the feet, hands, or both.  Burning pain.  Very sensitive skin.  Weakness.  Not being able to move a part of the body (paralysis).  Muscle twitching.  Clumsiness or poor coordination.  Loss of balance.  Not being able to control your bladder.  Feeling dizzy.  Sexual problems. How is this diagnosed? Diagnosing and finding the cause of peripheral neuropathy can be difficult. Your health care provider will take your medical history and do a physical exam. A  neurological exam will also be done. This involves checking things that are affected by your brain, spinal cord, and nerves (nervous system). For example, your health care provider will check your reflexes, how you move, and what you can feel. You may have other tests, such as:  Blood tests.  Electromyogram (EMG) and nerve conduction tests. These tests check nerve function and how well the nerves are controlling the muscles.  Imaging tests, such as CT scans or MRI to rule out other causes of your symptoms.  Removing a small piece of nerve to be examined in a lab (nerve biopsy). This is rare.  Removing and examining a small amount of the fluid that surrounds the brain and spinal cord (lumbar puncture). This is rare. How is this treated? Treatment for this condition may involve:  Treating the underlying cause of the neuropathy, such as diabetes, kidney disease, or vitamin deficiencies.  Stopping medicines that can cause neuropathy, such as chemotherapy.  Medicine to relieve pain. Medicines may include: ? Prescription or over-the-counter pain medicine. ? Antiseizure medicine. ? Antidepressants. ? Pain-relieving patches that are applied to painful areas of skin.  Surgery to relieve pressure on a nerve or to destroy a nerve that is causing pain.  Physical therapy to help improve movement and balance.  Devices to help you move around (assistive devices). Follow these instructions at home: Medicines  Take over-the-counter and prescription medicines only as told by your health care provider. Do not take any other medicines without first asking your health care provider.  Do not drive or use heavy machinery while taking prescription pain medicine. Lifestyle   Do not use any products that contain nicotine or tobacco, such as cigarettes and e-cigarettes. Smoking keeps blood from reaching damaged nerves. If you need help quitting, ask your health care provider.  Avoid or limit alcohol.  Too much alcohol can cause a vitamin B deficiency, and vitamin B is needed for healthy nerves.  Eat a healthy diet. This includes: ? Eating foods that are high in fiber, such as fresh fruits and vegetables, whole grains, and beans. ? Limiting foods that are high in  fat and processed sugars, such as fried or sweet foods. General instructions   If you have diabetes, work closely with your health care provider to keep your blood sugar under control.  If you have numbness in your feet: ? Check every day for signs of injury or infection. Watch for redness, warmth, and swelling. ? Wear padded socks and comfortable shoes. These help protect your feet.  Develop a good support system. Living with peripheral neuropathy can be stressful. Consider talking with a mental health specialist or joining a support group.  Use assistive devices and attend physical therapy as told by your health care provider. This may include using a walker or a cane.  Keep all follow-up visits as told by your health care provider. This is important. Contact a health care provider if:  You have new signs or symptoms of peripheral neuropathy.  You are struggling emotionally from dealing with peripheral neuropathy.  Your pain is not well-controlled. Get help right away if:  You have an injury or infection that is not healing normally.  You develop new weakness in an arm or leg.  You fall frequently. Summary  Peripheral neuropathy is when the nerves in the arms, or legs are damaged, resulting in numbness, weakness, or pain.  There are many causes of peripheral neuropathy, including diabetes, pinched nerves, vitamin deficiencies, autoimmune disease, and hereditary conditions.  Diagnosing and finding the cause of peripheral neuropathy can be difficult. Your health care provider will take your medical history, do a physical exam, and do tests, including blood tests and nerve function tests.  Treatment involves  treating the underlying cause of the neuropathy and taking medicines to help control pain. Physical therapy and assistive devices may also help. This information is not intended to replace advice given to you by your health care provider. Make sure you discuss any questions you have with your health care provider. Document Revised: 07/03/2017 Document Reviewed: 09/29/2016 Elsevier Patient Education  [place].  Gabapentin capsules or tablets What is this medicine? GABAPENTIN (GA ba pen tin) is used to control seizures in certain types of epilepsy. It is also used to treat certain types of nerve pain. This medicine may be used for other purposes; ask your health care provider or pharmacist if you have questions. COMMON BRAND NAME(S): Active-PAC with Gabapentin, Gabarone, Neurontin What should I tell my health care provider before I take this medicine? They need to know if you have any of these conditions:  history of drug abuse or alcohol abuse problem  kidney disease  lung or breathing disease  suicidal thoughts, plans, or attempt; a previous suicide attempt by you or a family member  an unusual or allergic reaction to gabapentin, other medicines, foods, dyes, or preservatives  pregnant or trying to get pregnant  breast-feeding How should I use this medicine? Take this medicine by mouth with a glass of water. Follow the directions on the prescription label. You can take it with or without food. If it upsets your stomach, take it with food. Take your medicine at regular intervals. Do not take it more often than directed. Do not stop taking except on your doctor's advice. If you are directed to break the 600 or 800 mg tablets in half as part of your dose, the extra half tablet should be used for the next dose. If you have not used the extra half tablet within 28 days, it should be thrown away. A special MedGuide will be given to you by the  pharmacist with each prescription and  refill. Be sure to read this information carefully each time. Talk to your pediatrician regarding the use of this medicine in children. While this drug may be prescribed for children as young as 3 years for selected conditions, precautions do apply. Overdosage: If you think you have taken too much of this medicine contact a poison control center or emergency room at once. NOTE: This medicine is only for you. Do not share this medicine with others. What if I miss a dose? If you miss a dose, take it as soon as you can. If it is almost time for your next dose, take only that dose. Do not take double or extra doses. What may interact with this medicine? This medicine may interact with the following medications:  alcohol  antihistamines for allergy, cough, and cold  certain medicines for anxiety or sleep  certain medicines for depression like amitriptyline, fluoxetine, sertraline  certain medicines for seizures like phenobarbital, primidone  certain medicines for stomach problems  general anesthetics like halothane, isoflurane, methoxyflurane, propofol  local anesthetics like lidocaine, pramoxine, tetracaine  medicines that relax muscles for surgery  narcotic medicines for pain  phenothiazines like chlorpromazine, mesoridazine, prochlorperazine, thioridazine This list may not describe all possible interactions. Give your health care provider a list of all the medicines, herbs, non-prescription drugs, or dietary supplements you use. Also tell them if you smoke, drink alcohol, or use illegal drugs. Some items may interact with your medicine. What should I watch for while using this medicine? Visit your doctor or health care provider for regular checks on your progress. You may want to keep a record at home of how you feel your condition is responding to treatment. You may want to share this information with your doctor or health care provider at each visit. You should contact your doctor or  health care provider if your seizures get worse or if you have any new types of seizures. Do not stop taking this medicine or any of your seizure medicines unless instructed by your doctor or health care provider. Stopping your medicine suddenly can increase your seizures or their severity. This medicine may cause serious skin reactions. They can happen weeks to months after starting the medicine. Contact your health care provider right away if you notice fevers or flu-like symptoms with a rash. The rash may be red or purple and then turn into blisters or peeling of the skin. Or, you might notice a red rash with swelling of the face, lips or lymph nodes in your neck or under your arms. Wear a medical identification bracelet or chain if you are taking this medicine for seizures, and carry a card that lists all your medications. You may get drowsy, dizzy, or have blurred vision. Do not drive, use machinery, or do anything that needs mental alertness until you know how this medicine affects you. To reduce dizzy or fainting spells, do not sit or stand up quickly, especially if you are an older patient. Alcohol can increase drowsiness and dizziness. Avoid alcoholic drinks. Your mouth may get dry. Chewing sugarless gum or sucking hard candy, and drinking plenty of water will help. The use of this medicine may increase the chance of suicidal thoughts or actions. Pay special attention to how you are responding while on this medicine. Any worsening of mood, or thoughts of suicide or dying should be reported to your health care provider right away. Women who become pregnant while using this medicine may enroll in  the [place] Antiepileptic Drug Pregnancy Registry by calling [phone]. This registry collects information about the safety of antiepileptic drug use during pregnancy. What side effects may I notice from receiving this medicine? Side effects that you should report to your doctor or health care  professional as soon as possible:  allergic reactions like skin rash, itching or hives, swelling of the face, lips, or tongue  breathing problems  rash, fever, and swollen lymph nodes  redness, blistering, peeling or loosening of the skin, including inside the mouth  suicidal thoughts, mood changes Side effects that usually do not require medical attention (report to your doctor or health care professional if they continue or are bothersome):  dizziness  drowsiness  headache  nausea, vomiting  swelling of ankles, feet, hands  tiredness This list may not describe all possible side effects. Call your doctor for medical advice about side effects. You may report side effects to FDA at 1-800-FDA-1088. Where should I keep my medicine? Keep out of reach of children. This medicine may cause accidental overdose and death if it taken by other adults, children, or pets. Mix any unused medicine with a substance like cat litter or coffee grounds. Then throw the medicine away in a sealed container like a sealed bag or a coffee can with a lid. Do not use the medicine after the expiration date. Store at room temperature between 15 and 30 degrees C (59 and 86 degrees F). NOTE: This sheet is a summary. It may not cover all possible information. If you have questions about this medicine, talk to your doctor, pharmacist, or health care provider.  2020 Elsevier/Gold Standard (2018-10-22 14:16:43)    Stroke Prevention Some medical conditions and lifestyle choices can lead to a higher risk for a stroke. You can help to prevent a stroke by making nutrition, lifestyle, and other changes. What nutrition changes can be made?   Eat healthy foods. ? Choose foods that are high in fiber. These include:  Fresh fruits.  Fresh vegetables.  Whole grains. ? Eat at least 5 or more servings of fruits and vegetables each day. Try to fill half of your plate at each meal with fruits and vegetables. ? Choose  lean protein foods. These include:  Lowfat (lean) cuts of meat.  Chicken without skin.  Fish.  Tofu.  Beans.  Nuts. ? Eat low-fat dairy products. ? Avoid foods that:  Are high in salt (sodium).  Have saturated fat.  Have trans fat.  Have cholesterol.  Are processed.  Are premade.  Follow eating guidelines as told by your doctor. These may include: ? Reducing how many calories you eat and drink each day. ? Limiting how much salt you eat or drink each day to 1,500 milligrams (mg). ? Using only healthy fats for cooking. These include:  Olive oil.  Canola oil.  Sunflower oil. ? Counting how many carbohydrates you eat and drink each day. What lifestyle changes can be made?  Try to stay at a healthy weight. Talk to your doctor about what a good weight is for you.  Get at least 30 minutes of moderate physical activity at least 5 days a week. This can include: ? Fast walking. ? Biking. ? Swimming.  Do not use any products that have nicotine or tobacco. This includes cigarettes and e-cigarettes. If you need help quitting, ask your doctor. Avoid being around tobacco smoke in general.  Limit how much alcohol you drink to no more than 1 drink a day for nonpregnant  women and 2 drinks a day for men. One drink equals 12 oz of beer, 5 oz of wine, or 1 oz of hard liquor.  Do not use drugs.  Avoid taking birth control pills. Talk to your doctor about the risks of taking birth control pills if: ? You are over 82 years old. ? You smoke. ? You get migraines. ? You have had a blood clot. What other changes can be made?  Manage your cholesterol. ? It is important to eat a healthy diet. ? If your cholesterol cannot be managed through your diet, you may also need to take medicines. Take medicines as told by your doctor.  Manage your diabetes. ? It is important to eat a healthy diet and to exercise regularly. ? If your blood sugar cannot be managed through diet and exercise,  you may need to take medicines. Take medicines as told by your doctor.  Control your high blood pressure (hypertension). ? Try to keep your blood pressure below 130/80. This can help lower your risk of stroke. ? It is important to eat a healthy diet and to exercise regularly. ? If your blood pressure cannot be managed through diet and exercise, you may need to take medicines. Take medicines as told by your doctor. ? Ask your doctor if you should check your blood pressure at home. ? Have your blood pressure checked every year. Do this even if your blood pressure is normal.  Talk to your doctor about getting checked for a sleep disorder. Signs of this can include: ? Snoring a lot. ? Feeling very tired.  Take over-the-counter and prescription medicines only as told by your doctor. These may include aspirin or blood thinners (antiplatelets or anticoagulants).  Make sure that any other medical conditions you have are managed. Where to find more information  American Stroke Association: www.strokeassociation.org  National Stroke Association: www.stroke.org Get help right away if:  You have any symptoms of stroke. "BE FAST" is an easy way to remember the main warning signs: ? B - Balance. Signs are dizziness, sudden trouble walking, or loss of balance. ? E - Eyes. Signs are trouble seeing or a sudden change in how you see. ? F - Face. Signs are sudden weakness or loss of feeling of the face, or the face or eyelid drooping on one side. ? A - Arms. Signs are weakness or loss of feeling in an arm. This happens suddenly and usually on one side of the body. ? S - Speech. Signs are sudden trouble speaking, slurred speech, or trouble understanding what people say. ? T - Time. Time to call emergency services. Write down what time symptoms started.  You have other signs of stroke, such as: ? A sudden, very bad headache with no known cause. ? Feeling sick to your stomach (nausea). ? Throwing up  (vomiting). ? Jerky movements you cannot control (seizure). These symptoms may represent a serious problem that is an emergency. Do not wait to see if the symptoms will go away. Get medical help right away. Call your local emergency services (911 in the [place] Do not drive yourself to the hospital. Summary  You can prevent a stroke by eating healthy, exercising, not smoking, drinking less alcohol, and treating other health problems, such as diabetes, high blood pressure, or high cholesterol.  Do not use any products that contain nicotine or tobacco, such as cigarettes and e-cigarettes.  Get help right away if you have any signs or symptoms of a stroke. This  information is not intended to replace advice given to you by your health care provider. Make sure you discuss any questions you have with your health care provider. Document Revised: 09/16/2018 Document Reviewed: 10/22/2016 Elsevier Patient Education  [place].

## 2019-09-16 DIAGNOSIS — Z23 Encounter for immunization: Secondary | ICD-10-CM | POA: Diagnosis not present

## 2019-10-19 DIAGNOSIS — R7301 Impaired fasting glucose: Secondary | ICD-10-CM | POA: Diagnosis not present

## 2019-10-19 DIAGNOSIS — R5382 Chronic fatigue, unspecified: Secondary | ICD-10-CM | POA: Diagnosis not present

## 2019-10-19 DIAGNOSIS — E7800 Pure hypercholesterolemia, unspecified: Secondary | ICD-10-CM | POA: Diagnosis not present

## 2019-10-19 DIAGNOSIS — E559 Vitamin D deficiency, unspecified: Secondary | ICD-10-CM | POA: Diagnosis not present

## 2019-10-19 DIAGNOSIS — E039 Hypothyroidism, unspecified: Secondary | ICD-10-CM | POA: Diagnosis not present

## 2019-10-19 DIAGNOSIS — I10 Essential (primary) hypertension: Secondary | ICD-10-CM | POA: Diagnosis not present

## 2019-10-19 DIAGNOSIS — M0609 Rheumatoid arthritis without rheumatoid factor, multiple sites: Secondary | ICD-10-CM | POA: Diagnosis not present

## 2019-10-19 DIAGNOSIS — E782 Mixed hyperlipidemia: Secondary | ICD-10-CM | POA: Diagnosis not present

## 2019-10-24 DIAGNOSIS — F3342 Major depressive disorder, recurrent, in full remission: Secondary | ICD-10-CM | POA: Diagnosis not present

## 2019-10-24 DIAGNOSIS — M0609 Rheumatoid arthritis without rheumatoid factor, multiple sites: Secondary | ICD-10-CM | POA: Diagnosis not present

## 2019-10-24 DIAGNOSIS — G629 Polyneuropathy, unspecified: Secondary | ICD-10-CM | POA: Diagnosis not present

## 2019-10-24 DIAGNOSIS — E782 Mixed hyperlipidemia: Secondary | ICD-10-CM | POA: Diagnosis not present

## 2019-10-24 DIAGNOSIS — M109 Gout, unspecified: Secondary | ICD-10-CM | POA: Diagnosis not present

## 2019-10-24 DIAGNOSIS — I10 Essential (primary) hypertension: Secondary | ICD-10-CM | POA: Diagnosis not present

## 2019-10-24 DIAGNOSIS — Z6824 Body mass index (BMI) 24.0-24.9, adult: Secondary | ICD-10-CM | POA: Diagnosis not present

## 2019-10-24 DIAGNOSIS — E039 Hypothyroidism, unspecified: Secondary | ICD-10-CM | POA: Diagnosis not present

## 2019-10-26 DIAGNOSIS — H43813 Vitreous degeneration, bilateral: Secondary | ICD-10-CM | POA: Diagnosis not present

## 2019-10-26 DIAGNOSIS — Z961 Presence of intraocular lens: Secondary | ICD-10-CM | POA: Diagnosis not present

## 2019-11-02 DIAGNOSIS — M109 Gout, unspecified: Secondary | ICD-10-CM | POA: Diagnosis not present

## 2019-11-02 DIAGNOSIS — I10 Essential (primary) hypertension: Secondary | ICD-10-CM | POA: Diagnosis not present

## 2019-11-10 DIAGNOSIS — L03211 Cellulitis of face: Secondary | ICD-10-CM | POA: Diagnosis not present

## 2019-11-10 DIAGNOSIS — S0086XA Insect bite (nonvenomous) of other part of head, initial encounter: Secondary | ICD-10-CM | POA: Diagnosis not present

## 2019-11-10 DIAGNOSIS — Z6824 Body mass index (BMI) 24.0-24.9, adult: Secondary | ICD-10-CM | POA: Diagnosis not present

## 2019-11-16 ENCOUNTER — Telehealth: Payer: Self-pay | Admitting: Family Medicine

## 2019-11-16 NOTE — Telephone Encounter (Addendum)
Pt has called and stated she wanted to cancel her upcoming appointment with Amy,NP and would not return if she could not see Dr Jaynee Eagles.  It was explained to pt that the office policy is that f/u appointments are done with NP's.  Pt stated her Jan appointment was not a f/u but a 1st time appointment with Dr Jaynee Eagles since 2019.  Pt has asked to be scheduled with Dr Jaynee Eagles because the medication prescribed by Amy, NP did not help her at all. This is a FYI, pt did not ask to be called back.  When phone rep went back to call to schedule pt with Dr Cathren Laine 1st available pt hand hung up.

## 2019-11-17 NOTE — Telephone Encounter (Signed)
Dr Jaynee Eagles, I did start a very low dose of gabapentin. She could probably stand to increase to see if this helps. I was concerned about her age. I asked her to call me if she tolerated it well and we would increase but I didn't hear back. She was very pleasant during visit. Hopefully she just needs a dose increase.

## 2019-11-17 NOTE — Telephone Encounter (Signed)
That's fine I know this patient very well. Schedule her right before lunch or at the end of the day so I hve more time with her.

## 2019-11-17 NOTE — Telephone Encounter (Signed)
Noted  

## 2019-11-17 NOTE — Telephone Encounter (Signed)
I tried to reach the pt to schedule an appt with Dr. Jaynee Eagles. LVM asking for call back. When the pt calls back, please offer Dr. Cathren Laine next available office visit.

## 2019-11-18 NOTE — Telephone Encounter (Signed)
Thank you, she has been a patient of mine for many years which is the only reason she wants to come back to me we have a long history and know each other well, thanks!

## 2019-11-21 ENCOUNTER — Ambulatory Visit: Payer: Medicare Other | Admitting: Family Medicine

## 2019-12-01 DIAGNOSIS — E039 Hypothyroidism, unspecified: Secondary | ICD-10-CM | POA: Diagnosis not present

## 2019-12-01 DIAGNOSIS — E7849 Other hyperlipidemia: Secondary | ICD-10-CM | POA: Diagnosis not present

## 2019-12-02 DIAGNOSIS — S70361A Insect bite (nonvenomous), right thigh, initial encounter: Secondary | ICD-10-CM | POA: Diagnosis not present

## 2019-12-02 DIAGNOSIS — Z6824 Body mass index (BMI) 24.0-24.9, adult: Secondary | ICD-10-CM | POA: Diagnosis not present

## 2020-01-18 DIAGNOSIS — R300 Dysuria: Secondary | ICD-10-CM | POA: Diagnosis not present

## 2020-01-18 DIAGNOSIS — R3911 Hesitancy of micturition: Secondary | ICD-10-CM | POA: Diagnosis not present

## 2020-02-21 ENCOUNTER — Telehealth: Payer: Self-pay | Admitting: Neurology

## 2020-02-21 NOTE — Telephone Encounter (Signed)
Megan Daniel would you call this absolutely lovely patient and see if she wants to do her appointment via phone or video tomorrow instead of having her drive all the way to th office? She is welcome to come to the office if she likes I was just thinking of her drive and the new Delta variant. Alternatively she can reschedule her appointment with me at a later date if she is improved or stable. thanks

## 2020-02-22 ENCOUNTER — Ambulatory Visit (INDEPENDENT_AMBULATORY_CARE_PROVIDER_SITE_OTHER): Payer: Medicare Other | Admitting: Neurology

## 2020-02-22 ENCOUNTER — Encounter: Payer: Self-pay | Admitting: Neurology

## 2020-02-22 ENCOUNTER — Other Ambulatory Visit: Payer: Self-pay

## 2020-02-22 VITALS — BP 187/71 | HR 56 | Ht 66.0 in | Wt 150.0 lb

## 2020-02-22 DIAGNOSIS — G43809 Other migraine, not intractable, without status migrainosus: Secondary | ICD-10-CM

## 2020-02-22 DIAGNOSIS — G4719 Other hypersomnia: Secondary | ICD-10-CM

## 2020-02-22 DIAGNOSIS — G629 Polyneuropathy, unspecified: Secondary | ICD-10-CM | POA: Diagnosis not present

## 2020-02-22 DIAGNOSIS — G609 Hereditary and idiopathic neuropathy, unspecified: Secondary | ICD-10-CM | POA: Diagnosis not present

## 2020-02-22 DIAGNOSIS — G5600 Carpal tunnel syndrome, unspecified upper limb: Secondary | ICD-10-CM | POA: Diagnosis not present

## 2020-02-22 NOTE — Progress Notes (Signed)
PATIENT: Megan Daniel DOB: [birth year]  REASON FOR VISIT: follow up HISTORY FROM: patient  Chief Complaint  Patient presents with  . Follow-up    pt alone, rm 3. pt presents as a follow up visit. states was having some concerns with memory recently and feeling tired. states that she started taking vitamin b and D and is noticing some improvement.      Interval history 02/23/2020: She get sleepy when she sit down, she goes to sleep about 10pm, she wakes up during the night between 3-4 and goes to the bathroom maybe once, she wakes up about 7am, she was suffering from depression last year and she thinks this may have affected her due to a death in her niece. She passsed away in January 2020. She can go to sleep sitting up straight. She thinks the Tumeric is helping her arthritis. She had bloodwork rcently and thyroid is normal. She does not have migraines. The peripheral neuropathy in her feet are stable. Dr. Amedeo Plenty at [facility] when she feels it is time to treat the CTS. we had a very long talk today about sleep, sequelae of untreated sleep apnea and why I think its important for her to have a test given the extreme sleepiness she describes, she was very hesitant about sleep study however her ESS is 14 and she says that she can fall asleep everywhere and anywhere, she can fall asleep sitting straight up in a chair, I do think that a sleep evaluation would help patient. Migraines stable. Neuropathy stable. CTS symptoms stable.   Patient complains of symptoms per HPI as well as the following symptoms: sleepiness . Pertinent negatives and positives per HPI. All others negative   HISTORY OF PRESENT ILLNESS: Today 59/56/38 Megan Daniel is a 84 y.o. female here today for follow up for neuropathy and migraines. She continues verapamil for migraine management. She feels that headaches are well managed. NCS/EMG in 12/2017 showed carpal tunnel and small fiber neuropathy in feet with no  evidence of worsening. She reports that many things have changed for her in 2020. She feels that the sensation has changed in her hands. She reports that she can hold a hot dish that others feel are too hard to handle. She denies burns. She does not feel that one hand is worse than the other. She uses her right more and noticed the numbness of the right hand more so than the left. She does note continued numbness of fingers with flexed position of left wrist. She also has worsening of numbness and tingling of both feet. She has had a few falls but reports they were all "careless" as she tripped over a cord. One fall was in the kitchen. She reports falling backwards in her kitchen after getting dizzy about 1-2 years ago. She hit her head on the back of her cabinets. She was evaluated by Dr Quintin Alto and reports workup was normal but would required suturing. She also admits to drinking wine that night. She does not feel that neuropathy is causing falls. She did complete PT last year and felt that this helped with strengthening. She has peripheral vascular disease. She is taking B complex vitamins that seem to help a little bit.   She knows that her blood pressures are higher than usual. She saw PCP in September and verapamil was increased to 240mg . She did not note improvement and dose was increased again to 360mg . She started having cramps so dose was decreased to  240mg . She has not seen PCP since. She is checking her blood pressures at home from time to time and reports that readings are 160-180/80's. She is a retired Marine scientist and states that she is trying to check her own blood pressures manually. Initial BP in office today was 180/89. Recheck following completion of visit 138/74. She does report feeling anxious today.   HISTORY: (copied from Dr Cathren Laine note on 11/19/2017)  Interval history 11/19/2017:  Patient is here for a visit, new consult and new problem, hasn't been seen in over 3 years.  Reviewed referring  physician notes, patient presented with depression and chronic hypothyroidism and GERD, hypertension, arthralgias myalgias alleviated by corticosteroids and NSAIDs she is on meloxicam every day, Celexa, tramadol.  She has a past medical history of hypertension, gout, hypothyroidism, polyneuropathy, rheumatoid arthritis with rheumatoid factor, mixed hyperlipidemia, major depressive disorder. She continues to have migraines. The numbness in her hands and feet are worse. She fell and hit her head above the left eye, she had a black eye. She did not go to the emergency room. She saw Dr. Midge Aver and examined the eye.    Migraines; Chronic. She has tried multiple medications such as verapamil, celexa, meloxicam, tramadol, flexeril, prednisone, propranolol. After she fell she had a little headache, migaines are going well, she really doesn't have migraines often. Still on the verapamil.  Neuropathy: Idiopathic possibly related to her rheumatoid arthritis. Her feet feel hot, the soles always feel hot. She doesn't know why she has fallen, she started falling backwards and couldn;t catch herslef, 2 falls in the last year. She feels her balance is poor. If she closes her eyes in the shower she feels imbalance. She has dizziness when she turns around quickly but that is not the cause of the fall. She has cut back on the wine, maybe 4 oz a few times a week, she quit smoking a little over a year ago. The burning bothers her more at night, during the day she is better some arthritc pains. She has numbness in the hands. 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 Both hands are symmetric, feet are symmetric. She has pain in one shoulder but no neck pain and no radiculopathy. She is active at home yardwork but no balancing exercises.   Hand numbness: wakes up with numbness in the hands and when she changes positions it goes away.    lab testing in the past  for neuropathy included B12 and folate, methylmalonic acid, hemoglobin A1c, Lyme, lumbar puncture, Sjogren's, IFE and PE.   CMP unremarkable  HPI: Megan Daniel is a 84 y.o. female here as a follow up for headaches  Interval history: 08/16/2014. She is feeling better, no headaches. She is still on the Verapamil and no side effects. She stopped Methotrexate and she on prednisone prn, joint pain feels better.   Interval history 02/11/2014: She is feeling well. She has been on steroids for her RA.She stopped Methotrexate. No headaches. She is still on the verapamil. She takes tramadol prn. She has some chronic neuropathy that is mild.   Interval History 07/05/2014: Her headaches are improved. No real headaches since taking the prednisone and starting verapamil. She gets them possibly every once in a while, but mild. Her Verapamil pills changed colors but she is feeling the same, still helping. No side effects from the Verapamil. She is on methotrexate for Rheumatoid Arthritis and it  is helping. She sees Dr. Amil Amen. Her joint pain was terrible. Her joint pains are better after a month of methotrexate. She still reports paresthesias in her feet.   Reviewed notes, labs and imaging from outside physicians, which showed: Labs in Dr Valora Piccolo office recently showed CMP/CBC unremarkable, Hepatitis panel negative. CCP Ab negative. CRP 11, high.   Previous Appointment 66/1437:   84 year old female who is here for evaluation of headaches. This summer started having headaches daily. Wakes up in the morning and it starts hurting in the right frontal/parietal areas. Can't describe the pain, "just hurts". Tylenol will make it feel better. Bright lights make it worse or loud noises make it worse too. Takes tylenol daily which usually takes care of it but may start up again later in the day. She covers up the left eye and she feels better. Ice pack didn't help. 3 weeks ago started having chills and low  grade fever and aching. Has Joint pain. RF+, ANA negative, sed rate normal but primary care is sending patient to rheumatology for evaluation. Recently diagnosed with lyme disease and took antibiotics. No rash. No tick seen. But had a bite and was given doxycycline. Has not had imaging of the brain. Lyme test was negative but had taken antibitoics before the bloodwork. Has had several insect and tick bites. The joint pain is migrating. Feels like her face is assymmetric, one side of her face is more droopy. Has pain in the hands, hips, hands, fingers, knees but not all at the same time. No pain in the shoulders. +fatigue, is so tiredthat even holding onto the steering wheel makes her fatigued. 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 No vision changes, no pain in the temples, no jaw claudication. Has paresthesias in her feet, feet feel hot and she has to put a cool washcloth on them and sleep outside the covers. Headaches can be severe, 7/10.    REVIEW OF SYSTEMS: Out of a complete 14 system review of symptoms, the patient complains only of the following symptoms, fatigue, rare dizziness, memory loss and all other reviewed systems are negative.  ALLERGIES: Allergies  Allergen Reactions  . Ibuprofen Other (See Comments)    heartburn    HOME MEDICATIONS: Outpatient Medications Prior to Visit  Medication Sig Dispense Refill  . B Complex-C (SUPER B COMPLEX PO) Take by mouth.    . citalopram (CELEXA) 20 MG tablet Take 20 mg by mouth daily.    . famotidine (PEPCID) 40 MG tablet Take 1 tablet (40 mg total) by mouth at bedtime. 90 tablet 3  . fexofenadine (ALLEGRA) 180 MG tablet Take 180 mg by mouth as needed for allergies or rhinitis.    Marland Kitchen levothyroxine (SYNTHROID, LEVOTHROID) 100 MCG tablet Take 100 mcg by mouth daily before breakfast.    . meloxicam (MOBIC) 15 MG tablet Take 15 mg by mouth daily as needed for pain.     . Multiple  Vitamins-Minerals (PRESERVISION/LUTEIN PO) Take 1 tablet by mouth daily.     . pimecrolimus (ELIDEL) 1 % cream Apply 1 application topically daily as needed (itching ear canal).     . Probiotic Product (PROBIOTIC DAILY PO) Take 1 tablet by mouth daily. Sometimes she will take twice a day.     . pyridOXINE (VITAMIN B-6) 100 MG tablet Take 100 mg by mouth daily.    . traMADol (ULTRAM) 50 MG tablet Take 1 tablet (  50 mg total) by mouth every 6 (six) hours as needed. (Patient taking differently: Take 50 mg by mouth every 6 (six) hours as needed for moderate pain. ) 45 tablet 5  . TURMERIC PO Take 1,000 mg by mouth daily.    . verapamil (CALAN-SR) 240 MG CR tablet Take 240 mg by mouth daily.    Marland Kitchen aspirin EC 81 MG tablet Take 1 tablet (81 mg total) by mouth daily.    Marland Kitchen gabapentin (NEURONTIN) 100 MG capsule Take 1 capsule (100 mg total) by mouth at bedtime. 30 capsule 11  . Multiple Vitamins-Minerals (AIRBORNE PO) Take by mouth daily.     No facility-administered medications prior to visit.    PAST MEDICAL HISTORY: Past Medical History:  Diagnosis Date  . Abdominal pain   . Allergic rhinitis   . Anxiety 10/03/2013  . Arthralgia 03/31/2014  . Bronchitis 09/09/2013  . Cellulitis   . Diverticulitis   . Diverticulosis   . Fatigue   . GERD (gastroesophageal reflux disease)   . Gout   . Headache   . Headache   . Hyperchloremia   . Hypercholesterolemia   . Hypertension   . Hypothyroidism   . Lumbar strain   . Major depressive disorder   . Migraine   . Osteoporosis   . Rheumatoid arthritis ([place])     PAST SURGICAL HISTORY: Past Surgical History:  Procedure Laterality Date  . CARPAL TUNNEL RELEASE     25 years ago  . CHOLECYSTECTOMY     20 yrs ago  . COLONOSCOPY    . COLONOSCOPY N/A 10/25/2014   Procedure: COLONOSCOPY;  Surgeon: Rogene Houston, MD;  Location: [facility];  Service: Endoscopy;  Laterality: N/A;  830  . ESOPHAGEAL DILATION N/A 06/17/2019   Procedure: ESOPHAGEAL  DILATION;  Surgeon: Rogene Houston, MD;  Location: [facility];  Service: Endoscopy;  Laterality: N/A;  . ESOPHAGOGASTRODUODENOSCOPY N/A 10/25/2014   Procedure: ESOPHAGOGASTRODUODENOSCOPY (EGD);  Surgeon: Rogene Houston, MD;  Location: [facility];  Service: Endoscopy;  Laterality: N/A;  . ESOPHAGOGASTRODUODENOSCOPY (EGD) WITH PROPOFOL N/A 06/17/2019   Procedure: ESOPHAGOGASTRODUODENOSCOPY (EGD) WITH PROPOFOL;  Surgeon: Rogene Houston, MD;  Location: [facility];  Service: Endoscopy;  Laterality: N/A;  130pm  . HEMORROIDECTOMY     2005  . POLYPECTOMY    . TOTAL ABDOMINAL HYSTERECTOMY     Age 44  . UPPER GASTROINTESTINAL ENDOSCOPY      FAMILY HISTORY: Family History  Problem Relation Age of Onset  . Alzheimer's disease Father   . Alzheimer's disease Sister   . Macular degeneration Sister   . Healthy Brother   . Arthritis Sister   . Arthritis Sister   . COPD Sister   . Heart disease Mother   . Seizures Sister   . Seizures Other        nephew  . Liver cancer Other   . Alcohol abuse Sister     SOCIAL HISTORY: Social History   Socioeconomic History  . Marital status: Divorced    Spouse name: Not on file  . Number of children: 0  . Years of education: Therapist, sports  . Highest education level: Not on file  Occupational History  . Not on file  Tobacco Use  . Smoking status: Former Smoker    Packs/day: 0.50    Types: Cigarettes    Quit date: 10/2016    Years since quitting: 3.3  . Smokeless tobacco: Never Used  . Tobacco comment: Quit 16 days  ago after smoking for off and on for years  Vaping Use  . Vaping Use: Never used  Substance and Sexual Activity  . Alcohol use: Yes    Alcohol/week: 0.0 standard drinks    Comment: 4x per week, 1/2 glass of wine  . Drug use: No  . Sexual activity: Not on file  Other Topics Concern  . Not on file  Social History Narrative   Patient is divorced, no children.   Patient is a retired Equities trader.   She lives at home alone,  she has a friend who stays with her some   Patient is right handed.   Patient drinks 2 cups coffee daily.   Social Determinants of Health   Financial Resource Strain:   . Difficulty of Paying Living Expenses:   Food Insecurity:   . Worried About Charity fundraiser in the Last Year:   . Arboriculturist in the Last Year:   Transportation Needs:   . Film/video editor (Medical):   Marland Kitchen Lack of Transportation (Non-Medical):   Physical Activity:   . Days of Exercise per Week:   . Minutes of Exercise per Session:   Stress:   . Feeling of Stress :   Social Connections:   . Frequency of Communication with Friends and Family:   . Frequency of Social Gatherings with Friends and Family:   . Attends Religious Services:   . Active Member of Clubs or Organizations:   . Attends Archivist Meetings:   Marland Kitchen Marital Status:   Intimate Partner Violence:   . Fear of Current or Ex-Partner:   . Emotionally Abused:   Marland Kitchen Physically Abused:   . Sexually Abused:       PHYSICAL EXAM  Vitals:   02/22/20 1507  BP: (!) 187/71  Pulse: (!) 56  Weight: 150 lb (68 kg)  Height: 5\' 6"  (1.676 m)   Body mass index is 24.21 kg/m.  Physical exam: Exam: Gen: NAD, conversant, well nourised,  well groomed                      Neuro: Detailed Neurologic Exam  Speech:    Speech is normal; fluent and spontaneous with normal comprehension.  Cognition:    The patient is oriented to person, place, and time;     recent and remote memory intact;     language fluent;     normal attention, concentration,     fund of knowledge Cranial Nerves:    The pupils are equal, round, and reactive to light. Visual fields are full to finger confrontation. Extraocular movements are intact. Trigeminal sensation is intact and the muscles of mastication are normal. The face is symmetric. The palate elevates in the midline. Hearing intact. Voice is normal. Shoulder shrug is normal. The tongue has normal motion  without fasciculations.   Coordination:    No dysmetria or ataxia   Posture:    Posture is normal. normal erect    Strength:    Strength is V/V in the upper and lower limbs.      Sensation: intact to LT     Reflex Exam:  DTR's:    Deep tendon reflexes in the upper and lower extremities are symmetrical bilaterally.   Toes:    The toes are downgoing bilaterally.   Clonus:    Clonus is absent. Marland Kitchen   DIAGNOSTIC DATA (LABS, IMAGING, TESTING) - I reviewed patient records, labs, notes, testing and imaging myself  where available.  No flowsheet data found.   No results found for: WBC, HGB, HCT, MCV, PLT    Component Value Date/Time   NA 142 02/12/2015 1445   K 5.3 (H) 02/12/2015 1445   CL 102 02/12/2015 1445   CO2 25 02/12/2015 1445   GLUCOSE 87 02/12/2015 1445   BUN 12 02/12/2015 1445   CREATININE 0.74 02/12/2015 1445   CALCIUM 10.5 (H) 02/12/2015 1445   PROT 6.6 02/12/2015 1445   ALBUMIN 4.3 02/12/2015 1445   AST 30 02/12/2015 1445   ALT 28 02/12/2015 1445   ALKPHOS 74 02/12/2015 1445   BILITOT <0.2 02/12/2015 1445   GFRNONAA 78 02/12/2015 1445   GFRAA 90 02/12/2015 1445   No results found for: CHOL, HDL, LDLCALC, LDLDIRECT, TRIG, CHOLHDL Lab Results  Component Value Date   HGBA1C 5.6 07/05/2014   Lab Results  Component Value Date   VITAMINB12 820 02/12/2015   No results found for: TSH     ASSESSMENT AND PLAN 84 y.o. year old absolutely lovely female  has a past medical history of Abdominal pain, Allergic rhinitis, Anxiety (10/03/2013), Arthralgia (03/31/2014), Bronchitis (09/09/2013), Cellulitis, Diverticulitis, Diverticulosis, Fatigue, GERD (gastroesophageal reflux disease), Gout, Headache, Headache, Hyperchloremia, Hypercholesterolemia, Hypertension, Hypothyroidism, Lumbar strain, Major depressive disorder, Migraine, Osteoporosis, and Rheumatoid arthritis ([place]).   One of the loveliest patients you will ever meet. We had a very long talk today, she was very  hesitant about sleep study however her ESS is 14 and she says that she can fall asleep everywhere and anywhere, she can fall asleep sitting straight up in a chair, I do think that a sleep evaluation would help patient.  Continue verapamil for migraine prevention, she has done very well and its helped her vestibular symptoms.  She has carpal tunnel but at this time she wants to monitor clinically, stable  Peripheral neuropathy in the feet: stable      ICD-10-CM   1. Excessive daytime sleepiness  G47.19 Ambulatory referral to Sleep Studies  2. Idiopathic small fiber peripheral neuropathy  G60.9   3. Carpal tunnel syndrome, unspecified laterality  G56.00   4. Neuropathy  G62.9   5. Vestibular migraine  G43.809       Orders Placed This Encounter  Procedures  . Ambulatory referral to Sleep Studies    Referral Priority:   Routine    Referral Type:   Consultation    Referral Reason:   Specialty Services Required    Referred to Provider:   Dohmeier, Asencion Partridge, MD    Number of Visits Requested:   1     No orders of the defined types were placed in this encounter.   I spent 40 minutes of face-to-face and non-face-to-face time with patient on the  1. Excessive daytime sleepiness   2. Idiopathic small fiber peripheral neuropathy   3. Carpal tunnel syndrome, unspecified laterality   4. Neuropathy   5. Vestibular migraine    diagnosis.  This included previsit chart review, lab review, study review, order entry, electronic health record documentation, patient education on the different diagnostic and therapeutic options, counseling and coordination of care, risks and benefits of management, compliance, or risk factor reduction   [name], MD [facility]

## 2020-02-23 ENCOUNTER — Encounter: Payer: Self-pay | Admitting: Neurology

## 2020-02-23 DIAGNOSIS — I10 Essential (primary) hypertension: Secondary | ICD-10-CM | POA: Diagnosis not present

## 2020-02-23 DIAGNOSIS — Z6824 Body mass index (BMI) 24.0-24.9, adult: Secondary | ICD-10-CM | POA: Diagnosis not present

## 2020-02-23 NOTE — Patient Instructions (Signed)
Sleep Apnea Sleep apnea is a condition in which breathing pauses or becomes shallow during sleep. Episodes of sleep apnea usually last 10 seconds or longer, and they may occur as many as 20 times an hour. Sleep apnea disrupts your sleep and keeps your body from getting the rest that it needs. This condition can increase your risk of certain health problems, including:  Heart attack.  Stroke.  Obesity.  Diabetes.  Heart failure.  Irregular heartbeat. What are the causes? There are three kinds of sleep apnea:  Obstructive sleep apnea. This kind is caused by a blocked or collapsed airway.  Central sleep apnea. This kind happens when the part of the brain that controls breathing does not send the correct signals to the muscles that control breathing.  Mixed sleep apnea. This is a combination of obstructive and central sleep apnea. The most common cause of this condition is a collapsed or blocked airway. An airway can collapse or become blocked if:  Your throat muscles are abnormally relaxed.  Your tongue and tonsils are larger than normal.  You are overweight.  Your airway is smaller than normal. What increases the risk? You are more likely to develop this condition if you:  Are overweight.  Smoke.  Have a smaller than normal airway.  Are elderly.  Are female.  Drink alcohol.  Take sedatives or tranquilizers.  Have a family history of sleep apnea. What are the signs or symptoms? Symptoms of this condition include:  Trouble staying asleep.  Daytime sleepiness and tiredness.  Irritability.  Loud snoring.  Morning headaches.  Trouble concentrating.  Forgetfulness.  Decreased interest in sex.  Unexplained sleepiness.  Mood swings.  Personality changes.  Feelings of depression.  Waking up often during the night to urinate.  Dry mouth.  Sore throat. How is this diagnosed? This condition may be diagnosed with:  A medical history.  A physical  exam.  A series of tests that are done while you are sleeping (sleep study). These tests are usually done in a sleep lab, but they may also be done at home. How is this treated? Treatment for this condition aims to restore normal breathing and to ease symptoms during sleep. It may involve managing health issues that can affect breathing, such as high blood pressure or obesity. Treatment may include:  Sleeping on your side.  Using a decongestant if you have nasal congestion.  Avoiding the use of depressants, including alcohol, sedatives, and narcotics.  Losing weight if you are overweight.  Making changes to your diet.  Quitting smoking.  Using a device to open your airway while you sleep, such as: ? An oral appliance. This is a custom-made mouthpiece that shifts your lower jaw forward. ? A continuous positive airway pressure (CPAP) device. This device blows air through a mask when you breathe out (exhale). ? A nasal expiratory positive airway pressure (EPAP) device. This device has valves that you put into each nostril. ? A bi-level positive airway pressure (BPAP) device. This device blows air through a mask when you breathe in (inhale) and breathe out (exhale).  Having surgery if other treatments do not work. During surgery, excess tissue is removed to create a wider airway. It is important to get treatment for sleep apnea. Without treatment, this condition can lead to:  High blood pressure.  Coronary artery disease.  In men, an inability to achieve or maintain an erection (impotence).  Reduced thinking abilities. Follow these instructions at home: Lifestyle  Make any lifestyle changes   that your health care provider recommends.  Eat a healthy, well-balanced diet.  Take steps to lose weight if you are overweight.  Avoid using depressants, including alcohol, sedatives, and narcotics.  Do not use any products that contain nicotine or tobacco, such as cigarettes,  e-cigarettes, and chewing tobacco. If you need help quitting, ask your health care provider. General instructions  Take over-the-counter and prescription medicines only as told by your health care provider.  If you were given a device to open your airway while you sleep, use it only as told by your health care provider.  If you are having surgery, make sure to tell your health care provider you have sleep apnea. You may need to bring your device with you.  Keep all follow-up visits as told by your health care provider. This is important. Contact a health care provider if:  The device that you received to open your airway during sleep is uncomfortable or does not seem to be working.  Your symptoms do not improve.  Your symptoms get worse. Get help right away if:  You develop: ? Chest pain. ? Shortness of breath. ? Discomfort in your back, arms, or stomach.  You have: ? Trouble speaking. ? Weakness on one side of your body. ? Drooping in your face. These symptoms may represent a serious problem that is an emergency. Do not wait to see if the symptoms will go away. Get medical help right away. Call your local emergency services (911 in the [place] Do not drive yourself to the hospital. Summary  Sleep apnea is a condition in which breathing pauses or becomes shallow during sleep.  The most common cause is a collapsed or blocked airway.  The goal of treatment is to restore normal breathing and to ease symptoms during sleep. This information is not intended to replace advice given to you by your health care provider. Make sure you discuss any questions you have with your health care provider. Document Revised: 01/05/2019 Document Reviewed: 03/16/2018 [employer] Patient Education  2020 [employer]  

## 2020-03-02 DIAGNOSIS — E7849 Other hyperlipidemia: Secondary | ICD-10-CM | POA: Diagnosis not present

## 2020-03-02 DIAGNOSIS — I10 Essential (primary) hypertension: Secondary | ICD-10-CM | POA: Diagnosis not present

## 2020-03-02 DIAGNOSIS — F3342 Major depressive disorder, recurrent, in full remission: Secondary | ICD-10-CM | POA: Diagnosis not present

## 2020-03-02 DIAGNOSIS — M0609 Rheumatoid arthritis without rheumatoid factor, multiple sites: Secondary | ICD-10-CM | POA: Diagnosis not present

## 2020-03-07 DIAGNOSIS — Z1389 Encounter for screening for other disorder: Secondary | ICD-10-CM | POA: Diagnosis not present

## 2020-03-07 DIAGNOSIS — Z6823 Body mass index (BMI) 23.0-23.9, adult: Secondary | ICD-10-CM | POA: Diagnosis not present

## 2020-03-07 DIAGNOSIS — I10 Essential (primary) hypertension: Secondary | ICD-10-CM | POA: Diagnosis not present

## 2020-03-07 DIAGNOSIS — Z1331 Encounter for screening for depression: Secondary | ICD-10-CM | POA: Diagnosis not present

## 2020-03-15 DIAGNOSIS — L255 Unspecified contact dermatitis due to plants, except food: Secondary | ICD-10-CM | POA: Diagnosis not present

## 2020-04-03 DIAGNOSIS — I10 Essential (primary) hypertension: Secondary | ICD-10-CM | POA: Diagnosis not present

## 2020-04-03 DIAGNOSIS — F3342 Major depressive disorder, recurrent, in full remission: Secondary | ICD-10-CM | POA: Diagnosis not present

## 2020-04-03 DIAGNOSIS — E7849 Other hyperlipidemia: Secondary | ICD-10-CM | POA: Diagnosis not present

## 2020-04-03 DIAGNOSIS — M0609 Rheumatoid arthritis without rheumatoid factor, multiple sites: Secondary | ICD-10-CM | POA: Diagnosis not present

## 2020-04-16 DIAGNOSIS — W57XXXA Bitten or stung by nonvenomous insect and other nonvenomous arthropods, initial encounter: Secondary | ICD-10-CM | POA: Diagnosis not present

## 2020-04-16 DIAGNOSIS — S30861A Insect bite (nonvenomous) of abdominal wall, initial encounter: Secondary | ICD-10-CM | POA: Diagnosis not present

## 2020-05-03 DIAGNOSIS — M0609 Rheumatoid arthritis without rheumatoid factor, multiple sites: Secondary | ICD-10-CM | POA: Diagnosis not present

## 2020-05-03 DIAGNOSIS — I10 Essential (primary) hypertension: Secondary | ICD-10-CM | POA: Diagnosis not present

## 2020-05-03 DIAGNOSIS — E7849 Other hyperlipidemia: Secondary | ICD-10-CM | POA: Diagnosis not present

## 2020-05-03 DIAGNOSIS — F3342 Major depressive disorder, recurrent, in full remission: Secondary | ICD-10-CM | POA: Diagnosis not present

## 2020-05-25 DIAGNOSIS — Z23 Encounter for immunization: Secondary | ICD-10-CM | POA: Diagnosis not present

## 2020-05-30 DIAGNOSIS — H43813 Vitreous degeneration, bilateral: Secondary | ICD-10-CM | POA: Diagnosis not present

## 2020-05-30 DIAGNOSIS — Z961 Presence of intraocular lens: Secondary | ICD-10-CM | POA: Diagnosis not present

## 2020-05-31 ENCOUNTER — Telehealth: Payer: Self-pay | Admitting: Neurology

## 2020-05-31 NOTE — Telephone Encounter (Signed)
Pt called wanting to speak to the provider or RN about some vision issues she has been having. Pt states she has seen her eye doctor and everything seemed to be fine. Please advise.

## 2020-05-31 NOTE — Telephone Encounter (Signed)
Called patient who stated Dr Megan Daniel did total eye exam including scan of back of eyes. Her eyes are ok; it's not macular degeneration. Prescription hasn't changed. She noticed one day last week when outside her walkway looked uneven, and the parking lot looked warped. She knows these surfaces are level. She is experiencing it today as well. Her BP is normal today, 130/70's. She's been under family stress lately, is fatigued, but sleeps well. She's asking to see Dr Megan Daniel. I reviewed symptoms of stroke which she denied. I advised will send request to Dr Megan Daniel and advised she seek medical attention over weekend if any symptoms of stroke or her current symptoms get worse. Patient verbalized understanding, appreciation.

## 2020-06-01 NOTE — Telephone Encounter (Signed)
LM on the VM for the patient to call back 

## 2020-06-01 NOTE — Telephone Encounter (Signed)
Pt was notified of the message below °

## 2020-06-01 NOTE — Telephone Encounter (Signed)
Let patient know of course we will see her. Dr. Cathren Laine nurse Romelle Starcher  will call her on Monday and get her scheduled for an appointment next week.

## 2020-06-02 DIAGNOSIS — M0609 Rheumatoid arthritis without rheumatoid factor, multiple sites: Secondary | ICD-10-CM | POA: Diagnosis not present

## 2020-06-02 DIAGNOSIS — E7849 Other hyperlipidemia: Secondary | ICD-10-CM | POA: Diagnosis not present

## 2020-06-02 DIAGNOSIS — F3342 Major depressive disorder, recurrent, in full remission: Secondary | ICD-10-CM | POA: Diagnosis not present

## 2020-06-02 DIAGNOSIS — I10 Essential (primary) hypertension: Secondary | ICD-10-CM | POA: Diagnosis not present

## 2020-06-04 NOTE — Telephone Encounter (Signed)
Appt has been scheduled for 11/2 at 2 pm.

## 2020-06-05 ENCOUNTER — Ambulatory Visit (INDEPENDENT_AMBULATORY_CARE_PROVIDER_SITE_OTHER): Payer: Medicare Other | Admitting: Neurology

## 2020-06-05 ENCOUNTER — Encounter: Payer: Self-pay | Admitting: Neurology

## 2020-06-05 VITALS — BP 158/72 | HR 76 | Ht 66.0 in | Wt 145.0 lb

## 2020-06-05 DIAGNOSIS — R2689 Other abnormalities of gait and mobility: Secondary | ICD-10-CM

## 2020-06-05 DIAGNOSIS — H499 Unspecified paralytic strabismus: Secondary | ICD-10-CM | POA: Diagnosis not present

## 2020-06-05 DIAGNOSIS — H539 Unspecified visual disturbance: Secondary | ICD-10-CM

## 2020-06-05 DIAGNOSIS — R270 Ataxia, unspecified: Secondary | ICD-10-CM | POA: Diagnosis not present

## 2020-06-05 DIAGNOSIS — W19XXXA Unspecified fall, initial encounter: Secondary | ICD-10-CM

## 2020-06-05 DIAGNOSIS — H532 Diplopia: Secondary | ICD-10-CM

## 2020-06-05 NOTE — Progress Notes (Signed)
PATIENT: Megan Daniel DOB: [birth year]  REASON FOR VISIT: follow up HISTORY FROM: patient  Chief Complaint  Patient presents with  . vision changes  . Room 3    Interval history 06/05/2020: 84 year old patient here for new vision changes. PMHx rheumatoid arthritis, osteoporosis, migraines, depression, hypothyroidism, hypertension, hypercholesterolemia, headache, gout, GERD, fatigue, anxiety, carpal tunnel syndrome (monitoring clinically), peripheral small fiber polyneuropathy, joint pain.   2 weeks ago she was walking outside on cobblestone. She walked out and it looked like some of the squares were raised up and looked uneven. She went to Dr. Edythe Lynn office and his pavement looked down, it went away and she forgot about it, then it started again this week and it is like that now. On her wood floor and this floor ir doesntt look warped or sunke. Dr. Katy Fitch did a total exam, he couldn't find anything wrong in the eyes.  It is continuous, stable throughout the day, she feels more clumsy, she took no new medication, nothing new. She has not tried to close one eye to see if her vision improves. She has her thyroid checked. She still is sleepy all day. She is still very tired. She never scheduled the sleep test, she wants a sleep test at home.   Patient complains of symptoms per HPI as well as the following symptoms:visual changes . Pertinent negatives and positives per HPI. All others negative  Interval history 02/23/2020: She get sleepy when she sit down, she goes to sleep about 10pm, she wakes up during the night between 3-4 and goes to the bathroom maybe once, she wakes up about 7am, she was suffering from depression last year and she thinks this may have affected her due to a death in her niece. She passsed away in January 2020. She can go to sleep sitting up straight. She thinks the Tumeric is helping her arthritis. She had bloodwork rcently and thyroid is normal. She does not have  migraines. The peripheral neuropathy in her feet are stable. Dr. Amedeo Plenty at [facility] when she feels it is time to treat the CTS. we had a very long talk today about sleep, sequelae of untreated sleep apnea and why I think its important for her to have a test given the extreme sleepiness she describes, she was very hesitant about sleep study however her ESS is 14 and she says that she can fall asleep everywhere and anywhere, she can fall asleep sitting straight up in a chair, I do think that a sleep evaluation would help patient. Migraines stable. Neuropathy stable. CTS symptoms stable.   Patient complains of symptoms per HPI as well as the following symptoms: sleepiness . Pertinent negatives and positives per HPI. All others negative   HISTORY OF PRESENT ILLNESS: Today 21/30/86 Megan Daniel is a 84 y.o. female here today for follow up for neuropathy and migraines. She continues verapamil for migraine management. She feels that headaches are well managed. NCS/EMG in 12/2017 showed carpal tunnel and small fiber neuropathy in feet with no evidence of worsening. She reports that many things have changed for her in 2020. She feels that the sensation has changed in her hands. She reports that she can hold a hot dish that others feel are too hard to handle. She denies burns. She does not feel that one hand is worse than the other. She uses her right more and noticed the numbness of the right hand more so than the left. She does note continued  numbness of fingers with flexed position of left wrist. She also has worsening of numbness and tingling of both feet. She has had a few falls but reports they were all "careless" as she tripped over a cord. One fall was in the kitchen. She reports falling backwards in her kitchen after getting dizzy about 1-2 years ago. She hit her head on the back of her cabinets. She was evaluated by Dr Quintin Alto and reports workup was normal but would required suturing. She also admits  to drinking wine that night. She does not feel that neuropathy is causing falls. She did complete PT last year and felt that this helped with strengthening. She has peripheral vascular disease. She is taking B complex vitamins that seem to help a little bit.   She knows that her blood pressures are higher than usual. She saw PCP in September and verapamil was increased to 240mg . She did not note improvement and dose was increased again to 360mg . She started having cramps so dose was decreased to 240mg . She has not seen PCP since. She is checking her blood pressures at home from time to time and reports that readings are 160-180/80's. She is a retired Marine scientist and states that she is trying to check her own blood pressures manually. Initial BP in office today was 180/89. Recheck following completion of visit 138/74. She does report feeling anxious today.   HISTORY: (copied from Dr Cathren Laine note on 11/19/2017)  Interval history 11/19/2017:  Patient is here for a visit, new consult and new problem, hasn't been seen in over 3 years.  Reviewed referring physician notes, patient presented with depression and chronic hypothyroidism and GERD, hypertension, arthralgias myalgias alleviated by corticosteroids and NSAIDs she is on meloxicam every day, Celexa, tramadol.  She has a past medical history of hypertension, gout, hypothyroidism, polyneuropathy, rheumatoid arthritis with rheumatoid factor, mixed hyperlipidemia, major depressive disorder. She continues to have migraines. The numbness in her hands and feet are worse. She fell and hit her head above the left eye, she had a black eye. She did not go to the emergency room. She saw Dr. Midge Aver and examined the eye.    Migraines; Chronic. She has tried multiple medications such as verapamil, celexa, meloxicam, tramadol, flexeril, prednisone, propranolol. After she fell she had a little headache, migaines are going well, she really doesn't have migraines often. Still  on the verapamil.  Neuropathy: Idiopathic possibly related to her rheumatoid arthritis. Her feet feel hot, the soles always feel hot. She doesn't know why she has fallen, she started falling backwards and couldn;t catch herslef, 2 falls in the last year. She feels her balance is poor. If she closes her eyes in the shower she feels imbalance. She has dizziness when she turns around quickly but that is not the cause of the fall. She has cut back on the wine, maybe 4 oz a few times a week, she quit smoking a little over a year ago. The burning bothers her more at night, during the day she is better some arthritc pains. She has numbness in the hands. 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 Both hands are symmetric, feet are symmetric. She has pain in one shoulder but no neck pain and no radiculopathy. She is active at home yardwork but no balancing exercises.   Hand numbness: wakes up with numbness in the hands and when she changes positions it goes away.  lab testing in the past for neuropathy included B12 and folate, methylmalonic acid, hemoglobin A1c, Lyme, lumbar puncture, Sjogren's, IFE and PE.   CMP unremarkable  HPI: Megan Daniel is a 84 y.o. female here as a follow up for headaches  Interval history: 08/16/2014. She is feeling better, no headaches. She is still on the Verapamil and no side effects. She stopped Methotrexate and she on prednisone prn, joint pain feels better.   Interval history 02/11/2014: She is feeling well. She has been on steroids for her RA.She stopped Methotrexate. No headaches. She is still on the verapamil. She takes tramadol prn. She has some chronic neuropathy that is mild.   Interval History 07/05/2014: Her headaches are improved. No real headaches since taking the prednisone and starting verapamil. She gets them possibly every once in a while, but mild. Her Verapamil pills changed colors but she  is feeling the same, still helping. No side effects from the Verapamil. She is on methotrexate for Rheumatoid Arthritis and it is helping. She sees Dr. Amil Amen. Her joint pain was terrible. Her joint pains are better after a month of methotrexate. She still reports paresthesias in her feet.   Reviewed notes, labs and imaging from outside physicians, which showed: Labs in Dr Valora Piccolo office recently showed CMP/CBC unremarkable, Hepatitis panel negative. CCP Ab negative. CRP 11, high.   Previous Appointment [id number]/3255:   84 year old female who is here for evaluation of headaches. This summer started having headaches daily. Wakes up in the morning and it starts hurting in the right frontal/parietal areas. Can't describe the pain, "just hurts". Tylenol will make it feel better. Bright lights make it worse or loud noises make it worse too. Takes tylenol daily which usually takes care of it but may start up again later in the day. She covers up the left eye and she feels better. Ice pack didn't help. 3 weeks ago started having chills and low grade fever and aching. Has Joint pain. RF+, ANA negative, sed rate normal but primary care is sending patient to rheumatology for evaluation. Recently diagnosed with lyme disease and took antibiotics. No rash. No tick seen. But had a bite and was given doxycycline. Has not had imaging of the brain. Lyme test was negative but had taken antibitoics before the bloodwork. Has had several insect and tick bites. The joint pain is migrating. Feels like her face is assymmetric, one side of her face is more droopy. Has pain in the hands, hips, hands, fingers, knees but not all at the same time. No pain in the shoulders. +fatigue, is so tiredthat even holding onto the steering wheel makes her fatigued. 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 No vision changes, no pain in the temples, no jaw claudication. Has  paresthesias in her feet, feet feel hot and she has to put a cool washcloth on them and sleep outside the covers. Headaches can be severe, 7/10.    REVIEW OF SYSTEMS: Out of a complete 14 system review of symptoms, the patient complains only of the following symptoms, fatigue, rare dizziness, memory loss and all other reviewed systems are negative.  ALLERGIES: Allergies  Allergen Reactions  . Ibuprofen Other (See Comments)    Heartburn "intolerance"    HOME MEDICATIONS: Outpatient Medications Prior to Visit  Medication Sig Dispense Refill  . B Complex-C (SUPER B COMPLEX PO) Take by mouth.    . Cholecalciferol (VITAMIN  D3 PO) Take 2,000 Units by mouth.    . famotidine (PEPCID) 40 MG tablet Take 1 tablet (40 mg total) by mouth at bedtime. (Patient taking differently: Take 40 mg by mouth at bedtime. As needed.) 90 tablet 3  . hydrochlorothiazide (HYDRODIURIL) 12.5 MG tablet Take 12.5 mg by mouth daily.    Marland Kitchen levothyroxine (SYNTHROID, LEVOTHROID) 100 MCG tablet Take 100 mcg by mouth daily before breakfast.    . LUTEIN PO Take by mouth in the morning and at bedtime.    . meloxicam (MOBIC) 15 MG tablet Take 15 mg by mouth daily as needed for pain.     . Multiple Vitamins-Minerals (PRESERVISION/LUTEIN PO) Take 1 tablet by mouth daily.     . pimecrolimus (ELIDEL) 1 % cream Apply 1 application topically daily as needed (itching ear canal).     . Probiotic Product (PROBIOTIC DAILY PO) Take 1 tablet by mouth daily. Sometimes she will take twice a day.     . pyridOXINE (VITAMIN B-6) 100 MG tablet Take 100 mg by mouth daily.    . traMADol (ULTRAM) 50 MG tablet Take 1 tablet (50 mg total) by mouth every 6 (six) hours as needed. (Patient taking differently: Take 50 mg by mouth every 6 (six) hours as needed for moderate pain. ) 45 tablet 5  . TURMERIC PO Take 1,000 mg by mouth daily.    . verapamil (CALAN-SR) 240 MG CR tablet Take 240 mg by mouth daily.    . citalopram (CELEXA) 20 MG tablet Take 20 mg  by mouth daily. (Patient not taking: Reported on 06/05/2020)    . fexofenadine (ALLEGRA) 180 MG tablet Take 180 mg by mouth as needed for allergies or rhinitis. (Patient not taking: Reported on 06/05/2020)     No facility-administered medications prior to visit.    PAST MEDICAL HISTORY: Past Medical History:  Diagnosis Date  . Abdominal pain   . Allergic rhinitis   . Anxiety 10/03/2013  . Arthralgia 03/31/2014  . Bronchitis 09/09/2013  . Cellulitis   . Diverticulitis   . Diverticulosis   . Fatigue   . GERD (gastroesophageal reflux disease)   . Gout   . Headache   . Headache   . Hyperchloremia   . Hypercholesterolemia   . Hypertension   . Hypothyroidism   . Lumbar strain   . Major depressive disorder   . Migraine   . Osteoporosis   . Rheumatoid arthritis ([place])     PAST SURGICAL HISTORY: Past Surgical History:  Procedure Laterality Date  . CARPAL TUNNEL RELEASE     25 years ago  . CHOLECYSTECTOMY     20 yrs ago  . COLONOSCOPY    . COLONOSCOPY N/A 10/25/2014   Procedure: COLONOSCOPY;  Surgeon: Rogene Houston, MD;  [facility]: [facility];  Service: Endoscopy;  Laterality: N/A;  830  . ESOPHAGEAL DILATION N/A 06/17/2019   Procedure: ESOPHAGEAL DILATION;  Surgeon: Rogene Houston, MD;  Location: [facility];  Service: Endoscopy;  Laterality: N/A;  . ESOPHAGOGASTRODUODENOSCOPY N/A 10/25/2014   Procedure: ESOPHAGOGASTRODUODENOSCOPY (EGD);  Surgeon: Rogene Houston, MD;  [facility]: [facility];  Service: Endoscopy;  Laterality: N/A;  . ESOPHAGOGASTRODUODENOSCOPY (EGD) WITH PROPOFOL N/A 06/17/2019   Procedure: ESOPHAGOGASTRODUODENOSCOPY (EGD) WITH PROPOFOL;  Surgeon: Rogene Houston, MD;  Location: [facility];  Service: Endoscopy;  Laterality: N/A;  130pm  . HEMORROIDECTOMY     2005  . POLYPECTOMY    . TOTAL ABDOMINAL HYSTERECTOMY     Age 27  .  UPPER GASTROINTESTINAL ENDOSCOPY      FAMILY HISTORY: Family History  Problem Relation Age of Onset  . Alzheimer's  disease Father   . Alzheimer's disease Sister   . Macular degeneration Sister   . Healthy Brother   . Arthritis Sister   . Arthritis Sister   . COPD Sister   . Heart disease Mother   . Seizures Sister   . Seizures Other        nephew  . Liver cancer Other   . Alcohol abuse Sister     SOCIAL HISTORY: Social History   Socioeconomic History  . Marital status: Divorced    Spouse name: Not on file  . Number of children: 0  . Years of education: Therapist, sports  . Highest education level: Not on file  Occupational History  . Not on file  Tobacco Use  . Smoking status: Former Smoker    Packs/day: 0.50    Types: Cigarettes    Quit date: 10/2016    Years since quitting: 3.6  . Smokeless tobacco: Never Used  . Tobacco comment: Quit 16 days ago after smoking for off and on for years  Vaping Use  . Vaping Use: Never used  Substance and Sexual Activity  . Alcohol use: Not Currently    Alcohol/week: 0.0 standard drinks    Comment: 4x per week, 1/2 glass of wine  . Drug use: No  . Sexual activity: Not on file  Other Topics Concern  . Not on file  Social History Narrative   Patient is divorced, no children.   Patient is a retired Equities trader.   She lives at home alone, she has a friend who stays with her some   Patient is right handed.   Patient drinks 2 cups coffee daily.   Social Determinants of Health   Financial Resource Strain:   . Difficulty of Paying Living Expenses: Not on file  Food Insecurity:   . Worried About Charity fundraiser in the Last Year: Not on file  . Ran Out of Food in the Last Year: Not on file  Transportation Needs:   . Lack of Transportation (Medical): Not on file  . Lack of Transportation (Non-Medical): Not on file  Physical Activity:   . Days of Exercise per Week: Not on file  . Minutes of Exercise per Session: Not on file  Stress:   . Feeling of Stress : Not on file  Social Connections:   . Frequency of Communication with Friends and Family: Not  on file  . Frequency of Social Gatherings with Friends and Family: Not on file  . Attends Religious Services: Not on file  . Active Member of Clubs or Organizations: Not on file  . Attends Archivist Meetings: Not on file  . Marital Status: Not on file  Intimate Partner Violence:   . Fear of Current or Ex-Partner: Not on file  . Emotionally Abused: Not on file  . Physically Abused: Not on file  . Sexually Abused: Not on file   Physical exam: Exam: Gen: NAD, conversant, well nourised, obese, well groomed                     CV: RRR, no MRG. No Carotid Bruits. No peripheral edema, warm, nontender Eyes: Conjunctivae clear without exudates or hemorrhage  Neuro: Detailed Neurologic Exam  Speech:    Speech is normal; fluent and spontaneous with normal comprehension.  Cognition:    The patient is oriented  to person, place, and time;   Cranial Nerves:    The pupils are equal, round, and reactive to light. Attempted fundoscopy could not visualize.. Visual fields are full to finger confrontation. Extraocular movements are intact. Trigeminal sensation is intact and the muscles of mastication are normal. The face is symmetric. The palate elevates in the midline. Hearing intact. Voice is normal. Shoulder shrug is normal. The tongue has normal motion without fasciculations.   Coordination:    Normal finger to nose and heel to shin.   Gait:    Not ataxic or shuffling  Motor Observation:    No asymmetry, no atrophy, and no involuntary movements noted. Tone:    Normal muscle tone.    Posture:    Posture is normal. normal erect    Strength:    Strength is symmetrical in the upper and lower limbs. Some mild weakness in the triceps but non focal, likely due to disuse.      Sensation: intact to LT     Reflex Exam:  DTR's:    Deep tendon reflexes in the upper and lower extremities are symmetrical bilaterally. Trace AJs, 1+ patellars and biceps Toes:    The toes are downgoing  bilaterally.   Clonus:    Clonus is absent. Marland Kitchen   DIAGNOSTIC DATA (LABS, IMAGING, TESTING) - I reviewed patient records, labs, notes, testing and imaging myself where available.  No flowsheet data found.   No results found for: WBC, HGB, HCT, MCV, PLT    Component Value Date/Time   NA 142 02/12/2015 1445   K 5.3 (H) 02/12/2015 1445   CL 102 02/12/2015 1445   CO2 25 02/12/2015 1445   GLUCOSE 87 02/12/2015 1445   BUN 12 02/12/2015 1445   CREATININE 0.74 02/12/2015 1445   CALCIUM 10.5 (H) 02/12/2015 1445   PROT 6.6 02/12/2015 1445   ALBUMIN 4.3 02/12/2015 1445   AST 30 02/12/2015 1445   ALT 28 02/12/2015 1445   ALKPHOS 74 02/12/2015 1445   BILITOT <0.2 02/12/2015 1445   GFRNONAA 78 02/12/2015 1445   GFRAA 90 02/12/2015 1445   No results found for: CHOL, HDL, LDLCALC, LDLDIRECT, TRIG, CHOLHDL Lab Results  Component Value Date   HGBA1C 5.6 07/05/2014   Lab Results  Component Value Date   VITAMINB12 820 02/12/2015   No results found for: TSH     ASSESSMENT AND PLAN 84 y.o. year old absolutely lovely female  has a past medical history of Abdominal pain, Allergic rhinitis, Anxiety (10/03/2013), Arthralgia (03/31/2014), Bronchitis (09/09/2013), Cellulitis, Diverticulitis, Diverticulosis, Fatigue, GERD (gastroesophageal reflux disease), Gout, Headache, Headache, Hyperchloremia, Hypercholesterolemia, Hypertension, Hypothyroidism, Lumbar strain, Major depressive disorder, Migraine, Osteoporosis, and Rheumatoid arthritis ([place]).   One of the loveliest patients you will ever meet. We had a very long talk today, she was very hesitant about sleep study however her ESS is 14 and she says that she can fall asleep everywhere and anywhere, she can fall asleep sitting straight up in a chair, I do think that a sleep evaluation would help patient. I connected her with our sleep staf to make her an appointment.  She is having what appears to be diplopia, or visual deficits. I cannot localize a  cranial nerve, either CN3 or CN4 because it appears to be worse shorter distances, I walked outside and found a spot that she felt has the visual changes, closing either eye helped, tilting head to either side maybe helped unsure). We will get MRI of the brain as well as thin  cuts through the orbits to look for strokes, masses, demyelination or other. She is having no headaches or temple pain or jaw claudication and no vision loss so I do not think she has risk of temporal arteritis, highly unlikely. Her vision appears to be more depth perception, hard to know or get an answer from patient. She also feels less balanced and had a fall.  Will image brain with thin cuts through orbits.  Continue verapamil for migraine prevention, she has done very well and its helped her vestibular symptoms.  She has carpal tunnel but at this time she wants to monitor clinically, stable  Peripheral neuropathy in the feet: stable  Orders Placed This Encounter  Procedures  . MR BRAIN W WO CONTRAST  . MR ORBITS W WO CONTRAST  . Acetylcholine receptor, binding  . Acetylcholine receptor, blocking  . Acetylcholine receptor, modulating  . Comprehensive metabolic panel  . CBC with Differential/Platelets         ICD-10-CM   1. Diplopia  H53.2 Acetylcholine receptor, binding    Acetylcholine receptor, blocking    Acetylcholine receptor, modulating    Comprehensive metabolic panel    CBC with Differential/Platelets    MR BRAIN W WO CONTRAST    MR ORBITS W WO CONTRAST  2. Vision changes  H53.9 Acetylcholine receptor, binding    Acetylcholine receptor, blocking    Acetylcholine receptor, modulating    Comprehensive metabolic panel    CBC with Differential/Platelets    MR BRAIN W WO CONTRAST    MR ORBITS W WO CONTRAST  3. Imbalance  R26.89 MR BRAIN W WO CONTRAST    MR ORBITS W WO CONTRAST  4. Fall, initial encounter  W19.XXXA MR BRAIN W WO CONTRAST    MR ORBITS W WO CONTRAST  5. Ataxia  R27.0 MR BRAIN W WO  CONTRAST    MR ORBITS W WO CONTRAST  6. Ophthalmoplegia  H49.9 MR BRAIN W WO CONTRAST    MR ORBITS W WO CONTRAST      Orders Placed This Encounter  Procedures  . MR BRAIN W WO CONTRAST    Standing Status:   Future    Standing Expiration Date:   12/03/2020    Order Specific Question:   If indicated for the ordered procedure, I authorize the administration of contrast media per Radiology protocol    Answer:   Yes    Order Specific Question:   What is the patient's sedation requirement?    Answer:   No Sedation    Order Specific Question:   Does the patient have a pacemaker or implanted devices?    Answer:   No    Order Specific Question:   Radiology Contrast Protocol - do NOT remove file path    Answer:   [email or web address]    Order Specific Question:   Preferred imaging location?    Answer:   External  . MR ORBITS W WO CONTRAST    Standing Status:   Future    Standing Expiration Date:   06/05/2021    Order Specific Question:   GRA to provide read?    Answer:   Yes    Order Specific Question:   If indicated for the ordered procedure, I authorize the administration of contrast media per Radiology protocol    Answer:   Yes    Order Specific Question:   What is the patient's sedation requirement?    Answer:   No Sedation    Order Specific Question:  Does the patient have a pacemaker or implanted devices?    Answer:   No    Order Specific Question:   Preferred imaging location?    Answer:   External  . Acetylcholine receptor, binding  . Acetylcholine receptor, blocking  . Acetylcholine receptor, modulating  . Comprehensive metabolic panel  . CBC with Differential/Platelets     No orders of the defined types were placed in this encounter.   I spent 45 minutes of face-to-face and non-face-to-face time with patient on the  1. Diplopia   2. Vision changes   3. Imbalance   4. Fall, initial encounter   5. Ataxia   6. Ophthalmoplegia     diagnosis.  This included previsit chart review, lab review, study review, order entry, electronic health record documentation, patient education on the different diagnostic and therapeutic options, counseling and coordination of care, risks and benefits of management, compliance, or risk factor reduction   [name], MD [facility]

## 2020-06-06 ENCOUNTER — Telehealth: Payer: Self-pay | Admitting: Neurology

## 2020-06-06 NOTE — Telephone Encounter (Signed)
Medicare/bcbs supp order sent to GI. No auth they will reach out to the patient to schedule.  

## 2020-06-22 ENCOUNTER — Ambulatory Visit
Admission: RE | Admit: 2020-06-22 | Discharge: 2020-06-22 | Disposition: A | Discharge status: Home / Self Care | Payer: Medicare Other | Source: Ambulatory Visit | Attending: Neurology | Admitting: Neurology

## 2020-06-22 ENCOUNTER — Other Ambulatory Visit: Payer: Self-pay

## 2020-06-22 DIAGNOSIS — H499 Unspecified paralytic strabismus: Secondary | ICD-10-CM

## 2020-06-22 DIAGNOSIS — R270 Ataxia, unspecified: Secondary | ICD-10-CM

## 2020-06-22 DIAGNOSIS — R2689 Other abnormalities of gait and mobility: Secondary | ICD-10-CM

## 2020-06-22 DIAGNOSIS — W19XXXA Unspecified fall, initial encounter: Secondary | ICD-10-CM

## 2020-06-22 DIAGNOSIS — H539 Unspecified visual disturbance: Secondary | ICD-10-CM

## 2020-06-22 DIAGNOSIS — H532 Diplopia: Secondary | ICD-10-CM

## 2020-06-22 MED ORDER — GADOBENATE DIMEGLUMINE 529 MG/ML IV SOLN
15.0000 mL | Freq: Once | INTRAVENOUS | Status: AC | PRN
  Administered 2020-06-22: 15 mL via INTRAVENOUS

## 2020-06-25 LAB — CBC WITH DIFFERENTIAL/PLATELET
Basophils Absolute: 0.1 10*3/uL (ref 0.0–0.2)
Basos: 1 %
EOS (ABSOLUTE): 0.1 10*3/uL (ref 0.0–0.4)
Eos: 1 %
Hematocrit: 40.7 % (ref 34.0–46.6)
Hemoglobin: 13.7 g/dL (ref 11.1–15.9)
Immature Grans (Abs): 0 10*3/uL (ref 0.0–0.1)
Immature Granulocytes: 0 %
Lymphocytes Absolute: 4.6 10*3/uL — ABNORMAL HIGH (ref 0.7–3.1)
Lymphs: 52 %
MCH: 30.8 pg (ref 26.6–33.0)
MCHC: 33.7 g/dL (ref 31.5–35.7)
MCV: 92 fL (ref 79–97)
Monocytes Absolute: 0.4 10*3/uL (ref 0.1–0.9)
Monocytes: 4 %
Neutrophils Absolute: 3.7 10*3/uL (ref 1.4–7.0)
Neutrophils: 42 %
Platelets: 199 10*3/uL (ref 150–450)
RBC: 4.45 x10E6/uL (ref 3.77–5.28)
RDW: 12.6 % (ref 11.7–15.4)
WBC: 8.9 10*3/uL (ref 3.4–10.8)

## 2020-06-25 LAB — COMPREHENSIVE METABOLIC PANEL
ALT: 15 IU/L (ref 0–32)
AST: 21 IU/L (ref 0–40)
Albumin/Globulin Ratio: 1.9 (ref 1.2–2.2)
Albumin: 4.7 g/dL — ABNORMAL HIGH (ref 3.6–4.6)
Alkaline Phosphatase: 85 IU/L (ref 44–121)
BUN/Creatinine Ratio: 18 (ref 12–28)
BUN: 13 mg/dL (ref 8–27)
Bilirubin Total: 0.3 mg/dL (ref 0.0–1.2)
CO2: 26 mmol/L (ref 20–29)
Calcium: 10.5 mg/dL — ABNORMAL HIGH (ref 8.7–10.3)
Chloride: 106 mmol/L (ref 96–106)
Creatinine, Ser: 0.74 mg/dL (ref 0.57–1.00)
GFR calc Af Amer: 87 mL/min/{1.73_m2} (ref >59)
GFR calc non Af Amer: 76 mL/min/{1.73_m2} (ref >59)
Globulin, Total: 2.5 g/dL (ref 1.5–4.5)
Glucose: 95 mg/dL (ref 65–99)
Potassium: 4.7 mmol/L (ref 3.5–5.2)
Sodium: 143 mmol/L (ref 134–144)
Total Protein: 7.2 g/dL (ref 6.0–8.5)

## 2020-06-25 LAB — ACETYLCHOLINE RECEPTOR, BLOCKING: Acetylchol Block Ab: 16 % (ref 0–25)

## 2020-06-25 LAB — ACETYLCHOLINE RECEPTOR, BINDING: AChR Binding Ab, Serum: <0.03 nmol/L (ref 0.00–0.24)

## 2020-06-25 LAB — ACHR RECEPTOR MODULATING AB

## 2020-06-26 ENCOUNTER — Other Ambulatory Visit: Payer: Medicare Other

## 2020-06-26 ENCOUNTER — Telehealth: Payer: Self-pay | Admitting: Neurology

## 2020-06-26 NOTE — Telephone Encounter (Signed)
°  MRI of the brain and eyes is normal for age, thanks Dr. Jaynee Eagles  Written by Melvenia Beam, MD on 06/24/2020  2:39 PM EST

## 2020-06-26 NOTE — Telephone Encounter (Signed)
Pt. would like MRI results. Please advise.

## 2020-06-26 NOTE — Telephone Encounter (Signed)
Called pt and advised her of the results of MRI brain and orbits. Pt verbalized understanding. She mentioned her BP is about normal in the morning and then elevates during the day. She will call Dr. Quintin Alto to discuss. She also stated she would go ahead and make an appointment for her carpal tunnel. She verbalized appreciation for the call.

## 2020-07-04 ENCOUNTER — Ambulatory Visit (INDEPENDENT_AMBULATORY_CARE_PROVIDER_SITE_OTHER): Payer: Medicare Other | Admitting: Neurology

## 2020-07-04 ENCOUNTER — Encounter: Payer: Self-pay | Admitting: Neurology

## 2020-07-04 VITALS — BP 142/72 | HR 67 | Ht 66.0 in | Wt 142.0 lb

## 2020-07-04 DIAGNOSIS — R1310 Dysphagia, unspecified: Secondary | ICD-10-CM | POA: Diagnosis not present

## 2020-07-04 DIAGNOSIS — K219 Gastro-esophageal reflux disease without esophagitis: Secondary | ICD-10-CM | POA: Diagnosis not present

## 2020-07-04 DIAGNOSIS — R0683 Snoring: Secondary | ICD-10-CM

## 2020-07-04 DIAGNOSIS — G609 Hereditary and idiopathic neuropathy, unspecified: Secondary | ICD-10-CM

## 2020-07-04 DIAGNOSIS — G4722 Circadian rhythm sleep disorder, advanced sleep phase type: Secondary | ICD-10-CM

## 2020-07-04 DIAGNOSIS — R682 Dry mouth, unspecified: Secondary | ICD-10-CM | POA: Diagnosis not present

## 2020-07-04 NOTE — Progress Notes (Signed)
SLEEP MEDICINE CLINIC    Provider:  Larey Seat, MD  Primary Care Physician:  Manon Hilding, MD [place] [address]     Referring Provider: Dr Jaynee Eagles           Chief Complaint according to patient   Patient presents with:    . New Patient (Initial Visit)           HISTORY OF PRESENT ILLNESS:  Megan Daniel is a 84 y.o. year old White or Caucasian female patient seen here upon a referral on 07/04/2020 from Dr. Jaynee Eagles. Chief concern according to patient : \   I have the pleasure of seeing Megan Daniel today, a right-handed White or Caucasian female with a possible sleep disorder.  She   has a past medical history of Abdominal pain, Allergic rhinitis, Anxiety (10/03/2013), Arthralgia (03/31/2014), Bronchitis (09/09/2013), Cellulitis, Diverticulitis, Diverticulosis, Fatigue, GERD (gastroesophageal reflux disease), Gout, carpal tunnel,  Hyperchloremia, Hypercholesterolemia, Hypertension, Hypothyroidism, Lumbar strain, Major depressive disorder, Migraine, Osteoporosis, and Rheumatoid arthritis ([place]).   Megan Daniel reports that she had changes in her vision and to her a smooth surface would present as having dips and valleys on even this kind of changing lines in her vision the distortion in her vision was new and her ophthalmologist could not explain its and central therefore for neurologic examination which she had with Dr. Lavell Anchors on 10-28 2021.  She states that she never truly had diplopia.  She also never had a sleep study but she is here today because she feels more fatigued.  Without her coffee in the morning time she states she does does not feel good at all.  With her coffee however she is not excessively daytime sleepy daytime sleepy.  She does report multiple wake up spells during the night and she will go to the bathroom maybe once.  She is seems to struggle however with sometimes the events irresistible urge to go to sleep.  She does have a history of  migraines- when she saw dr Jaynee Eagles she had a headache for 3 month straights- she was seen 5-6 years ago. Her  blood work was recently normal thyroid test was normal/ peripheral neuropathy has been long known and is stable.    Sleep relevant medical history: urge incontinence, has leaking at night-. No sleep walking, no Tonsillectomy.  No other family member on CPAP with OSA, she is living alone, has no children. Has a cat and a dog with her. Insomnia- yes, she treats her insomnia with buttermilk(!) due to tryptophane. She feels unsettled by the current political situation and the pandemic, but denies depression.    Social history: she was the youngest of 5 daughters, grew up in [place], father die at 25 , mother at age 69. She was married- divorced, not remarried-  childless. 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 Tobacco use; quit in 2016.   ETOH use;  rare  ,  Caffeine intake in form of Coffee( 2 cups in AM ) Soda( /) Tea ( only when eating out , never after 3 PM) or energy drinks. Regular exercise in form of walking. .   Hobbies Suzan Garibaldi painting.     Sleep habits are as follows: The patient's dinner time is between 5-7 PM.  The patient goes to bed in Winter between 8-9 PM, working on [employer],  and continues to sleep for 4-5 hours,  wakes for her only bathroom break. Sometimes she cannot go back to sleep, in that case she puzzles and takes buttermilk.   The preferred sleep position is on the  left side , with the support of 2 pillows. This helps with GERD, too.  Dreams are reportedly frequent/vivid and she dreams sometimes she is in danger-  6-7 AM is the usual rise time.  The patient wakes up spontaneously, she can set her inner clock.Marland Kitchen  He/ She reports not feeling refreshed or restored in AM, with symptoms such as dry mouth , but rarely morning headaches, and residual fatigue. Naps are taken frequently after a meal- if she  sits still- these lasting from 20-30 minutes and are refreshing.  Review of Systems: Out of a complete 14 system review, the patient complains of only the following symptoms, and all other reviewed systems are negative.:  Fatigue, sleepiness , snoring, fragmented sleep, Insomnia after early morning arousals.    How likely are you to doze in the following situations: 0 = not likely, 1 = slight chance, 2 = moderate chance, 3 = high chance   Sitting and Reading? Watching Television? Sitting inactive in a public place (theater or meeting)? As a passenger in a car for an hour without a break? Lying down in the afternoon when circumstances permit? Sitting and talking to someone? Sitting quietly after lunch without alcohol? In a car, while stopped for a few minutes in traffic?   Total = 12/ 24 points   FSS endorsed at 9/ 63 points.   Social History   Socioeconomic History  . Marital status: Divorced    Spouse name: Not on file  . Number of children: 0  . Years of education: Therapist, sports  . Highest education level: Not on file  Occupational History  . Not on file  Tobacco Use  . Smoking status: Former Smoker    Packs/day: 0.50    Types: Cigarettes    Quit date: 10/2016    Years since quitting: 3.7  . Smokeless tobacco: Never Used  . Tobacco comment: Quit 16 days ago after smoking for off and on for years  Vaping Use  . Vaping Use: Never used  Substance and Sexual Activity  . Alcohol use: Not Currently    Alcohol/week: 0.0 standard drinks    Comment: 4x per week, 1/2 glass of wine  . Drug use: No  . Sexual activity: Not on file  Other Topics Concern  . Not on file  Social History Narrative   Patient is divorced, no children.   Patient is a retired Equities trader.   She lives at home alone, she has a friend who stays with her some   Patient is right handed.   Patient drinks 2 cups coffee daily.   Social Determinants of Health   Financial Resource Strain:   . Difficulty of  Paying Living Expenses: Not on file  Food Insecurity:   . Worried About Charity fundraiser in the Last Year: Not on file  . Ran Out of Food in the Last Year: Not on file  Transportation Needs:   . Lack of Transportation (Medical): Not on file  . Lack of Transportation (Non-Medical): Not on file  Physical Activity:   . Days of Exercise per Week: Not on file  . Minutes of Exercise per Session: Not on file  Stress:   . Feeling of Stress : Not on file  Social Connections:   . Frequency of Communication with Friends and Family:  Not on file  . Frequency of Social Gatherings with Friends and Family: Not on file  . Attends Religious Services: Not on file  . Active Member of Clubs or Organizations: Not on file  . Attends Archivist Meetings: Not on file  . Marital Status: Not on file    Family History  Problem Relation Age of Onset  . Alzheimer's disease Father   . Alzheimer's disease Sister   . Macular degeneration Sister   . Healthy Brother   . Arthritis Sister   . Arthritis Sister   . COPD Sister   . Heart disease Mother   . Seizures Sister   . Seizures Other        nephew  . Liver cancer Other   . Alcohol abuse Sister     Past Medical History:  Diagnosis Date  . Abdominal pain   . Allergic rhinitis   . Anxiety 10/03/2013  . Arthralgia 03/31/2014  . Bronchitis 09/09/2013  . Cellulitis   . Diverticulitis   . Diverticulosis   . Fatigue   . GERD (gastroesophageal reflux disease)   . Gout   . Headache   . Headache   . Hyperchloremia   . Hypercholesterolemia   . Hypertension   . Hypothyroidism   . Lumbar strain   . Major depressive disorder   . Migraine   . Osteoporosis   . Rheumatoid arthritis [facility])     Past Surgical History:  Procedure Laterality Date  . CARPAL TUNNEL RELEASE     25 years ago  . CHOLECYSTECTOMY     20 yrs ago  . COLONOSCOPY    . COLONOSCOPY N/A 10/25/2014   Procedure: COLONOSCOPY;  Surgeon: Rogene Houston, MD;  [facility];  Service: Endoscopy;  Laterality: N/A;  830  . ESOPHAGEAL DILATION N/A 06/17/2019   Procedure: ESOPHAGEAL DILATION;  Surgeon: Rogene Houston, MD;  Location: [facility];  Service: Endoscopy;  Laterality: N/A;  . ESOPHAGOGASTRODUODENOSCOPY N/A 10/25/2014   Procedure: ESOPHAGOGASTRODUODENOSCOPY (EGD);  Surgeon: Rogene Houston, MD;  [facility];  Service: Endoscopy;  Laterality: N/A;  . ESOPHAGOGASTRODUODENOSCOPY (EGD) WITH PROPOFOL N/A 06/17/2019   Procedure: ESOPHAGOGASTRODUODENOSCOPY (EGD) WITH PROPOFOL;  Surgeon: Rogene Houston, MD;  Location[facility];  Service: Endoscopy;  Laterality: N/A;  130pm  . HEMORROIDECTOMY     2005  . POLYPECTOMY    . TOTAL ABDOMINAL HYSTERECTOMY     Age 33  . UPPER GASTROINTESTINAL ENDOSCOPY       Current Outpatient Medications on File Prior to Visit  Medication Sig Dispense Refill  . B Complex-C (SUPER B COMPLEX PO) Take by mouth.    . Cholecalciferol (VITAMIN D3 PO) Take 2,000 Units by mouth.    . famotidine (PEPCID) 40 MG tablet Take 40 mg by mouth daily as needed for heartburn or indigestion.    . hydrochlorothiazide (HYDRODIURIL) 12.5 MG tablet Take 12.5 mg by mouth daily.    Marland Kitchen levothyroxine (SYNTHROID, LEVOTHROID) 100 MCG tablet Take 100 mcg by mouth daily before breakfast.    . LUTEIN PO Take by mouth in the morning and at bedtime.    . meloxicam (MOBIC) 15 MG tablet Take 15 mg by mouth daily as needed for pain.     . Multiple Vitamins-Minerals (PRESERVISION/LUTEIN PO) Take 1 tablet by mouth daily.     . pimecrolimus (ELIDEL) 1 % cream Apply 1 application topically daily as needed (itching ear canal).     . Probiotic Product (  PROBIOTIC DAILY PO) Take 1 tablet by mouth daily. Sometimes she will take twice a day.     . pyridOXINE (VITAMIN B-6) 100 MG tablet Take 100 mg by mouth daily.    . traMADol (ULTRAM) 50 MG tablet Take 1 tablet (50 mg total) by mouth every 6 (six) hours as needed. (Patient taking differently: Take  50 mg by mouth every 6 (six) hours as needed for moderate pain. ) 45 tablet 5  . TURMERIC PO Take 1,000 mg by mouth daily.    . verapamil (CALAN-SR) 240 MG CR tablet Take 240 mg by mouth daily.     No current facility-administered medications on file prior to visit.    Allergies  Allergen Reactions  . Ibuprofen Other (See Comments)    Heartburn "intolerance"    Physical exam:  Today's Vitals   07/04/20 1114  BP: (!) 142/72  Pulse: 67  Weight: 142 lb (64.4 kg)  Height: 5\' 6"  (1.676 m)   Body mass index is 22.92 kg/m.   Wt Readings from Last 3 Encounters:  07/04/20 142 lb (64.4 kg)  06/05/20 145 lb (65.8 kg)  02/22/20 150 lb (68 kg)     Ht Readings from Last 3 Encounters:  07/04/20 5\' 6"  (1.676 m)  06/05/20 5\' 6"  (1.676 m)  02/22/20 5\' 6"  (1.676 m)      General: The patient is awake, alert and appears not in acute distress. The patient is well groomed. Head: Normocephalic, atraumatic. Neck is supple. Mallampati 2,  neck circumference:14.25  inches . Nasal airflow  patent.  Retrognathia is not seen.  Dental status: intact Cardiovascular:  Regular rate and cardiac rhythm by pulse,  without distended neck veins. Respiratory: Lungs are clear to auscultation.  Skin:  Without evidence of ankle edema, or rash. Trunk: The patient's posture is erect.   Neurologic exam : The patient is awake and alert, oriented to place and time.   Memory subjective described as intact.  Attention span & concentration ability appears normal.  Speech is fluent,  without  dysarthria, dysphonia or aphasia.  Mood and affect are appropriate.   Cranial nerves: no loss of smell or taste reported  Pupils are equal and briskly reactive to light. Funduscopic exam deferred. .  Extraocular movements in vertical and horizontal planes were intact and without nystagmus. No Diplopia. Visual fields by finger perimetry are intact. Hearing was intact to soft voice and finger rubbing. Facial sensation  intact to fine touch. Facial motor strength is symmetric and tongue and uvula move midline.  Neck ROM : rotation, tilt and flexion extension were normal for age and shoulder shrug was symmetrical.    Motor exam: Symmetric bulk, tone and ROM.   Normal tone without cog- wheeling, symmetric grip strength .   Sensory:  Fine touch, pinprick and vibration were tested  and  normal.  Proprioception tested in the upper extremities was normal.   Coordination: Rapid alternating movements in the fingers/hands were of normal speed.  The Finger-to-nose maneuver was intact without evidence of ataxia, dysmetria or tremor.   Gait and station: Patient could rise unassisted from a seated position, walked without assistive device.  Stance is of normal width/ base -the patient turned with 4 steps.  Toe and heel walk were deferred.  Deep tendon reflexes: in the upper and lower extremities are symmetric and intact.  Babinski response was deferred.        After spending a total time of  40 minutes face to face and additional  time for physical and neurologic examination, review of laboratory studies,  personal review of imaging studies, reports and results of other testing and review of referral information / records as far as provided in visit, I have established the following assessments:  1) I don't see a high risk for sleep apnea, but her insomnia is related to going to bed so very early during the winter. By 8 or 9 Pm and sleeping 7 hours is enough for her. There is some daytime sleepiness, some fatigue.  2) She has reportedly smoked until 2016-17, and that may increase her risk. 3) she feels her memory is a little slowed.    My Plan is to proceed with:  1) HST or PSG fro screening.  I would like to thank  Manon Hilding, Md [place],  [place] [address] for allowing me to meet with and to take care of this pleasant patient.   In short, Megan Daniel is presenting with early morning arousals, a  symptom that can be attributed to early sleep phase, waking between 3-4 AM and going to bed between 8-9. She lives alone , her only indication for snoring is a dry mouth. .   I plan to follow up either personally or through our NP within 2-3 month.    Electronically signed by: Larey Seat, MD 07/04/2020 11:23 AM  [facility] Board certified by The [employer] of Sleep Medicine and Diplomate of the [employer] of Sleep Medicine. Board certified In Neurology through the [place], Fellow of the [employer] of Neurology. Medical Director of [facility].

## 2020-07-04 NOTE — Patient Instructions (Signed)

## 2020-07-05 ENCOUNTER — Telehealth: Payer: Self-pay

## 2020-07-05 NOTE — Telephone Encounter (Signed)
LVM for pt to call me back to schedule sleep study  

## 2020-07-18 ENCOUNTER — Other Ambulatory Visit: Payer: Self-pay

## 2020-07-18 ENCOUNTER — Ambulatory Visit (INDEPENDENT_AMBULATORY_CARE_PROVIDER_SITE_OTHER): Payer: Medicare Other | Admitting: Neurology

## 2020-07-18 DIAGNOSIS — R682 Dry mouth, unspecified: Secondary | ICD-10-CM

## 2020-07-18 DIAGNOSIS — G4733 Obstructive sleep apnea (adult) (pediatric): Secondary | ICD-10-CM | POA: Diagnosis not present

## 2020-07-18 DIAGNOSIS — R1310 Dysphagia, unspecified: Secondary | ICD-10-CM

## 2020-07-18 DIAGNOSIS — R0683 Snoring: Secondary | ICD-10-CM

## 2020-07-18 DIAGNOSIS — K219 Gastro-esophageal reflux disease without esophagitis: Secondary | ICD-10-CM

## 2020-07-18 DIAGNOSIS — G4722 Circadian rhythm sleep disorder, advanced sleep phase type: Secondary | ICD-10-CM

## 2020-07-18 DIAGNOSIS — G609 Hereditary and idiopathic neuropathy, unspecified: Secondary | ICD-10-CM

## 2020-07-20 MED ORDER — TRAZODONE HCL 50 MG PO TABS: 25.0000 mg | ORAL_TABLET | Freq: Every evening | ORAL | 1 refills | Status: DC | PRN

## 2020-07-20 NOTE — Addendum Note (Signed)
Addended by: Larey Seat on: 07/20/2020 03:56 PM   Modules accepted: Orders

## 2020-07-20 NOTE — Procedures (Signed)
PATIENT'S NAME:  Megan Daniel DOB:      [id number]      MR#:    [id number]     DATE OF RECORDING: 07/18/2020 M.Ronni Rumble M.D.:  [name] MD Study Performed:   Baseline Polysomnogram HISTORY:  Megan Daniel is a right-handed White or Caucasian female with a past medical history of Abdominal pain, Allergic rhinitis, Anxiety (10/03/2013), Arthralgia (03/31/2014), Bronchitis (09/09/2013), Cellulitis, Diverticulitis, Diverticulosis, Fatigue, GERD (gastroesophageal reflux disease), Gout, carpal tunnel, Hyperchloremia, Hypercholesterolemia, Hypertension, Hypothyroidism, Lumbar strain, Major depressive disorder, Migraine, Osteoporosis, and Rheumatoid arthritis ([place]).   Megan Daniel reports that she had changes in her vision - her perception of a smooth surface would present as having dips and valleys. This kind of changing lines affecting her ability to read, the distortion in her vision was new and her ophthalmologist could not explain its and central therefore for neurologic examination which she had with Dr. Jaynee Eagles on 10-28 2021.  She states that she never truly had diplopia.  She also never had a sleep study but she is here today because she feels more fatigued.  Without her coffee in the morning, she states she does not feel good at all.  She does report multiple wake up spells during the night and she will go to the bathroom maybe once.  She seems to struggle sometimes with the irresistible urge to go to sleep.  She does have a history of migraines- when she saw Dr Jaynee Eagles after she had a headache for 3 month Her blood work was recently normal thyroid test was normal/ peripheral neuropathy has been long known and is stable.  The patient endorsed the Epworth Sleepiness Scale at 12 points.   The patient's weight 142 pounds with a height of 66 (inches), resulting in a BMI of 22.7 kg/m2. The patient's neck circumference measured 14.2 inches.  CURRENT MEDICATIONS: B Complex C, Vitamin D3,  Pepcid, Hydrodiuril, Synthroid, Lutein, Mobic, Multivitamins-minerals, Probiotic, Vitamin B6, Ultram, Turmeric, Calan-SR   PROCEDURE:  This is a multichannel digital polysomnogram utilizing the Somnostar 11.2 system.  Electrodes and sensors were applied and monitored per AASM Specifications.   EEG, EOG, Chin and Limb EMG, were sampled at 200 Hz.  ECG, Snore and Nasal Pressure, Thermal Airflow, Respiratory Effort, CPAP Flow and Pressure, Oximetry was sampled at 50 Hz. Digital video and audio were recorded.      BASELINE STUDY: Lights Out was at 21:49 and Lights On at 04:59.  Total recording time (TRT) was 430.5 minutes, with a total sleep time (TST) of 224 minutes.   The patient's sleep latency was 41 minutes.  REM latency was 375.5 minutes.  The sleep efficiency was 52.1 %.     SLEEP ARCHITECTURE: WASO (Wake after sleep onset) was 181.5 minutes.  There were 14 minutes in Stage N1, 97 minutes Stage N2, 83 minutes Stage N3 and 30 minutes in Stage REM.  The percentage of Stage N1 was 6.25%, Stage N2 was 43.3%, Stage N3 was 37.1% and Stage R (REM sleep) was 13.4%.   RESPIRATORY ANALYSIS:  There were a total of 63 respiratory events:  50 obstructive apneas, 1 central apneas and 0 mixed apneas with a total of 51 apneas and an apnea index (AI) of 13.7 /hour. There were 12 hypopneas with a hypopnea index of 3.2 /hour. The patient also had 0 respiratory event related arousals (RERAs).      The total APNEA/HYPOPNEA INDEX (AHI) was 16.9/hour.  31 events occurred in REM sleep and 24 events  in NREM. The REM AHI was 62 /hour, versus a non-REM AHI of 9.9. The patient spent 81 minutes of total sleep time in the supine position and 143 minutes in non-supine. The supine AHI was 45.9/h versus a non-supine AHI of 0.4.  OXYGEN SATURATION & C02:  The Wake baseline 02 saturation was 96%, with the lowest being 67%. Time spent below 89% saturation equaled 4 minutes.  The arousals were noted as: 48 were spontaneous, 0 were  associated with PLMs, 32 were associated with respiratory events. The patient had a total of 0 Periodic Limb Movements.   Audio and video analysis did not show any abnormal or unusual movements, behaviors, phonations or vocalizations.   Snoring was noted during REM sleep, as were brief periods of desaturation in oxygen. EKG with ectopic beats, otherwise normal sinus rhythm (NSR).   IMPRESSION:  1. Mild Obstructive Sleep Apnea (OSA), yet a major factor in this patient's sleep fragmentation and REM sleep interruption. REM AHI was 62/h and overall AHI was 16.9/h, supine sleep also contributed to higher AHI.  2. Snoring was noted in REM sleep  3. Non-specific EKG changes.   RECOMMENDATIONS:  1. Avoid supine sleep position. 2. Advise medication treatment for insomnia to allow better adjustment to CPAP therapy. I would offer to supply 28 days of Trazodone medication, 25 mg po to begin with, and set bedtime at 10 PM.  3. Auto CPAP 5-15 cm water, 2 cm EPR and a mask of the patient's choice that allows non supine sleep.      I certify that I have reviewed the entire raw data recording prior to the issuance of this report in accordance with the Standards of Accreditation of the American Academy of Sleep Medicine (AASM)   Larey Seat, MD Diplomat, American Board of Psychiatry and Neurology  Diplomat, American Board of Sleep Medicine Market researcher, [place] [facility] [employer]

## 2020-07-20 NOTE — Progress Notes (Signed)
Snoring was noted during REM sleep, as were brief periods of desaturation in oxygen. EKG with ectopic beats, otherwise normal sinus rhythm (NSR).   IMPRESSION:  1. Mild Obstructive Sleep Apnea (OSA), yet a major factor in this patient's sleep fragmentation and REM sleep interruption. REM AHI was 62/h and overall AHI was 16.9/h, supine sleep also contributed to higher AHI.  2. Snoring was noted in REM sleep  3. Non-specific EKG changes.   RECOMMENDATIONS:  1. Avoid supine sleep position. 2. Advise medication treatment for insomnia to allow better adjustment to CPAP therapy. I would offer to supply 28 days of Trazodone medication, 25 mg po to begin with, and set bedtime at 10 PM.  3. Auto CPAP 5-15 cm water, 2 cm EPR and a mask of the patient's choice that allows non supine sleep.

## 2020-07-23 ENCOUNTER — Telehealth: Payer: Self-pay | Admitting: Neurology

## 2020-07-23 DIAGNOSIS — F411 Generalized anxiety disorder: Secondary | ICD-10-CM | POA: Diagnosis not present

## 2020-07-23 DIAGNOSIS — M069 Rheumatoid arthritis, unspecified: Secondary | ICD-10-CM | POA: Diagnosis not present

## 2020-07-23 DIAGNOSIS — Z6822 Body mass index (BMI) 22.0-22.9, adult: Secondary | ICD-10-CM | POA: Diagnosis not present

## 2020-07-23 NOTE — Telephone Encounter (Signed)
-----   Message from Larey Seat, MD sent at 07/20/2020  3:56 PM EST ----- Snoring was noted during REM sleep, as were brief periods of desaturation in oxygen. EKG with ectopic beats, otherwise normal sinus rhythm (NSR).   IMPRESSION:  1. Mild Obstructive Sleep Apnea (OSA), yet a major factor in this patient's sleep fragmentation and REM sleep interruption. REM AHI was 62/h and overall AHI was 16.9/h, supine sleep also contributed to higher AHI.  2. Snoring was noted in REM sleep  3. Non-specific EKG changes.   RECOMMENDATIONS:  1. Avoid supine sleep position. 2. Advise medication treatment for insomnia to allow better adjustment to CPAP therapy. I would offer to supply 28 days of Trazodone medication, 25 mg po to begin with, and set bedtime at 10 PM.  3. Auto CPAP 5-15 cm water, 2 cm EPR and a mask of the patient's choice that allows non supine sleep.

## 2020-07-23 NOTE — Telephone Encounter (Signed)
I called pt. I advised pt that Dr. Brett Fairy reviewed their sleep study results and found that pt has moderate to severe sleep apnea. Dr. Brett Fairy recommends that pt start auto CPAP. I reviewed PAP compliance expectations with the pt. Pt is agreeable to starting a CPAP. I advised pt that an order will be sent to a DME, [place], and [place] will call the pt within about one week after they file with the pt's insurance. [facility] will show the pt how to use the machine, fit for masks, and troubleshoot the CPAP if needed. A follow up appt will need to be made for insurance purposes with Dr. Brett Fairy ot the NP. Pt verbalized understanding to call and schedule the follow up apt. A letter with all of this information in it will be mailed to the pt as a reminder. I verified with the pt that the address we have on file is correct. Pt verbalized understanding of results. Pt had no questions at this time but was encouraged to call back if questions arise. I have sent the order to [facility] and have received confirmation that they have received the order.

## 2020-08-03 DIAGNOSIS — E7849 Other hyperlipidemia: Secondary | ICD-10-CM | POA: Diagnosis not present

## 2020-08-03 DIAGNOSIS — I10 Essential (primary) hypertension: Secondary | ICD-10-CM | POA: Diagnosis not present

## 2020-08-03 DIAGNOSIS — M0609 Rheumatoid arthritis without rheumatoid factor, multiple sites: Secondary | ICD-10-CM | POA: Diagnosis not present

## 2020-08-03 DIAGNOSIS — F3342 Major depressive disorder, recurrent, in full remission: Secondary | ICD-10-CM | POA: Diagnosis not present

## 2020-09-06 ENCOUNTER — Ambulatory Visit: Payer: Medicare Other | Admitting: Neurology

## 2020-09-14 DIAGNOSIS — R5382 Chronic fatigue, unspecified: Secondary | ICD-10-CM | POA: Diagnosis not present

## 2020-09-14 DIAGNOSIS — E559 Vitamin D deficiency, unspecified: Secondary | ICD-10-CM | POA: Diagnosis not present

## 2020-09-14 DIAGNOSIS — E7849 Other hyperlipidemia: Secondary | ICD-10-CM | POA: Diagnosis not present

## 2020-09-14 DIAGNOSIS — I10 Essential (primary) hypertension: Secondary | ICD-10-CM | POA: Diagnosis not present

## 2020-09-14 DIAGNOSIS — E782 Mixed hyperlipidemia: Secondary | ICD-10-CM | POA: Diagnosis not present

## 2020-09-18 DIAGNOSIS — K2100 Gastro-esophageal reflux disease with esophagitis, without bleeding: Secondary | ICD-10-CM | POA: Diagnosis not present

## 2020-09-18 DIAGNOSIS — I10 Essential (primary) hypertension: Secondary | ICD-10-CM | POA: Diagnosis not present

## 2020-09-18 DIAGNOSIS — E7849 Other hyperlipidemia: Secondary | ICD-10-CM | POA: Diagnosis not present

## 2020-09-18 DIAGNOSIS — E039 Hypothyroidism, unspecified: Secondary | ICD-10-CM | POA: Diagnosis not present

## 2020-09-18 DIAGNOSIS — E559 Vitamin D deficiency, unspecified: Secondary | ICD-10-CM | POA: Diagnosis not present

## 2020-09-18 DIAGNOSIS — M069 Rheumatoid arthritis, unspecified: Secondary | ICD-10-CM | POA: Diagnosis not present

## 2020-09-18 DIAGNOSIS — G43909 Migraine, unspecified, not intractable, without status migrainosus: Secondary | ICD-10-CM | POA: Diagnosis not present

## 2020-10-23 DIAGNOSIS — R5383 Other fatigue: Secondary | ICD-10-CM | POA: Diagnosis not present

## 2020-10-23 DIAGNOSIS — M2550 Pain in unspecified joint: Secondary | ICD-10-CM | POA: Diagnosis not present

## 2020-10-23 DIAGNOSIS — M0579 Rheumatoid arthritis with rheumatoid factor of multiple sites without organ or systems involvement: Secondary | ICD-10-CM | POA: Diagnosis not present

## 2020-10-23 DIAGNOSIS — Z6822 Body mass index (BMI) 22.0-22.9, adult: Secondary | ICD-10-CM | POA: Diagnosis not present

## 2020-10-23 DIAGNOSIS — M159 Polyosteoarthritis, unspecified: Secondary | ICD-10-CM | POA: Diagnosis not present

## 2020-10-31 DIAGNOSIS — I10 Essential (primary) hypertension: Secondary | ICD-10-CM | POA: Diagnosis not present

## 2020-10-31 DIAGNOSIS — F3342 Major depressive disorder, recurrent, in full remission: Secondary | ICD-10-CM | POA: Diagnosis not present

## 2020-10-31 DIAGNOSIS — M0609 Rheumatoid arthritis without rheumatoid factor, multiple sites: Secondary | ICD-10-CM | POA: Diagnosis not present

## 2020-10-31 DIAGNOSIS — E7849 Other hyperlipidemia: Secondary | ICD-10-CM | POA: Diagnosis not present

## 2020-12-01 DIAGNOSIS — E7849 Other hyperlipidemia: Secondary | ICD-10-CM | POA: Diagnosis not present

## 2020-12-01 DIAGNOSIS — F3342 Major depressive disorder, recurrent, in full remission: Secondary | ICD-10-CM | POA: Diagnosis not present

## 2020-12-01 DIAGNOSIS — M0609 Rheumatoid arthritis without rheumatoid factor, multiple sites: Secondary | ICD-10-CM | POA: Diagnosis not present

## 2020-12-01 DIAGNOSIS — I10 Essential (primary) hypertension: Secondary | ICD-10-CM | POA: Diagnosis not present

## 2020-12-31 DIAGNOSIS — E7849 Other hyperlipidemia: Secondary | ICD-10-CM | POA: Diagnosis not present

## 2020-12-31 DIAGNOSIS — M0609 Rheumatoid arthritis without rheumatoid factor, multiple sites: Secondary | ICD-10-CM | POA: Diagnosis not present

## 2020-12-31 DIAGNOSIS — I10 Essential (primary) hypertension: Secondary | ICD-10-CM | POA: Diagnosis not present

## 2020-12-31 DIAGNOSIS — F3342 Major depressive disorder, recurrent, in full remission: Secondary | ICD-10-CM | POA: Diagnosis not present

## 2021-01-01 ENCOUNTER — Other Ambulatory Visit (INDEPENDENT_AMBULATORY_CARE_PROVIDER_SITE_OTHER): Payer: Self-pay | Admitting: Internal Medicine

## 2021-01-09 DIAGNOSIS — H353131 Nonexudative age-related macular degeneration, bilateral, early dry stage: Secondary | ICD-10-CM | POA: Diagnosis not present

## 2021-01-09 DIAGNOSIS — H43813 Vitreous degeneration, bilateral: Secondary | ICD-10-CM | POA: Diagnosis not present

## 2021-01-09 DIAGNOSIS — Z961 Presence of intraocular lens: Secondary | ICD-10-CM | POA: Diagnosis not present

## 2021-01-31 DIAGNOSIS — E7849 Other hyperlipidemia: Secondary | ICD-10-CM | POA: Diagnosis not present

## 2021-01-31 DIAGNOSIS — M0609 Rheumatoid arthritis without rheumatoid factor, multiple sites: Secondary | ICD-10-CM | POA: Diagnosis not present

## 2021-01-31 DIAGNOSIS — F3342 Major depressive disorder, recurrent, in full remission: Secondary | ICD-10-CM | POA: Diagnosis not present

## 2021-01-31 DIAGNOSIS — I10 Essential (primary) hypertension: Secondary | ICD-10-CM | POA: Diagnosis not present

## 2021-03-03 DIAGNOSIS — M0609 Rheumatoid arthritis without rheumatoid factor, multiple sites: Secondary | ICD-10-CM | POA: Diagnosis not present

## 2021-03-03 DIAGNOSIS — I10 Essential (primary) hypertension: Secondary | ICD-10-CM | POA: Diagnosis not present

## 2021-03-03 DIAGNOSIS — F3342 Major depressive disorder, recurrent, in full remission: Secondary | ICD-10-CM | POA: Diagnosis not present

## 2021-03-03 DIAGNOSIS — E7849 Other hyperlipidemia: Secondary | ICD-10-CM | POA: Diagnosis not present

## 2021-03-14 DIAGNOSIS — E7849 Other hyperlipidemia: Secondary | ICD-10-CM | POA: Diagnosis not present

## 2021-03-14 DIAGNOSIS — K2100 Gastro-esophageal reflux disease with esophagitis, without bleeding: Secondary | ICD-10-CM | POA: Diagnosis not present

## 2021-03-14 DIAGNOSIS — E559 Vitamin D deficiency, unspecified: Secondary | ICD-10-CM | POA: Diagnosis not present

## 2021-03-14 DIAGNOSIS — E039 Hypothyroidism, unspecified: Secondary | ICD-10-CM | POA: Diagnosis not present

## 2021-03-14 DIAGNOSIS — E782 Mixed hyperlipidemia: Secondary | ICD-10-CM | POA: Diagnosis not present

## 2021-03-14 DIAGNOSIS — I10 Essential (primary) hypertension: Secondary | ICD-10-CM | POA: Diagnosis not present

## 2021-03-18 DIAGNOSIS — G43909 Migraine, unspecified, not intractable, without status migrainosus: Secondary | ICD-10-CM | POA: Diagnosis not present

## 2021-03-18 DIAGNOSIS — K2100 Gastro-esophageal reflux disease with esophagitis, without bleeding: Secondary | ICD-10-CM | POA: Diagnosis not present

## 2021-03-18 DIAGNOSIS — E7849 Other hyperlipidemia: Secondary | ICD-10-CM | POA: Diagnosis not present

## 2021-03-18 DIAGNOSIS — E039 Hypothyroidism, unspecified: Secondary | ICD-10-CM | POA: Diagnosis not present

## 2021-03-18 DIAGNOSIS — M5430 Sciatica, unspecified side: Secondary | ICD-10-CM | POA: Diagnosis not present

## 2021-03-18 DIAGNOSIS — I10 Essential (primary) hypertension: Secondary | ICD-10-CM | POA: Diagnosis not present

## 2021-03-18 DIAGNOSIS — E559 Vitamin D deficiency, unspecified: Secondary | ICD-10-CM | POA: Diagnosis not present

## 2021-03-18 DIAGNOSIS — M069 Rheumatoid arthritis, unspecified: Secondary | ICD-10-CM | POA: Diagnosis not present

## 2021-03-21 ENCOUNTER — Other Ambulatory Visit: Payer: Self-pay

## 2021-03-21 ENCOUNTER — Ambulatory Visit (INDEPENDENT_AMBULATORY_CARE_PROVIDER_SITE_OTHER): Payer: Medicare Other | Admitting: Orthopedic Surgery

## 2021-03-21 ENCOUNTER — Encounter: Payer: Self-pay | Admitting: Orthopedic Surgery

## 2021-03-21 ENCOUNTER — Ambulatory Visit: Payer: Medicare Other

## 2021-03-21 VITALS — BP 154/69 | Ht 66.0 in | Wt 135.0 lb

## 2021-03-21 DIAGNOSIS — G8929 Other chronic pain: Secondary | ICD-10-CM

## 2021-03-21 DIAGNOSIS — E079 Disorder of thyroid, unspecified: Secondary | ICD-10-CM | POA: Insufficient documentation

## 2021-03-21 DIAGNOSIS — I10 Essential (primary) hypertension: Secondary | ICD-10-CM | POA: Insufficient documentation

## 2021-03-21 DIAGNOSIS — M5416 Radiculopathy, lumbar region: Secondary | ICD-10-CM

## 2021-03-21 DIAGNOSIS — M1990 Unspecified osteoarthritis, unspecified site: Secondary | ICD-10-CM | POA: Insufficient documentation

## 2021-03-21 NOTE — Progress Notes (Signed)
NEW PROBLEM//OFFICE VISIT  Summary assessment and plan:   Encounter Diagnosis  Name Primary?   Chronic radicular pain of lower back Yes    Recommend: PT, salon Pas, xs tylenol , heat pad   F/u prn   Chief Complaint  Patient presents with   Back Pain   85 yo female right lower back pain for several years but worsening over the last 2 years with pain radiating to right knee  H/o PVD and peripheral neuropathy   Pain increases with driving and she is stiff in the am   She uses tylenol XS and Salon Pas and may take 1 tramadol or 2 per day       MEDICAL DECISION MAKING  A.  Encounter Diagnosis  Name Primary?   Chronic radicular pain of lower back Yes    B. DATA ANALYSED:   IMAGING: Interpretation of images: X-rays done in the office show degenerative scoliosis and then a spondylolisthesis grade 1 of L4 on L5  Orders: no  Outside records reviewed: no   C. MANAGEMENT   As above   No orders of the defined types were placed in this encounter.    BP (!) 154/69   Ht '5\' 6"'$  (1.676 m)   Wt 135 lb (61.2 kg)   BMI 21.79 kg/m    General appearance: Well-developed well-nourished no gross deformities  Cardiovascular normal  perfusion normal color without edema  Neurologically grossly no sensation loss or deficits or pathologic reflexes  Psychological: Awake alert and oriented x3 mood and affect normal  Skin no lacerations or ulcerations no nodularity no palpable masses, no erythema or nodularity  Musculoskeletal:  Back: tender right lower back , left side normal midline normal   Left hip normal rom   Normal SLR    ROS No h/o cancer or signif weight loss     Past Medical History:  Diagnosis Date   Abdominal pain    Allergic rhinitis    Anxiety 10/03/2013   Arthralgia 03/31/2014   Bronchitis 09/09/2013   Cellulitis    Diverticulitis    Diverticulosis    Fatigue    GERD (gastroesophageal reflux disease)    Gout    Headache    Headache     Hyperchloremia    Hypercholesterolemia    Hypertension    Hypothyroidism    Lumbar strain    Major depressive disorder    Migraine    Osteoporosis    Rheumatoid arthritis ([place])     Past Surgical History:  Procedure Laterality Date   CARPAL TUNNEL RELEASE     25 years ago   CHOLECYSTECTOMY     20 yrs ago   COLONOSCOPY     COLONOSCOPY N/A 10/25/2014   Procedure: COLONOSCOPY;  Surgeon: Rogene Houston, MD;  [facility];  Service: Endoscopy;  Laterality: N/A;  [place] N/A 06/17/2019   Procedure: ESOPHAGEAL DILATION;  Surgeon: Rogene Houston, MD;  [facility];  Service: Endoscopy;  Laterality: N/A;   ESOPHAGOGASTRODUODENOSCOPY N/A 10/25/2014   Procedure: ESOPHAGOGASTRODUODENOSCOPY (EGD);  Surgeon: Rogene Houston, MD;  [facility];  Service: Endoscopy;  Laterality: N/A;   ESOPHAGOGASTRODUODENOSCOPY (EGD) WITH PROPOFOL N/A 06/17/2019   Procedure: ESOPHAGOGASTRODUODENOSCOPY (EGD) WITH PROPOFOL;  Surgeon: Rogene Houston, MD;  [facility];  Service: Endoscopy;  Laterality: N/A;  130pm   HEMORROIDECTOMY     2005   POLYPECTOMY     TOTAL ABDOMINAL HYSTERECTOMY  Age 82   UPPER GASTROINTESTINAL ENDOSCOPY      Family History  Problem Relation Age of Onset   Alzheimer's disease Father    Alzheimer's disease Sister    Macular degeneration Sister    Healthy Brother    Arthritis Sister    Arthritis Sister    COPD Sister    Heart disease Mother    Seizures Sister    Seizures Other        nephew   Liver cancer Other    Alcohol abuse Sister    Social History   Tobacco Use   Smoking status: Former    Packs/day: 0.50    Types: Cigarettes    Quit date: 10/2016    Years since quitting: 4.4   Smokeless tobacco: Never   Tobacco comments:    Quit 16 days ago after smoking for off and on for years  Vaping Use   Vaping Use: Never used  Substance Use Topics   Alcohol use: Not Currently    Alcohol/week: 0.0  standard drinks    Comment: 4x per week, 1/2 glass of wine   Drug use: No    Allergies  Allergen Reactions   Dexmedetomidine    Diazepam    Ibuprofen Other (See Comments)    Heartburn "intolerance"   Iodine    Penicillin G     Current Meds  Medication Sig   B Complex-C (SUPER B COMPLEX PO) Take by mouth.   Cholecalciferol (VITAMIN D3 PO) Take 2,000 Units by mouth.   famotidine (PEPCID) 40 MG tablet TAKE 1 TABLET BY MOUTH AT BEDTIME   levothyroxine (SYNTHROID, LEVOTHROID) 100 MCG tablet Take 100 mcg by mouth daily before breakfast.   LUTEIN PO Take by mouth in the morning and at bedtime.   Multiple Vitamins-Minerals (PRESERVISION/LUTEIN PO) Take 1 tablet by mouth daily.    Probiotic Product (PROBIOTIC DAILY PO) Take 1 tablet by mouth daily. Sometimes she will take twice a day.    pyridOXINE (VITAMIN B-6) 100 MG tablet Take 100 mg by mouth daily.   traMADol (ULTRAM) 50 MG tablet Take 1 tablet (50 mg total) by mouth every 6 (six) hours as needed. (Patient taking differently: Take 50 mg by mouth every 6 (six) hours as needed for moderate pain.)   TURMERIC PO Take 1,000 mg by mouth daily.   verapamil (CALAN-SR) 240 MG CR tablet Take 240 mg by mouth daily.   [DISCONTINUED] meloxicam (MOBIC) 15 MG tablet Take 15 mg by mouth daily as needed for pain.    [DISCONTINUED] traZODone (DESYREL) 50 MG tablet Take 0.5-1 tablets (25-50 mg total) by mouth at bedtime as needed for sleep.        Arther Abbott, MD  03/21/2021 10:01 AM

## 2021-03-21 NOTE — Addendum Note (Signed)
Addended byCandice Camp on: 03/21/2021 10:33 AM   Modules accepted: Orders

## 2021-03-21 NOTE — Patient Instructions (Addendum)
Use the Salon Pas as needed   Take XS tylenol   Use heating pad as needed   Physical therapy has been ordered for you at [facility]. They should call you to schedule, [phone] is the phone number to call to schedule if you don't hear anything in a few days.

## 2021-03-26 ENCOUNTER — Ambulatory Visit (HOSPITAL_COMMUNITY): Payer: Medicare Other | Attending: Orthopedic Surgery | Admitting: Physical Therapy

## 2021-03-26 ENCOUNTER — Encounter (HOSPITAL_COMMUNITY): Payer: Self-pay | Admitting: Physical Therapy

## 2021-03-26 ENCOUNTER — Other Ambulatory Visit: Payer: Self-pay

## 2021-03-26 DIAGNOSIS — M5441 Lumbago with sciatica, right side: Secondary | ICD-10-CM | POA: Insufficient documentation

## 2021-03-26 DIAGNOSIS — G8929 Other chronic pain: Secondary | ICD-10-CM | POA: Diagnosis not present

## 2021-03-26 DIAGNOSIS — M6281 Muscle weakness (generalized): Secondary | ICD-10-CM

## 2021-03-26 NOTE — Therapy (Signed)
[place], [place], [address] Phone: [phone]   Fax:  [phone]  Physical Therapy Evaluation  Patient Details  Name: Megan Daniel MRN: [id number] Date of Birth: 01/04/1936 Referring Provider (PT): Arther Abbott, MD   Encounter Date: 03/26/2021   PT End of Session - 03/26/21 1450     Visit Number 1    Number of Visits 12    Date for PT Re-Evaluation 05/07/21    Authorization Type medicare part A and BCBS secondary no auth    Progress Note Due on Visit 10    PT Start Time 1450    PT Stop Time 1529    PT Time Calculation (min) 39 min    Activity Tolerance Patient limited by fatigue    Behavior During Therapy [facility] for tasks assessed/performed             Past Medical History:  Diagnosis Date   Abdominal pain    Allergic rhinitis    Anxiety 10/03/2013   Arthralgia 03/31/2014   Bronchitis 09/09/2013   Cellulitis    Diverticulitis    Diverticulosis    Fatigue    GERD (gastroesophageal reflux disease)    Gout    Headache    Headache    Hyperchloremia    Hypercholesterolemia    Hypertension    Hypothyroidism    Lumbar strain    Major depressive disorder    Migraine    Osteoporosis    Rheumatoid arthritis ([place])     Past Surgical History:  Procedure Laterality Date   CARPAL TUNNEL RELEASE     25 years ago   CHOLECYSTECTOMY     20 yrs ago   COLONOSCOPY     COLONOSCOPY N/A 10/25/2014   Procedure: COLONOSCOPY;  Surgeon: Rogene Houston, MD;  [facility];  Service: Endoscopy;  Laterality: N/A;  [place] N/A 06/17/2019   Procedure: ESOPHAGEAL DILATION;  Surgeon: Rogene Houston, MD;  [facility];  Service: Endoscopy;  Laterality: N/A;   ESOPHAGOGASTRODUODENOSCOPY N/A 10/25/2014   Procedure: ESOPHAGOGASTRODUODENOSCOPY (EGD);  Surgeon: Rogene Houston, MD;  [facility];  Service: Endoscopy;  Laterality: N/A;    ESOPHAGOGASTRODUODENOSCOPY (EGD) WITH PROPOFOL N/A 06/17/2019   Procedure: ESOPHAGOGASTRODUODENOSCOPY (EGD) WITH PROPOFOL;  Surgeon: Rogene Houston, MD;  [facility];  Service: Endoscopy;  Laterality: N/A;  130pm   HEMORROIDECTOMY     2005   POLYPECTOMY     TOTAL ABDOMINAL HYSTERECTOMY     Age 30   UPPER GASTROINTESTINAL ENDOSCOPY      There were no vitals filed for this visit.    Subjective Assessment - 03/26/21 1458     Subjective States that she has had back pain for a long time and this is not new. States that it has gotten worse lately. States that she was taking tramadol as needed because the pain was so bad. States that she takes extra strength Tylenol and that works just as well. States that since she saw the MD she learned that she has scoliosis. Reports she had some recent imaging. States that she wants to learn some ways to deal with her pain. States that she uses heat and cool. States she hasn't had any Tylenol today. States that her pain is worse at the end of the day and with fatigue. It depends on what she is doing and if she does a lot her back is talking to  her.    Pertinent History anxiety, HTN, depression, osteoporosis , RA    How long can you stand comfortably? several hours - 3-4 hours    Diagnostic tests xray - scoliosis    Patient Stated Goals to be able to learn exercises to help manage her symptoms.    Currently in Pain? Yes    Pain Score 8    last night   Pain Location Back    Pain Orientation Right    Pain Descriptors / Indicators Aching    Pain Type Chronic pain    Aggravating Factors  fatigue, standing, working    Pain Relieving Factors rest, heat, ice, medication                [facility] PT Assessment - 03/26/21 0001       Assessment   Medical Diagnosis LBP radiating to the right knee    Referring Provider (PT) Arther Abbott, MD      Balance Screen   Has the patient fallen in the past 6 months No      [place] residence      Prior Function   Level of Independence Independent      Observation/Other Assessments   Focus on Therapeutic Outcomes (FOTO)  43.3% function      ROM / Strength   AROM / PROM / Strength AROM;Strength      AROM   Overall AROM Comments no lumbar symptoms noted with lx ROM    AROM Assessment Site Lumbar;Hip    Right/Left Hip Left;Right    Right Hip Extension 15    Left Hip Extension 15    Lumbar Flexion 25%limited    Lumbar Extension 75% limited    Lumbar - Right Side Bend 25%limited    Lumbar - Left Side Bend 25%limited      Strength   Strength Assessment Site Knee;Ankle;Hip    Right/Left Hip Left;Right    Right Hip Flexion 4-/5    Right Hip Extension 3/5    Left Hip Flexion 4-/5    Left Hip Extension 3/5    Right/Left Knee Right;Left    Right Knee Flexion 3+/5    Right Knee Extension 4-/5    Left Knee Flexion 3+/5    Left Knee Extension 4-/5    Right/Left Ankle Left;Right    Right Ankle Dorsiflexion 4+/5    Left Ankle Dorsiflexion 4+/5      Special Tests    Special Tests Lumbar    Lumbar Tests Prone Knee Bend Test;Slump Test;Straight Leg Raise      Slump test   Findings Negative      Prone Knee Bend Test   Findings Negative      Straight Leg Raise   Findings Negative                        Objective measurements completed on examination: See above findings.       [facility] Adult PT Treatment/Exercise - 03/26/21 0001       Exercises   Exercises Knee/Hip      Knee/Hip Exercises: Prone   Hip Extension 3 sets;5 reps;Both                    PT Education - 03/26/21 1525     Education Details on findings, on current presenation, on HEP    Person(s) Educated Patient    Methods Explanation    Comprehension Verbalized  understanding              PT Short Term Goals - 03/26/21 1503       PT SHORT TERM GOAL #1   Title Patient will report at least 50% improvement in overall symptoms and/or  function to demonstrate improved functional mobility    Time 3    Period Weeks    Status New    Target Date 04/16/21      PT SHORT TERM GOAL #2   Title Patient will be independent in self management strategies to improve quality of life and functional outcomes.    Time 3    Period Weeks    Status New    Target Date 04/16/21      PT SHORT TERM GOAL #3   Title Patient will be able to demonstrate good sitting posture for at least 60 seconds to demonstrate improved sitting endurance    Time 3    Period Weeks    Status New    Target Date 04/16/21               PT Long Term Goals - 03/26/21 1503       PT LONG TERM GOAL #1   Title Patient will report at least 75% improvement in overall symptoms and/or function to demonstrate improved functional mobility    Time 6    Period Weeks    Status New    Target Date 05/07/21      PT LONG TERM GOAL #2   Title Patient will improve bil LE strength for limited groups by 1 grade to improve muscle endurance with activities and overall gait quality and performance with stair mobility.    Time 6    Period Weeks    Status New    Target Date 05/07/21      PT LONG TERM GOAL #3   Title Patient will mee predicted FOTO score to demonstrate improved overall function.    Time 6    Period Weeks    Status New    Target Date 05/07/21                    Plan - 03/26/21 1506     Clinical Impression Statement Patient is an 85 y.o. female who presents to physical therapy with complaint of low back pain . Patient demonstrates decreased strength, ROM restriction and gait abnormalities which are likely contributing to symptoms of pain and are negatively impacting patient ability to perform ADLs and functional mobility tasks. Patient will benefit from skilled physical therapy services to address these deficits to reduce pain, improve level of function with ADLs, functional mobility tasks, and reduce risk for falls.    Personal Factors and  Comorbidities Comorbidity 1;Comorbidity 2    Comorbidities anxiety, HTN, depression, osteoporosis , RA    Examination-Activity Limitations Bend;Squat;Stand;Reach Overhead;Locomotion Level    Examination-Participation Restrictions Occupation;Community Activity;Meal Prep;Laundry;Yard Work;Cleaning    Stability/Clinical Decision Making Stable/Uncomplicated    Clinical Decision Making Low    Rehab Potential Good    PT Frequency 2x / week    PT Duration 6 weeks    PT Treatment/Interventions ADLs/Self Care Home Management;Aquatic Therapy;Traction;Electrical Stimulation;Cryotherapy;Therapeutic activities;Therapeutic exercise;Manual techniques;Neuromuscular re-education;Patient/family education;Dry needling;Gait training;Stair training;Balance training;Moist Heat;Passive range of motion;Joint Manipulations    PT Next Visit Plan core and LE/hip strengthening- progress to endurance and functional strengthening once tolerated    PT Home Exercise Plan prone hip extension    Consulted and Agree with Plan of Care  Patient             Patient will benefit from skilled therapeutic intervention in order to improve the following deficits and impairments:  Pain, Decreased activity tolerance, Difficulty walking, Decreased mobility, Decreased range of motion, Postural dysfunction, Decreased endurance, Decreased strength, Improper body mechanics  Visit Diagnosis: Chronic midline low back pain with right-sided sciatica  Muscle weakness (generalized)     Problem List Patient Active Problem List   Diagnosis Date Noted   Hypertension 03/21/2021   Thyroid disease 03/21/2021   Arthritis 03/21/2021   Dysphagia 05/03/2019   Idiopathic small fiber peripheral neuropathy 12/31/2017   CTS (carpal tunnel syndrome) 11/19/2017   Idiopathic polyneuropathy 11/19/2017   Varicose veins of left lower extremity with complications XX123456   Headache(784.0) 04/08/2014   Joint pain 04/08/2014   Other malaise and  fatigue 04/08/2014   Neuropathy 04/08/2014   Achalasia 04/03/2014   Personal history of colonic polyps 04/03/2014   GERD (gastroesophageal reflux disease) 04/03/2014  4:29 PM, 03/26/21 Jerene Pitch, DPT Physical Therapy with [facility]  [phone] office   [place], [place], [address] Phone: [phone]   Fax:  [phone]  Name: Megan Daniel MRN: [id number] Date of Birth: 07-Aug-1935

## 2021-03-28 ENCOUNTER — Other Ambulatory Visit: Payer: Self-pay

## 2021-03-28 ENCOUNTER — Ambulatory Visit (HOSPITAL_COMMUNITY): Payer: Medicare Other

## 2021-03-28 DIAGNOSIS — M6281 Muscle weakness (generalized): Secondary | ICD-10-CM | POA: Diagnosis not present

## 2021-03-28 DIAGNOSIS — G8929 Other chronic pain: Secondary | ICD-10-CM | POA: Diagnosis not present

## 2021-03-28 DIAGNOSIS — M5441 Lumbago with sciatica, right side: Secondary | ICD-10-CM | POA: Diagnosis not present

## 2021-03-28 NOTE — Therapy (Addendum)
[place], [place], [address] Phone: [phone]   Fax:  [phone]  Physical Therapy Treatment  Patient Details  Name: Megan Daniel MRN: [id number] Date of Birth: [birth year] Referring Provider (PT): Arther Abbott, MD   Encounter Date: 03/28/2021   PT End of Session - 03/28/21 1229     Visit Number 2    Number of Visits 12    Date for PT Re-Evaluation 05/07/21    Authorization Type medicare part A and BCBS secondary no auth    Progress Note Due on Visit 10    PT Start Time 1200    PT Stop Time 1240    PT Time Calculation (min) 40 min    Activity Tolerance Patient limited by fatigue    Behavior During Therapy [facility] for tasks assessed/performed             Past Medical History:  Diagnosis Date   Abdominal pain    Allergic rhinitis    Anxiety 10/03/2013   Arthralgia 03/31/2014   Bronchitis 09/09/2013   Cellulitis    Diverticulitis    Diverticulosis    Fatigue    GERD (gastroesophageal reflux disease)    Gout    Headache    Headache    Hyperchloremia    Hypercholesterolemia    Hypertension    Hypothyroidism    Lumbar strain    Major depressive disorder    Migraine    Osteoporosis    Rheumatoid arthritis ([place])     Past Surgical History:  Procedure Laterality Date   CARPAL TUNNEL RELEASE     25 years ago   CHOLECYSTECTOMY     20 yrs ago   COLONOSCOPY     COLONOSCOPY N/A 10/25/2014   Procedure: COLONOSCOPY;  Surgeon: Rogene Houston, MD;  [facility];  Service: Endoscopy;  Laterality: N/A;  [place] N/A 06/17/2019   Procedure: ESOPHAGEAL DILATION;  Surgeon: Rogene Houston, MD;  [facility];  Service: Endoscopy;  Laterality: N/A;   ESOPHAGOGASTRODUODENOSCOPY N/A 10/25/2014   Procedure: ESOPHAGOGASTRODUODENOSCOPY (EGD);  Surgeon: Rogene Houston, MD;  [facility];  Service: Endoscopy;  Laterality: N/A;    ESOPHAGOGASTRODUODENOSCOPY (EGD) WITH PROPOFOL N/A 06/17/2019   Procedure: ESOPHAGOGASTRODUODENOSCOPY (EGD) WITH PROPOFOL;  Surgeon: Rogene Houston, MD;  [facility];  Service: Endoscopy;  Laterality: N/A;  130pm   HEMORROIDECTOMY     2005   POLYPECTOMY     TOTAL ABDOMINAL HYSTERECTOMY     Age 86   UPPER GASTROINTESTINAL ENDOSCOPY      There were no vitals filed for this visit.   Subjective Assessment - 03/28/21 1215     Subjective States no pain today. Did a lot of work in her yard and around the house yesterday and was in pain last night, 8/10. Did not take any pain medicine but did use a pillow under her knees and her heating pad and that helped a lot. Needs help on how to lifting without hurting her back.    Pertinent History anxiety, HTN, depression, osteoporosis , RA    How long can you stand comfortably? several hours - 3-4 hours    Diagnostic tests xray - scoliosis    Patient Stated Goals to be able to learn exercises to help manage her symptoms.    Currently in Pain? No/denies               [facility] Adult  PT Treatment/Exercise - 03/28/21 0001       Knee/Hip Exercises: Seated   Sit to Sand 10 reps;without UE support      Knee/Hip Exercises: Supine   Bridges Strengthening;Both;1 set;10 reps;Limitations   ab set   [employer] Limitations 3 se chold    Other Supine Knee/Hip Exercises ab set      Knee/Hip Exercises: Sidelying   Clams x10 + ab set      Knee/Hip Exercises: Prone   Hip Extension 3 sets;5 reps;Both    Hip Extension Limitations hip extension with knee bent 5 reps 1 set                    PT Education - 03/28/21 1228     Education Details Reviewed initial evaluation, goals and objectives. Discussed purpose and technique of interventions throughout session. Reviewed and advanced HEP.    Person(s) Educated Patient    Methods Explanation;Handout    Comprehension Verbalized understanding              PT Short Term Goals -  03/26/21 1503       PT SHORT TERM GOAL #1   Title Patient will report at least 50% improvement in overall symptoms and/or function to demonstrate improved functional mobility    Time 3    Period Weeks    Status New    Target Date 04/16/21      PT SHORT TERM GOAL #2   Title Patient will be independent in self management strategies to improve quality of life and functional outcomes.    Time 3    Period Weeks    Status New    Target Date 04/16/21      PT SHORT TERM GOAL #3   Title Patient will be able to demonstrate good sitting posture for at least 60 seconds to demonstrate improved sitting endurance    Time 3    Period Weeks    Status New    Target Date 04/16/21               PT Long Term Goals - 03/26/21 1503       PT LONG TERM GOAL #1   Title Patient will report at least 75% improvement in overall symptoms and/or function to demonstrate improved functional mobility    Time 6    Period Weeks    Status New    Target Date 05/07/21      PT LONG TERM GOAL #2   Title Patient will improve bil LE strength for limited groups by 1 grade to improve muscle endurance with activities and overall gait quality and performance with stair mobility.    Time 6    Period Weeks    Status New    Target Date 05/07/21      PT LONG TERM GOAL #3   Title Patient will mee predicted FOTO score to demonstrate improved overall function.    Time 6    Period Weeks    Status New    Target Date 05/07/21                   Plan - 03/28/21 1230     Clinical Impression Statement Reviewed initial evaluation, goals and objectives. Discussed purpose and technique of interventions throughout session. Added therapeutic exercises and HEP to include sit to stand, supine ab set and bridge and sidelying clams. Patient reported no pain at end of session but did note faigue. Patient will continue to benefit  from skilled physical therapy to reduce impairment and improved function.    Personal  Factors and Comorbidities Comorbidity 1;Comorbidity 2    Comorbidities anxiety, HTN, depression, osteoporosis , RA    Examination-Activity Limitations Bend;Squat;Stand;Reach Overhead;Locomotion Level    Examination-Participation Restrictions Occupation;Community Activity;Meal Prep;Laundry;Yard Work;Cleaning    Stability/Clinical Decision Making Stable/Uncomplicated    Rehab Potential Good    PT Frequency 2x / week    PT Duration 6 weeks    PT Treatment/Interventions ADLs/Self Care Home Management;Aquatic Therapy;Traction;Electrical Stimulation;Cryotherapy;Therapeutic activities;Therapeutic exercise;Manual techniques;Neuromuscular re-education;Patient/family education;Dry needling;Gait training;Stair training;Balance training;Moist Heat;Passive range of motion;Joint Manipulations    PT Next Visit Plan core and LE/hip strengthening- progress to endurance and functional strengthening once tolerated; lifting techniques    PT Home Exercise Plan prone hip extension; 8/25 - STS, bridge, sidelying clams    Consulted and Agree with Plan of Care Patient             Patient will benefit from skilled therapeutic intervention in order to improve the following deficits and impairments:  Pain, Decreased activity tolerance, Difficulty walking, Decreased mobility, Decreased range of motion, Postural dysfunction, Decreased endurance, Decreased strength, Improper body mechanics  Visit Diagnosis: Chronic midline low back pain with right-sided sciatica  Muscle weakness (generalized)     Problem List Patient Active Problem List   Diagnosis Date Noted   Hypertension 03/21/2021   Thyroid disease 03/21/2021   Arthritis 03/21/2021   Dysphagia 05/03/2019   Idiopathic small fiber peripheral neuropathy 12/31/2017   CTS (carpal tunnel syndrome) 11/19/2017   Idiopathic polyneuropathy 11/19/2017   Varicose veins of left lower extremity with complications [id number]   Headache(784.0) 04/08/2014   Joint pain  04/08/2014   Other malaise and fatigue 04/08/2014   Neuropathy 04/08/2014   Achalasia 04/03/2014   Personal history of colonic polyps 04/03/2014   GERD (gastroesophageal reflux disease) 04/03/2014   Floria Raveling. Hartnett-Rands, MS, PT Per [place] [phone]  Jeannie Done 03/28/2021, 12:46 PM  [place] [address] [place], [place], [address] Phone: [phone]   Fax:  [phone]  Name: Megan Daniel MRN: [id number] Date of Birth: [birth year]

## 2021-03-28 NOTE — Patient Instructions (Signed)
Isometric Abdominal    Lying on back with knees bent, tighten stomach by pressing elbows down. Hold _5_ seconds. Repeat 10-15_ times per set. Do _1_ sets per session. Do _1_ sessions per day.  http://orth.exer.us/1087   Copyright  VHI. All rights reserved.   Flexors, Supine Bridge    Lie supine, feet shoulder-width apart. Lift hips toward ceiling. Hold 3-5_ seconds. Repeat 10-15 times per session. Do _1 sessions per day.  Copyright  VHI. All rights reserved.   HIP: Abduction / External Rotation - Side-Lying    Lie on side with hip and knees bent. Raise top knee up, squeezing glutes. Keep ankles together. 10-15_ reps per set, 1_ sets per day.   Copyright  VHI. All rights reserved.    Sit-to-Stand Exercise  The sit-to-stand exercise (also known as the chair stand or chair rise exercise) strengthens your lower body and helps you maintain or improve your mobility and independence. The goal is to do the sit-to-stand exercise without using your hands. This will be easier as you become stronger. You should always talk with your health care provider before starting any exercise program, especiallyif you have had recent surgery. Do the exercise exactly as told by your health care provider and adjust it as directed. It is normal to feel mild stretching, pulling, tightness, or discomfort as you do this exercise, but you should stop right away if you feel sudden pain or your pain gets worse. Do not begin doing this exercise until told by your health care provider. What the sit-to-stand exercise does The sit-to-stand exercise helps to strengthen the muscles in your thighs and the muscles in the center of your body that give you stability (core muscles). This exercise is especially helpful if: You have had knee or hip surgery. You have trouble getting up from a chair, out of a car, or off the toilet. How to do the sit-to-stand exercise Sit toward the front edge of a sturdy chair without  armrests. Your knees should be bent and your feet should be flat on the floor and shoulder-width apart. Place your hands lightly on each side of the seat. Keep your back and neck as straight as possible, with your chest slightly forward. Breathe in slowly. Lean forward and slightly shift your weight to the front of your feet. Breathe out as you slowly stand up. Use your hands as little as possible. Stand and pause for a full breath in and out. Breathe in as you sit down slowly. Tighten your core and abdominal muscles to control your lowering as much as possible. Breathe out slowly. Do this exercise 10-15 times. If needed, do it fewer times until you build up strength. Rest for 1 minute, then do another set of 10-15 repetitions. To change the difficulty of the sit-to-stand exercise If the exercise is too difficult, use a chair with sturdy armrests, and push off the armrests to help you come to the standing position. You can also use the armrests to help slowly lower yourself back to sitting. As this gets easier, try to use your arms less. You can also place a firm cushion or pillow on the chair to make the surface higher. If this exercise is too easy, do not use your arms to help raise or lower yourself. You can also wear a weighted vest, use hand weights, increase your repetitions, or try a lower chair. General tips You may feel tired when starting an exercise routine. This is normal. You may have muscle soreness that  lasts a few days. This is normal. As you get stronger, you may not feel muscle soreness. Use smooth, steady movements. Do not  hold your breath during strength exercises. This can cause unsafe changes in your blood pressure. Breathe in slowly through your nose, and breathe out slowly through your mouth. Summary Strengthening your lower body is an important step to help you move safely and independently. The sit-to-stand exercise helps strengthen the muscles in your thighs and  core. You should always talk with your health care provider before starting any exercise program, especially if you have had recent surgery. This information is not intended to replace advice given to you by your health care provider. Make sure you discuss any questions you have with your healthcare provider. Document Revised: 07/06/2020 Document Reviewed: 07/06/2020 Elsevier Patient Education  2022 [employer].

## 2021-04-02 ENCOUNTER — Ambulatory Visit (HOSPITAL_COMMUNITY): Payer: Medicare Other | Admitting: Physical Therapy

## 2021-04-02 ENCOUNTER — Other Ambulatory Visit: Payer: Self-pay

## 2021-04-02 ENCOUNTER — Encounter (HOSPITAL_COMMUNITY): Payer: Self-pay | Admitting: Physical Therapy

## 2021-04-02 DIAGNOSIS — G8929 Other chronic pain: Secondary | ICD-10-CM | POA: Diagnosis not present

## 2021-04-02 DIAGNOSIS — M5441 Lumbago with sciatica, right side: Secondary | ICD-10-CM | POA: Diagnosis not present

## 2021-04-02 DIAGNOSIS — M6281 Muscle weakness (generalized): Secondary | ICD-10-CM

## 2021-04-02 NOTE — Patient Instructions (Signed)
Access Code: [email or web address] URL: [email or web address] Date: 04/02/2021 Prepared by: Mitzi Hansen Aquila Menzie  Exercises Hooklying Single Knee to Chest Stretch - 1 x daily - 7 x weekly - 3 reps - 20 second hold Supine Double Knee to Chest - 1 x daily - 7 x weekly - 3 reps - 20 second hold Standing Hip Abduction with Counter Support - 1 x daily - 7 x weekly - 2 sets - 10 reps

## 2021-04-02 NOTE — Therapy (Signed)
[place], [place], [address] Phone: [phone]   Fax:  [phone]  Physical Therapy Treatment  Patient Details  Name: Megan Daniel MRN: [id number] Date of Birth: 1935-09-29 Referring Provider (PT): Arther Abbott, MD   Encounter Date: 04/02/2021   PT End of Session - 04/02/21 1051     Visit Number 3    Number of Visits 12    Date for PT Re-Evaluation 05/07/21    Authorization Type medicare part A and BCBS secondary no auth    Progress Note Due on Visit 10    PT Start Time [id number]    PT Stop Time 1130    PT Time Calculation (min) 39 min    Activity Tolerance Patient limited by fatigue    Behavior During Therapy [facility] for tasks assessed/performed             Past Medical History:  Diagnosis Date   Abdominal pain    Allergic rhinitis    Anxiety 10/03/2013   Arthralgia 03/31/2014   Bronchitis 09/09/2013   Cellulitis    Diverticulitis    Diverticulosis    Fatigue    GERD (gastroesophageal reflux disease)    Gout    Headache    Headache    Hyperchloremia    Hypercholesterolemia    Hypertension    Hypothyroidism    Lumbar strain    Major depressive disorder    Migraine    Osteoporosis    Rheumatoid arthritis ([place])     Past Surgical History:  Procedure Laterality Date   CARPAL TUNNEL RELEASE     25 years ago   CHOLECYSTECTOMY     20 yrs ago   COLONOSCOPY     COLONOSCOPY N/A 10/25/2014   Procedure: COLONOSCOPY;  Surgeon: Rogene Houston, MD;  [facility];  Service: Endoscopy;  Laterality: N/A;  [place] N/A 06/17/2019   Procedure: ESOPHAGEAL DILATION;  Surgeon: Rogene Houston, MD;  [facility];  Service: Endoscopy;  Laterality: N/A;   ESOPHAGOGASTRODUODENOSCOPY N/A 10/25/2014   Procedure: ESOPHAGOGASTRODUODENOSCOPY (EGD);  Surgeon: Rogene Houston, MD;  [facility];  Service: Endoscopy;  Laterality: N/A;    ESOPHAGOGASTRODUODENOSCOPY (EGD) WITH PROPOFOL N/A 06/17/2019   Procedure: ESOPHAGOGASTRODUODENOSCOPY (EGD) WITH PROPOFOL;  Surgeon: Rogene Houston, MD;  [facility];  Service: Endoscopy;  Laterality: N/A;  130pm   HEMORROIDECTOMY     2005   POLYPECTOMY     TOTAL ABDOMINAL HYSTERECTOMY     Age 5   UPPER GASTROINTESTINAL ENDOSCOPY      There were no vitals filed for this visit.   Subjective Assessment - 04/02/21 1052     Subjective Patient states she is improving. She states she was so sore following last session she had to take extra strength Tylenol.    Pertinent History anxiety, HTN, depression, osteoporosis , RA    How long can you stand comfortably? several hours - 3-4 hours    Diagnostic tests xray - scoliosis    Patient Stated Goals to be able to learn exercises to help manage her symptoms.    Currently in Pain? No/denies                               [facility] Adult PT Treatment/Exercise - 04/02/21 0001       Knee/Hip Exercises: Stretches   Other Knee/Hip Stretches SKTC  3x 20 seconds bilateral    Other Knee/Hip Stretches DKTC 1x 30 second holds      Knee/Hip Exercises: Standing   Hip Flexion Both;2 sets;10 reps    Hip Flexion Limitations alternating march    Hip Abduction Both;2 sets;10 reps    Abduction Limitations cueing for glute activation      Knee/Hip Exercises: Supine   Bridges Both;3 sets;10 reps    Bridges Limitations 3 second holds                    PT Education - 04/02/21 1052     Education Details HEP, DOMS    Person(s) Educated Patient    Methods Explanation    Comprehension Verbalized understanding              PT Short Term Goals - 03/26/21 1503       PT SHORT TERM GOAL #1   Title Patient will report at least 50% improvement in overall symptoms and/or function to demonstrate improved functional mobility    Time 3    Period Weeks    Status New    Target Date 04/16/21      PT SHORT TERM GOAL  #2   Title Patient will be independent in self management strategies to improve quality of life and functional outcomes.    Time 3    Period Weeks    Status New    Target Date 04/16/21      PT SHORT TERM GOAL #3   Title Patient will be able to demonstrate good sitting posture for at least 60 seconds to demonstrate improved sitting endurance    Time 3    Period Weeks    Status New    Target Date 04/16/21               PT Long Term Goals - 03/26/21 1503       PT LONG TERM GOAL #1   Title Patient will report at least 75% improvement in overall symptoms and/or function to demonstrate improved functional mobility    Time 6    Period Weeks    Status New    Target Date 05/07/21      PT LONG TERM GOAL #2   Title Patient will improve bil LE strength for limited groups by 1 grade to improve muscle endurance with activities and overall gait quality and performance with stair mobility.    Time 6    Period Weeks    Status New    Target Date 05/07/21      PT LONG TERM GOAL #3   Title Patient will mee predicted FOTO score to demonstrate improved overall function.    Time 6    Period Weeks    Status New    Target Date 05/07/21                   Plan - 04/02/21 1051     Clinical Impression Statement Continued with core/hip strengthening exercises and began session with table exercises. Patient states flexion stretches feel good for back symptoms. Patient tolerating increased reps of previously completed exercises. Patient given intermittent cueing for posture throughout session. Patient will continue to benefit from physical therapy in order to reduce impairment and improve function.    Personal Factors and Comorbidities Comorbidity 1;Comorbidity 2    Comorbidities anxiety, HTN, depression, osteoporosis , RA    Examination-Activity Limitations Bend;Squat;Stand;Reach Overhead;Locomotion Level    Examination-Participation Restrictions Occupation;Community Activity;Meal  Prep;Laundry;Yard Work;Cleaning  Stability/Clinical Decision Making Stable/Uncomplicated    Rehab Potential Good    PT Frequency 2x / week    PT Duration 6 weeks    PT Treatment/Interventions ADLs/Self Care Home Management;Aquatic Therapy;Traction;Electrical Stimulation;Cryotherapy;Therapeutic activities;Therapeutic exercise;Manual techniques;Neuromuscular re-education;Patient/family education;Dry needling;Gait training;Stair training;Balance training;Moist Heat;Passive range of motion;Joint Manipulations    PT Next Visit Plan core and LE/hip strengthening- progress to endurance and functional strengthening once tolerated; lifting techniques    PT Home Exercise Plan prone hip extension; 8/25 - STS, bridge, sidelying clams 8/30 SKTC, DKTC, standing hip abd    Consulted and Agree with Plan of Care Patient             Patient will benefit from skilled therapeutic intervention in order to improve the following deficits and impairments:  Pain, Decreased activity tolerance, Difficulty walking, Decreased mobility, Decreased range of motion, Postural dysfunction, Decreased endurance, Decreased strength, Improper body mechanics  Visit Diagnosis: Chronic midline low back pain with right-sided sciatica  Muscle weakness (generalized)     Problem List Patient Active Problem List   Diagnosis Date Noted   Hypertension 03/21/2021   Thyroid disease 03/21/2021   Arthritis 03/21/2021   Dysphagia 05/03/2019   Idiopathic small fiber peripheral neuropathy 12/31/2017   CTS (carpal tunnel syndrome) 11/19/2017   Idiopathic polyneuropathy 11/19/2017   Varicose veins of left lower extremity with complications [id number]   Headache(784.0) 04/08/2014   Joint pain 04/08/2014   Other malaise and fatigue 04/08/2014   Neuropathy 04/08/2014   Achalasia 04/03/2014   Personal history of colonic polyps 04/03/2014   GERD (gastroesophageal reflux disease) 04/03/2014    11:32 AM, 04/02/21 Mearl Latin PT, DPT Physical Therapist at [facility] [place], [place], [address] Phone: [phone]   Fax:  [phone]  Name: Gaudalupe Coyle MRN: [id number] Date of Birth: Sep 26, 1935

## 2021-04-04 ENCOUNTER — Ambulatory Visit (HOSPITAL_COMMUNITY): Payer: Medicare Other | Attending: Orthopedic Surgery | Admitting: Physical Therapy

## 2021-04-04 ENCOUNTER — Other Ambulatory Visit: Payer: Self-pay

## 2021-04-04 DIAGNOSIS — M6281 Muscle weakness (generalized): Secondary | ICD-10-CM

## 2021-04-04 DIAGNOSIS — M5441 Lumbago with sciatica, right side: Secondary | ICD-10-CM | POA: Diagnosis not present

## 2021-04-04 DIAGNOSIS — G8929 Other chronic pain: Secondary | ICD-10-CM | POA: Diagnosis not present

## 2021-04-04 NOTE — Therapy (Signed)
[place], [place], [address] Phone: [phone]   Fax:  [phone]  Physical Therapy Treatment  Patient Details  Name: Megan Daniel MRN: [id number] Date of Birth: 14-Feb-1936 Referring Provider (PT): Arther Abbott, MD   Encounter Date: 04/04/2021   PT End of Session - 04/04/21 1127     Visit Number 4    Number of Visits 12    Date for PT Re-Evaluation 05/07/21    Authorization Type medicare part A and BCBS secondary no auth    Progress Note Due on Visit 10    PT Start Time 1123    PT Stop Time 1203    PT Time Calculation (min) 40 min    Activity Tolerance Patient limited by fatigue;Patient tolerated treatment well    Behavior During Therapy [facility] for tasks assessed/performed             Past Medical History:  Diagnosis Date   Abdominal pain    Allergic rhinitis    Anxiety 10/03/2013   Arthralgia 03/31/2014   Bronchitis 09/09/2013   Cellulitis    Diverticulitis    Diverticulosis    Fatigue    GERD (gastroesophageal reflux disease)    Gout    Headache    Headache    Hyperchloremia    Hypercholesterolemia    Hypertension    Hypothyroidism    Lumbar strain    Major depressive disorder    Migraine    Osteoporosis    Rheumatoid arthritis ([place])     Past Surgical History:  Procedure Laterality Date   CARPAL TUNNEL RELEASE     25 years ago   CHOLECYSTECTOMY     20 yrs ago   COLONOSCOPY     COLONOSCOPY N/A 10/25/2014   Procedure: COLONOSCOPY;  Surgeon: Rogene Houston, MD;  [facility];  Service: Endoscopy;  Laterality: N/A;  [place] N/A 06/17/2019   Procedure: ESOPHAGEAL DILATION;  Surgeon: Rogene Houston, MD;  [facility];  Service: Endoscopy;  Laterality: N/A;   ESOPHAGOGASTRODUODENOSCOPY N/A 10/25/2014   Procedure: ESOPHAGOGASTRODUODENOSCOPY (EGD);  Surgeon: Rogene Houston, MD;  [facility];  Service: Endoscopy;  Laterality:  N/A;   ESOPHAGOGASTRODUODENOSCOPY (EGD) WITH PROPOFOL N/A 06/17/2019   Procedure: ESOPHAGOGASTRODUODENOSCOPY (EGD) WITH PROPOFOL;  Surgeon: Rogene Houston, MD;  [facility];  Service: Endoscopy;  Laterality: N/A;  130pm   HEMORROIDECTOMY     2005   POLYPECTOMY     TOTAL ABDOMINAL HYSTERECTOMY     Age 62   UPPER GASTROINTESTINAL ENDOSCOPY      There were no vitals filed for this visit.   Subjective Assessment - 04/04/21 1126     Subjective Patient states she can see some improvements. She has had increased pain the past 24 hours but isn't sure why. She states she has been doing some lifting.    Pertinent History anxiety, HTN, depression, osteoporosis , RA    How long can you stand comfortably? several hours - 3-4 hours    Diagnostic tests xray - scoliosis    Patient Stated Goals to be able to learn exercises to help manage her symptoms.    Currently in Pain? No/denies                               [facility] Adult PT Treatment/Exercise - 04/04/21 0001  Exercises   Exercises Lumbar      Lumbar Exercises: Stretches   Lower Trunk Rotation 5 reps;10 seconds      Lumbar Exercises: Supine   Ab Set 10 reps;5 seconds    Bent Knee Raise 20 reps    Bridge 20 reps    Straight Leg Raise 10 reps    Straight Leg Raises Limitations with ab brace    Other Supine Lumbar Exercises supine scapular retraction 10 x 5"      Lumbar Exercises: Sidelying   Clam 20 reps      Knee/Hip Exercises: Stretches   Other Knee/Hip Stretches SKTC 3x 20 seconds bilateral                      PT Short Term Goals - 03/26/21 1503       PT SHORT TERM GOAL #1   Title Patient will report at least 50% improvement in overall symptoms and/or function to demonstrate improved functional mobility    Time 3    Period Weeks    Status New    Target Date 04/16/21      PT SHORT TERM GOAL #2   Title Patient will be independent in self management strategies to improve  quality of life and functional outcomes.    Time 3    Period Weeks    Status New    Target Date 04/16/21      PT SHORT TERM GOAL #3   Title Patient will be able to demonstrate good sitting posture for at least 60 seconds to demonstrate improved sitting endurance    Time 3    Period Weeks    Status New    Target Date 04/16/21               PT Long Term Goals - 03/26/21 1503       PT LONG TERM GOAL #1   Title Patient will report at least 75% improvement in overall symptoms and/or function to demonstrate improved functional mobility    Time 6    Period Weeks    Status New    Target Date 05/07/21      PT LONG TERM GOAL #2   Title Patient will improve bil LE strength for limited groups by 1 grade to improve muscle endurance with activities and overall gait quality and performance with stair mobility.    Time 6    Period Weeks    Status New    Target Date 05/07/21      PT LONG TERM GOAL #3   Title Patient will mee predicted FOTO score to demonstrate improved overall function.    Time 6    Period Weeks    Status New    Target Date 05/07/21                   Plan - 04/04/21 1153     Clinical Impression Statement Patient tolerating session well overall. Patient continues to be limited by RT hip weakness noted with increased discomfort during latter reps with SLR and clamshell on RT. Educated patient on likely contribution of muscle weakness about this area to pain symptoms. Patient cued on hold times and rep counts with exercise. Reviewed core activation and cued patient on proper form with TA bracing exercise. Updated and issued HEP handout. Patient will continue to benefit from skilled therapy services to reduce deficits and improve functional ability.    Personal Factors and Comorbidities Comorbidity 1;Comorbidity 2  Comorbidities anxiety, HTN, depression, osteoporosis , RA    Examination-Activity Limitations Bend;Squat;Stand;Reach Overhead;Locomotion Level     Examination-Participation Restrictions Occupation;Community Activity;Meal Prep;Laundry;Yard Work;Cleaning    Stability/Clinical Decision Making Stable/Uncomplicated    Rehab Potential Good    PT Frequency 2x / week    PT Duration 6 weeks    PT Treatment/Interventions ADLs/Self Care Home Management;Aquatic Therapy;Traction;Electrical Stimulation;Cryotherapy;Therapeutic activities;Therapeutic exercise;Manual techniques;Neuromuscular re-education;Patient/family education;Dry needling;Gait training;Stair training;Balance training;Moist Heat;Passive range of motion;Joint Manipulations    PT Next Visit Plan core and LE/hip strengthening- progress to endurance and functional strengthening once tolerated; lifting techniques    PT Home Exercise Plan prone hip extension; 8/25 - STS, bridge, sidelying clams 8/30 SKTC, DKTC, standing hip abd 9/1 LTR, supine scap retraction, ab brace, SLR with ab brace    Consulted and Agree with Plan of Care Patient             Patient will benefit from skilled therapeutic intervention in order to improve the following deficits and impairments:  Pain, Decreased activity tolerance, Difficulty walking, Decreased mobility, Decreased range of motion, Postural dysfunction, Decreased endurance, Decreased strength, Improper body mechanics  Visit Diagnosis: Chronic midline low back pain with right-sided sciatica  Muscle weakness (generalized)     Problem List Patient Active Problem List   Diagnosis Date Noted   Hypertension 03/21/2021   Thyroid disease 03/21/2021   Arthritis 03/21/2021   Dysphagia 05/03/2019   Idiopathic small fiber peripheral neuropathy 12/31/2017   CTS (carpal tunnel syndrome) 11/19/2017   Idiopathic polyneuropathy 11/19/2017   Varicose veins of left lower extremity with complications [id number]   Headache(784.0) 04/08/2014   Joint pain 04/08/2014   Other malaise and fatigue 04/08/2014   Neuropathy 04/08/2014   Achalasia 04/03/2014   Personal  history of colonic polyps 04/03/2014   GERD (gastroesophageal reflux disease) 04/03/2014   12:04 PM, 04/04/21 Josue Hector PT DPT  Physical Therapist with [facility]  [phone] [place], [place], [address] Phone: [phone]   Fax:  [phone]  Name: Megan Daniel MRN: [id number] Date of Birth: 1936/05/27

## 2021-04-04 NOTE — Patient Instructions (Addendum)
Access Code: [email or web address] URL: [email or web address] Date: 04/04/2021 Prepared by: Josue Hector  Exercises Supine Transversus Abdominis Bracing - Hands on Ground - 2 x daily - 7 x weekly - 1 sets - 10 reps - 5 second hold Supine Scapular Retraction - 2 x daily - 7 x weekly - 1 sets - 10 reps - second hold Small Range Straight Leg Raise - 2 x daily - 7 x weekly - 1 sets - 10 reps Supine Lower Trunk Rotation - 2 x daily - 7 x weekly - 1 sets - 5 reps - 10 second hold

## 2021-04-04 DEATH — deceased

## 2021-04-09 ENCOUNTER — Ambulatory Visit: Payer: Medicare Other | Admitting: Orthopaedic Surgery

## 2021-04-10 ENCOUNTER — Ambulatory Visit (HOSPITAL_COMMUNITY): Payer: Medicare Other | Admitting: Physical Therapy

## 2021-04-11 ENCOUNTER — Ambulatory Visit (HOSPITAL_COMMUNITY): Payer: Medicare Other | Admitting: Physical Therapy

## 2021-04-11 ENCOUNTER — Other Ambulatory Visit: Payer: Self-pay

## 2021-04-11 ENCOUNTER — Encounter (HOSPITAL_COMMUNITY): Payer: Self-pay | Admitting: Physical Therapy

## 2021-04-11 DIAGNOSIS — M5441 Lumbago with sciatica, right side: Secondary | ICD-10-CM | POA: Diagnosis not present

## 2021-04-11 DIAGNOSIS — G8929 Other chronic pain: Secondary | ICD-10-CM | POA: Diagnosis not present

## 2021-04-11 DIAGNOSIS — M6281 Muscle weakness (generalized): Secondary | ICD-10-CM | POA: Diagnosis not present

## 2021-04-11 NOTE — Patient Instructions (Signed)
Access Code: [email or web address] URL: [email or web address] Date: 04/11/2021 Prepared by: Mitzi Hansen Connor Meacham  Exercises Sit to Stand with Arms Crossed - 1 x daily - 7 x weekly - 2 sets - 10 reps

## 2021-04-11 NOTE — Therapy (Signed)
[place], [place], [address] Phone: [phone]   Fax:  [phone]  Physical Therapy Treatment  Patient Details  Name: Megan Daniel MRN: [id number] Date of Birth: [birth year] Referring Provider (PT): Arther Abbott, MD   Encounter Date: 04/11/2021   PT End of Session - 04/11/21 1056     Visit Number 5    Number of Visits 12    Date for PT Re-Evaluation 05/07/21    Authorization Type medicare part A and BCBS secondary no auth    Progress Note Due on Visit 10    PT Start Time 1055   arrives late   PT Stop Time 1125    PT Time Calculation (min) 30 min    Activity Tolerance Patient limited by fatigue;Patient tolerated treatment well    Behavior During Therapy [facility] for tasks assessed/performed             Past Medical History:  Diagnosis Date   Abdominal pain    Allergic rhinitis    Anxiety 10/03/2013   Arthralgia 03/31/2014   Bronchitis 09/09/2013   Cellulitis    Diverticulitis    Diverticulosis    Fatigue    GERD (gastroesophageal reflux disease)    Gout    Headache    Headache    Hyperchloremia    Hypercholesterolemia    Hypertension    Hypothyroidism    Lumbar strain    Major depressive disorder    Migraine    Osteoporosis    Rheumatoid arthritis ([place])     Past Surgical History:  Procedure Laterality Date   CARPAL TUNNEL RELEASE     25 years ago   CHOLECYSTECTOMY     20 yrs ago   COLONOSCOPY     COLONOSCOPY N/A 10/25/2014   Procedure: COLONOSCOPY;  Surgeon: Rogene Houston, MD;  [facility];  Service: Endoscopy;  Laterality: N/A;  [place] N/A 06/17/2019   Procedure: ESOPHAGEAL DILATION;  Surgeon: Rogene Houston, MD;  [facility];  Service: Endoscopy;  Laterality: N/A;   ESOPHAGOGASTRODUODENOSCOPY N/A 10/25/2014   Procedure: ESOPHAGOGASTRODUODENOSCOPY (EGD);  Surgeon: Rogene Houston, MD;  [facility];  Service: Endoscopy;   Laterality: N/A;   ESOPHAGOGASTRODUODENOSCOPY (EGD) WITH PROPOFOL N/A 06/17/2019   Procedure: ESOPHAGOGASTRODUODENOSCOPY (EGD) WITH PROPOFOL;  Surgeon: Rogene Houston, MD;  [facility];  Service: Endoscopy;  Laterality: N/A;  130pm   HEMORROIDECTOMY     2005   POLYPECTOMY     TOTAL ABDOMINAL HYSTERECTOMY     Age 85   UPPER GASTROINTESTINAL ENDOSCOPY      There were no vitals filed for this visit.   Subjective Assessment - 04/11/21 1057     Subjective Patient states her back is sore today from lifting too heavy.    Pertinent History anxiety, HTN, depression, osteoporosis , RA    How long can you stand comfortably? several hours - 3-4 hours    Diagnostic tests xray - scoliosis    Patient Stated Goals to be able to learn exercises to help manage her symptoms.    Currently in Pain? No/denies                               [facility] Adult PT Treatment/Exercise - 04/11/21 0001       Lumbar Exercises: Seated   Sit to Stand 10 reps  Sit to Stand Limitations 2 sets      Lumbar Exercises: Supine   Straight Leg Raise 10 reps    Straight Leg Raises Limitations with ab brace; 2 sets      Lumbar Exercises: Sidelying   Clam 20 reps      Knee/Hip Exercises: Stretches   Other Knee/Hip Stretches SKTC 3x 20 seconds bilateral                     PT Education - 04/11/21 1056     Education Details HEP    Person(s) Educated Patient    Methods Explanation    Comprehension Verbalized understanding              PT Short Term Goals - 03/26/21 1503       PT SHORT TERM GOAL #1   Title Patient will report at least 50% improvement in overall symptoms and/or function to demonstrate improved functional mobility    Time 3    Period Weeks    Status New    Target Date 04/16/21      PT SHORT TERM GOAL #2   Title Patient will be independent in self management strategies to improve quality of life and functional outcomes.    Time 3    Period  Weeks    Status New    Target Date 04/16/21      PT SHORT TERM GOAL #3   Title Patient will be able to demonstrate good sitting posture for at least 60 seconds to demonstrate improved sitting endurance    Time 3    Period Weeks    Status New    Target Date 04/16/21               PT Long Term Goals - 03/26/21 1503       PT LONG TERM GOAL #1   Title Patient will report at least 75% improvement in overall symptoms and/or function to demonstrate improved functional mobility    Time 6    Period Weeks    Status New    Target Date 05/07/21      PT LONG TERM GOAL #2   Title Patient will improve bil LE strength for limited groups by 1 grade to improve muscle endurance with activities and overall gait quality and performance with stair mobility.    Time 6    Period Weeks    Status New    Target Date 05/07/21      PT LONG TERM GOAL #3   Title Patient will mee predicted FOTO score to demonstrate improved overall function.    Time 6    Period Weeks    Status New    Target Date 05/07/21                   Plan - 04/11/21 1056     Clinical Impression Statement Session limited due to patient late arrival. Patient requires intermittent cueing for mechanics and hold items with good carry over. Patient limited with gluteal fatigue with exercises performed but is overall progressing well. Patient will continue to benefit from skilled physical therapy order to reduce impairment and improve function.    Personal Factors and Comorbidities Comorbidity 1;Comorbidity 2    Comorbidities anxiety, HTN, depression, osteoporosis , RA    Examination-Activity Limitations Bend;Squat;Stand;Reach Overhead;Locomotion Level    Examination-Participation Restrictions Occupation;Community Activity;Meal Prep;Laundry;Yard Work;Cleaning    Stability/Clinical Decision Making Stable/Uncomplicated    Rehab Potential Good    PT  Frequency 2x / week    PT Duration 6 weeks    PT Treatment/Interventions  ADLs/Self Care Home Management;Aquatic Therapy;Traction;Electrical Stimulation;Cryotherapy;Therapeutic activities;Therapeutic exercise;Manual techniques;Neuromuscular re-education;Patient/family education;Dry needling;Gait training;Stair training;Balance training;Moist Heat;Passive range of motion;Joint Manipulations    PT Next Visit Plan core and LE/hip strengthening- progress to endurance and functional strengthening once tolerated; lifting techniques    PT Home Exercise Plan prone hip extension; 8/25 - STS, bridge, sidelying clams 8/30 SKTC, DKTC, standing hip abd 9/1 LTR, supine scap retraction, ab brace, SLR with ab brace 9/8 STS    Consulted and Agree with Plan of Care Patient             Patient will benefit from skilled therapeutic intervention in order to improve the following deficits and impairments:  Pain, Decreased activity tolerance, Difficulty walking, Decreased mobility, Decreased range of motion, Postural dysfunction, Decreased endurance, Decreased strength, Improper body mechanics  Visit Diagnosis: Chronic midline low back pain with right-sided sciatica  Muscle weakness (generalized)     Problem List Patient Active Problem List   Diagnosis Date Noted   Hypertension 03/21/2021   Thyroid disease 03/21/2021   Arthritis 03/21/2021   Dysphagia 05/03/2019   Idiopathic small fiber peripheral neuropathy 12/31/2017   CTS (carpal tunnel syndrome) 11/19/2017   Idiopathic polyneuropathy 11/19/2017   Varicose veins of left lower extremity with complications XX123456   Headache(784.0) 04/08/2014   Joint pain 04/08/2014   Other malaise and fatigue 04/08/2014   Neuropathy 04/08/2014   Achalasia 04/03/2014   Personal history of colonic polyps 04/03/2014   GERD (gastroesophageal reflux disease) 04/03/2014    11:29 AM, 04/11/21 Mearl Latin PT, DPT Physical Therapist at [place], [place], [address] Phone: [phone]   Fax:  [phone]  Name: Daycee Abdul MRN: [id number] Date of Birth: 1936/04/07

## 2021-04-15 DIAGNOSIS — M8589 Other specified disorders of bone density and structure, multiple sites: Secondary | ICD-10-CM | POA: Diagnosis not present

## 2021-04-15 DIAGNOSIS — Z13820 Encounter for screening for osteoporosis: Secondary | ICD-10-CM | POA: Diagnosis not present

## 2021-04-16 ENCOUNTER — Ambulatory Visit (HOSPITAL_COMMUNITY): Payer: Medicare Other | Admitting: Physical Therapy

## 2021-04-16 ENCOUNTER — Other Ambulatory Visit: Payer: Self-pay

## 2021-04-16 DIAGNOSIS — M5441 Lumbago with sciatica, right side: Secondary | ICD-10-CM | POA: Diagnosis not present

## 2021-04-16 DIAGNOSIS — M6281 Muscle weakness (generalized): Secondary | ICD-10-CM

## 2021-04-16 DIAGNOSIS — G8929 Other chronic pain: Secondary | ICD-10-CM

## 2021-04-16 NOTE — Therapy (Signed)
[place], [place], [address] Phone: [phone]   Fax:  [phone]  Physical Therapy Treatment  Patient Details  Name: Megan Daniel MRN: [id number] Date of Birth: [birth year] Referring Provider (PT): Arther Abbott, MD   Encounter Date: 04/16/2021   PT End of Session - 04/16/21 1432     Visit Number 6    Number of Visits 12    Date for PT Re-Evaluation 05/07/21    Authorization Type medicare part A and BCBS secondary no auth    Progress Note Due on Visit 10    PT Start Time 1403    PT Stop Time 1443    PT Time Calculation (min) 40 min    Activity Tolerance Patient limited by fatigue;Patient tolerated treatment well    Behavior During Therapy [facility] for tasks assessed/performed             Past Medical History:  Diagnosis Date   Abdominal pain    Allergic rhinitis    Anxiety 10/03/2013   Arthralgia 03/31/2014   Bronchitis 09/09/2013   Cellulitis    Diverticulitis    Diverticulosis    Fatigue    GERD (gastroesophageal reflux disease)    Gout    Headache    Headache    Hyperchloremia    Hypercholesterolemia    Hypertension    Hypothyroidism    Lumbar strain    Major depressive disorder    Migraine    Osteoporosis    Rheumatoid arthritis ([place])     Past Surgical History:  Procedure Laterality Date   CARPAL TUNNEL RELEASE     25 years ago   CHOLECYSTECTOMY     20 yrs ago   COLONOSCOPY     COLONOSCOPY N/A 10/25/2014   Procedure: COLONOSCOPY;  Surgeon: Rogene Houston, MD;  [facility];  Service: Endoscopy;  Laterality: N/A;  [place] N/A 06/17/2019   Procedure: ESOPHAGEAL DILATION;  Surgeon: Rogene Houston, MD;  [facility];  Service: Endoscopy;  Laterality: N/A;   ESOPHAGOGASTRODUODENOSCOPY N/A 10/25/2014   Procedure: ESOPHAGOGASTRODUODENOSCOPY (EGD);  Surgeon: Rogene Houston, MD;  [facility];  Service: Endoscopy;  Laterality:  N/A;   ESOPHAGOGASTRODUODENOSCOPY (EGD) WITH PROPOFOL N/A 06/17/2019   Procedure: ESOPHAGOGASTRODUODENOSCOPY (EGD) WITH PROPOFOL;  Surgeon: Rogene Houston, MD;  [facility];  Service: Endoscopy;  Laterality: N/A;  130pm   HEMORROIDECTOMY     2005   POLYPECTOMY     TOTAL ABDOMINAL HYSTERECTOMY     Age 30   UPPER GASTROINTESTINAL ENDOSCOPY      There were no vitals filed for this visit.   Subjective Assessment - 04/16/21 1407     Subjective Pt states she had her bone densitiy test done yesterday and they made her lay feet turned in on a hard metal table and it aggrevated her pain.  STates she then had a 2 hour studio group meeting and was miserable with pain    Currently in Pain? Yes    Pain Score 6     Pain Location Back    Pain Orientation Right    Pain Descriptors / Indicators Aching    Pain Type Chronic pain                               [facility] Adult PT Treatment/Exercise - 04/16/21 0001  Lumbar Exercises: Stretches   Active Hamstring Stretch 3 reps;20 seconds    Active Hamstring Stretch Limitations with towel in supine    Lower Trunk Rotation 5 reps;10 seconds      Lumbar Exercises: Seated   Sit to Stand 10 reps    Sit to Stand Limitations 2 sets      Lumbar Exercises: Supine   Bent Knee Raise 20 reps    Bridge 20 reps    Bridge Limitations 2 sets of 10 reps    Straight Leg Raise 10 reps    Straight Leg Raises Limitations with ab brace; 2 sets of 5 reps                       PT Short Term Goals - 03/26/21 1503       PT SHORT TERM GOAL #1   Title Patient will report at least 50% improvement in overall symptoms and/or function to demonstrate improved functional mobility    Time 3    Period Weeks    Status New    Target Date 04/16/21      PT SHORT TERM GOAL #2   Title Patient will be independent in self management strategies to improve quality of life and functional outcomes.    Time 3    Period Weeks     Status New    Target Date 04/16/21      PT SHORT TERM GOAL #3   Title Patient will be able to demonstrate good sitting posture for at least 60 seconds to demonstrate improved sitting endurance    Time 3    Period Weeks    Status New    Target Date 04/16/21               PT Long Term Goals - 03/26/21 1503       PT LONG TERM GOAL #1   Title Patient will report at least 75% improvement in overall symptoms and/or function to demonstrate improved functional mobility    Time 6    Period Weeks    Status New    Target Date 05/07/21      PT LONG TERM GOAL #2   Title Patient will improve bil LE strength for limited groups by 1 grade to improve muscle endurance with activities and overall gait quality and performance with stair mobility.    Time 6    Period Weeks    Status New    Target Date 05/07/21      PT LONG TERM GOAL #3   Title Patient will mee predicted FOTO score to demonstrate improved overall function.    Time 6    Period Weeks    Status New    Target Date 05/07/21                   Plan - 04/16/21 1648     Clinical Impression Statement Pt with aggravated symptoms due to test and meeting yesterday.  States she also lifted something that was heavier than what she should be lifting so that may contribute as well.  States she used her heating pad and took Tylenol ant that helped.  Can tell she is getting stronger with therapy and hate she exacerbated her pain as it was a 10/10 last night.  Pt reported much improved symptoms at end of session today following exercises.    Intermittent cueing still required for general mechanics and lumbar stabilization. Pt will continue to benefit from  skilled PT in order to reduce symptoms and improve overall functioning.    Personal Factors and Comorbidities Comorbidity 1;Comorbidity 2    Comorbidities anxiety, HTN, depression, osteoporosis , RA    Examination-Activity Limitations Bend;Squat;Stand;Reach Overhead;Locomotion Level     Examination-Participation Restrictions Occupation;Community Activity;Meal Prep;Laundry;Yard Work;Cleaning    Stability/Clinical Decision Making Stable/Uncomplicated    Rehab Potential Good    PT Frequency 2x / week    PT Duration 6 weeks    PT Treatment/Interventions ADLs/Self Care Home Management;Aquatic Therapy;Traction;Electrical Stimulation;Cryotherapy;Therapeutic activities;Therapeutic exercise;Manual techniques;Neuromuscular re-education;Patient/family education;Dry needling;Gait training;Stair training;Balance training;Moist Heat;Passive range of motion;Joint Manipulations    PT Next Visit Plan core and LE/hip strengthening- progress to endurance and functional strengthening once tolerated; lifting techniques    PT Home Exercise Plan prone hip extension; 8/25 - STS, bridge, sidelying clams 8/30 SKTC, DKTC, standing hip abd 9/1 LTR, supine scap retraction, ab brace, SLR with ab brace 9/8 STS    Consulted and Agree with Plan of Care Patient             Patient will benefit from skilled therapeutic intervention in order to improve the following deficits and impairments:  Pain, Decreased activity tolerance, Difficulty walking, Decreased mobility, Decreased range of motion, Postural dysfunction, Decreased endurance, Decreased strength, Improper body mechanics  Visit Diagnosis: Chronic midline low back pain with right-sided sciatica  Muscle weakness (generalized)     Problem List Patient Active Problem List   Diagnosis Date Noted   Hypertension 03/21/2021   Thyroid disease 03/21/2021   Arthritis 03/21/2021   Dysphagia 05/03/2019   Idiopathic small fiber peripheral neuropathy 12/31/2017   CTS (carpal tunnel syndrome) 11/19/2017   Idiopathic polyneuropathy 11/19/2017   Varicose veins of left lower extremity with complications [id number]   Headache(784.0) 04/08/2014   Joint pain 04/08/2014   Other malaise and fatigue 04/08/2014   Neuropathy 04/08/2014   Achalasia 04/03/2014    Personal history of colonic polyps 04/03/2014   GERD (gastroesophageal reflux disease) 04/03/2014   Teena Irani, PTA/CLT [phone]  Teena Irani, PTA 04/16/2021, 4:49 PM  [place], [place], [address] Phone: [phone]   Fax:  [phone]  Name: Megan Daniel MRN: [id number] Date of Birth: [birth year]

## 2021-04-18 ENCOUNTER — Encounter: Payer: Self-pay | Admitting: Orthopedic Surgery

## 2021-04-18 ENCOUNTER — Ambulatory Visit (HOSPITAL_COMMUNITY): Payer: Medicare Other | Admitting: Physical Therapy

## 2021-04-18 ENCOUNTER — Other Ambulatory Visit: Payer: Self-pay

## 2021-04-18 ENCOUNTER — Ambulatory Visit (INDEPENDENT_AMBULATORY_CARE_PROVIDER_SITE_OTHER): Payer: Medicare Other | Admitting: Orthopedic Surgery

## 2021-04-18 VITALS — BP 161/88 | HR 75 | Ht 66.0 in | Wt 132.0 lb

## 2021-04-18 DIAGNOSIS — M6281 Muscle weakness (generalized): Secondary | ICD-10-CM | POA: Diagnosis not present

## 2021-04-18 DIAGNOSIS — G5602 Carpal tunnel syndrome, left upper limb: Secondary | ICD-10-CM | POA: Diagnosis not present

## 2021-04-18 DIAGNOSIS — M5441 Lumbago with sciatica, right side: Secondary | ICD-10-CM

## 2021-04-18 DIAGNOSIS — G8929 Other chronic pain: Secondary | ICD-10-CM

## 2021-04-18 NOTE — Patient Instructions (Addendum)
1 week after surgery post op appt   Your surgery will be at [facility] by Dr Aline Brochure  The hospital will contact you with a preoperative appointment to discuss Anesthesia. Please bring your medications with you for the appointment. They will tell you the arrival time and medication instructions when you have your preoperative evaluation. Do not wear nail polish the day of your surgery and if you take Phentermine you need to stop this medication ONE WEEK prior to your surgery.

## 2021-04-18 NOTE — Therapy (Signed)
[place], [place], [address] Phone: [phone]   Fax:  [phone]  Physical Therapy Treatment  Patient Details  Name: Megan Daniel MRN: [id number] Date of Birth: 1935/09/14 Referring Provider (PT): Arther Abbott, MD   Encounter Date: 04/18/2021   PT End of Session - 04/18/21 1703     Visit Number 7    Number of Visits 12    Date for PT Re-Evaluation 05/07/21    Authorization Type medicare part A and BCBS secondary no auth    Progress Note Due on Visit 10    PT Start Time 1406    PT Stop Time 1448    PT Time Calculation (min) 42 min    Activity Tolerance Patient limited by fatigue;Patient tolerated treatment well    Behavior During Therapy [facility] for tasks assessed/performed             Past Medical History:  Diagnosis Date   Abdominal pain    Allergic rhinitis    Anxiety 10/03/2013   Arthralgia 03/31/2014   Bronchitis 09/09/2013   Cellulitis    Diverticulitis    Diverticulosis    Fatigue    GERD (gastroesophageal reflux disease)    Gout    Headache    Headache    Hyperchloremia    Hypercholesterolemia    Hypertension    Hypothyroidism    Lumbar strain    Major depressive disorder    Migraine    Osteoporosis    Rheumatoid arthritis ([place])     Past Surgical History:  Procedure Laterality Date   CARPAL TUNNEL RELEASE     25 years ago   CHOLECYSTECTOMY     20 yrs ago   COLONOSCOPY     COLONOSCOPY N/A 10/25/2014   Procedure: COLONOSCOPY;  Surgeon: Rogene Houston, MD;  [facility];  Service: Endoscopy;  Laterality: N/A;  [place] N/A 06/17/2019   Procedure: ESOPHAGEAL DILATION;  Surgeon: Rogene Houston, MD;  [facility];  Service: Endoscopy;  Laterality: N/A;   ESOPHAGOGASTRODUODENOSCOPY N/A 10/25/2014   Procedure: ESOPHAGOGASTRODUODENOSCOPY (EGD);  Surgeon: Rogene Houston, MD;  [facility];  Service: Endoscopy;  Laterality:  N/A;   ESOPHAGOGASTRODUODENOSCOPY (EGD) WITH PROPOFOL N/A 06/17/2019   Procedure: ESOPHAGOGASTRODUODENOSCOPY (EGD) WITH PROPOFOL;  Surgeon: Rogene Houston, MD;  [facility];  Service: Endoscopy;  Laterality: N/A;  130pm   HEMORROIDECTOMY     2005   POLYPECTOMY     TOTAL ABDOMINAL HYSTERECTOMY     Age 18   UPPER GASTROINTESTINAL ENDOSCOPY      There were no vitals filed for this visit.   Subjective Assessment - 04/18/21 1413     Subjective pt states she is still sore and still has to take a tylenol every morning for her lower back. STates her neck is hurting a little as well.  States she had appt with Dr Aline Brochure this morning and is to have her Lt carpal tunnel surgery scheduled.   No pain in back currently    Currently in Pain? No/denies                               [facility] Adult PT Treatment/Exercise - 04/18/21 0001       Lumbar Exercises: Stretches   Active Hamstring Stretch 3 reps;20 seconds    Active Hamstring Stretch Limitations with towel in supine  Lower Trunk Rotation 5 reps;10 seconds      Lumbar Exercises: Supine   Bridge 20 reps    Straight Leg Raise 10 reps    Straight Leg Raises Limitations with ab brace; 2 sets of 5 reps      Lumbar Exercises: Sidelying   Clam 20 reps                       PT Short Term Goals - 03/26/21 1503       PT SHORT TERM GOAL #1   Title Patient will report at least 50% improvement in overall symptoms and/or function to demonstrate improved functional mobility    Time 3    Period Weeks    Status New    Target Date 04/16/21      PT SHORT TERM GOAL #2   Title Patient will be independent in self management strategies to improve quality of life and functional outcomes.    Time 3    Period Weeks    Status New    Target Date 04/16/21      PT SHORT TERM GOAL #3   Title Patient will be able to demonstrate good sitting posture for at least 60 seconds to demonstrate improved sitting  endurance    Time 3    Period Weeks    Status New    Target Date 04/16/21               PT Long Term Goals - 03/26/21 1503       PT LONG TERM GOAL #1   Title Patient will report at least 75% improvement in overall symptoms and/or function to demonstrate improved functional mobility    Time 6    Period Weeks    Status New    Target Date 05/07/21      PT LONG TERM GOAL #2   Title Patient will improve bil LE strength for limited groups by 1 grade to improve muscle endurance with activities and overall gait quality and performance with stair mobility.    Time 6    Period Weeks    Status New    Target Date 05/07/21      PT LONG TERM GOAL #3   Title Patient will mee predicted FOTO score to demonstrate improved overall function.    Time 6    Period Weeks    Status New    Target Date 05/07/21                   Plan - 04/18/21 1705     Clinical Impression Statement Pt reports overall improvement in lumbar with exercises completed last session.  These exercises continued with good form and little cuing as she completes slowly, controlled and with adequate abdominal bracing.  Pt continues to have cervical pain and dysfunction.  Suggested trying a rolled up hand towel beneath occiput at night to see if helps.  Pt will continue to benefit from skilled physical therapy.    Personal Factors and Comorbidities Comorbidity 1;Comorbidity 2    Comorbidities anxiety, HTN, depression, osteoporosis , RA    Examination-Activity Limitations Bend;Squat;Stand;Reach Overhead;Locomotion Level    Examination-Participation Restrictions Occupation;Community Activity;Meal Prep;Laundry;Yard Work;Cleaning    Stability/Clinical Decision Making Stable/Uncomplicated    Rehab Potential Good    PT Frequency 2x / week    PT Duration 6 weeks    PT Treatment/Interventions ADLs/Self Care Home Management;Aquatic Therapy;Traction;Electrical Stimulation;Cryotherapy;Therapeutic activities;Therapeutic  exercise;Manual techniques;Neuromuscular re-education;Patient/family education;Dry needling;Gait training;Stair training;Balance training;Moist  Heat;Passive range of motion;Joint Manipulations    PT Next Visit Plan core and LE/hip strengthening- progress to endurance and functional strengthening once tolerated; lifting techniques    PT Home Exercise Plan prone hip extension; 8/25 - STS, bridge, sidelying clams 8/30 SKTC, DKTC, standing hip abd 9/1 LTR, supine scap retraction, ab brace, SLR with ab brace 9/8 STS    Consulted and Agree with Plan of Care Patient             Patient will benefit from skilled therapeutic intervention in order to improve the following deficits and impairments:  Pain, Decreased activity tolerance, Difficulty walking, Decreased mobility, Decreased range of motion, Postural dysfunction, Decreased endurance, Decreased strength, Improper body mechanics  Visit Diagnosis: Chronic midline low back pain with right-sided sciatica  Muscle weakness (generalized)     Problem List Patient Active Problem List   Diagnosis Date Noted   Hypertension 03/21/2021   Thyroid disease 03/21/2021   Arthritis 03/21/2021   Dysphagia 05/03/2019   Idiopathic small fiber peripheral neuropathy 12/31/2017   CTS (carpal tunnel syndrome) 11/19/2017   Idiopathic polyneuropathy 11/19/2017   Varicose veins of left lower extremity with complications [id number]   Headache(784.0) 04/08/2014   Joint pain 04/08/2014   Other malaise and fatigue 04/08/2014   Neuropathy 04/08/2014   Achalasia 04/03/2014   Personal history of colonic polyps 04/03/2014   GERD (gastroesophageal reflux disease) 04/03/2014   Teena Irani, PTA/CLT [phone]  Teena Irani, PTA 04/18/2021, 5:05 PM  [place] [address] [place], [place], [address] Phone: [phone]   Fax:  [phone]  Name: Lilliauna Mcalpin MRN: [id number] Date of Birth:  [birth year]

## 2021-04-18 NOTE — Progress Notes (Signed)
NEW PROBLEM//OFFICE VISIT  Summary assessment and plan:   Encounter Diagnosis  Name Primary?   Carpal tunnel syndrome, left upper limb Yes    85 year old female would like to have left carpal tunnel release.  She had a nerve conduction study years ago that was positive  Based on the longevity of symptoms she understands that she may not get complete resolution of her toe numbness and tingling but hopes to get good pain relief especially at night to relieve the insomnia caused by the carpal tunnel symptoms  Chief Complaint  Patient presents with   Carpal Tunnel    Left wrist    85 year old female with rheumatoid arthritis and thyroid disease presents with 5-year history of numbness and tingling left upper extremity extreme night pain waking her up from sleep she has been braced without good relief she did have a nerve conduction study which showed carpal tunnel syndrome she is interested in carpal tunnel release    MEDICAL DECISION MAKING  A.  Encounter Diagnosis  Name Primary?   Carpal tunnel syndrome, left upper limb Yes    B. DATA ANALYSED:   IMAGING: Interpretation of images: No images  Orders: No new orders other than order left carpal tunnel release surgery  Outside records reviewed: Neurology notes from [facility] indicates the patient had a positive nerve conduction study   C. MANAGEMENT   Surgical release of the left carpal tunnel  No orders of the defined types were placed in this encounter.    BP (!) 161/88   Pulse 75   Ht '5\' 6"'$  (1.676 m)   Wt 132 lb (59.9 kg)   BMI 21.31 kg/m    General appearance: Well-developed well-nourished no gross deformities  Cardiovascular normal pulse and perfusion normal color without edema  Neurologically no sensation loss or deficits or pathologic reflexes  Psychological: Awake alert and oriented x3 mood and affect normal  Skin no lacerations or ulcerations no nodularity no palpable masses, no erythema  or nodularity  Musculoskeletal: Her left hand shows multiple areas of bone spurs around the joint she does have full range of motion normal grip no atrophy of the thenar or hyperthenar eminence she has a positive compression test at the carpal tunnel less than 15 seconds and a positive Phalen's test less than 10 seconds.  All of the fingers are showed numbness to her but she has intact sensation in the office.   ROS Chronic back pain no chest pain shortness of breath fever or chills  Past Medical History:  Diagnosis Date   Abdominal pain    Allergic rhinitis    Anxiety 10/03/2013   Arthralgia 03/31/2014   Bronchitis 09/09/2013   Cellulitis    Diverticulitis    Diverticulosis    Fatigue    GERD (gastroesophageal reflux disease)    Gout    Headache    Headache    Hyperchloremia    Hypercholesterolemia    Hypertension    Hypothyroidism    Lumbar strain    Major depressive disorder    Migraine    Osteoporosis    Rheumatoid arthritis ([place])     Past Surgical History:  Procedure Laterality Date   CARPAL TUNNEL RELEASE     25 years ago   CHOLECYSTECTOMY     20 yrs ago   COLONOSCOPY     COLONOSCOPY N/A 10/25/2014   Procedure: COLONOSCOPY;  Surgeon: Rogene Houston, MD;  [facility];  Service: Endoscopy;  Laterality: N/A;  830  ESOPHAGEAL DILATION N/A 06/17/2019   Procedure: ESOPHAGEAL DILATION;  Surgeon: Rogene Houston, MD;  [facility];  Service: Endoscopy;  Laterality: N/A;   ESOPHAGOGASTRODUODENOSCOPY N/A 10/25/2014   Procedure: ESOPHAGOGASTRODUODENOSCOPY (EGD);  Surgeon: Rogene Houston, MD;  [facility];  Service: Endoscopy;  Laterality: N/A;   ESOPHAGOGASTRODUODENOSCOPY (EGD) WITH PROPOFOL N/A 06/17/2019   Procedure: ESOPHAGOGASTRODUODENOSCOPY (EGD) WITH PROPOFOL;  Surgeon: Rogene Houston, MD;  [facility];  Service: Endoscopy;  Laterality: N/A;  130pm   HEMORROIDECTOMY     2005   POLYPECTOMY     TOTAL ABDOMINAL  HYSTERECTOMY     Age 36   UPPER GASTROINTESTINAL ENDOSCOPY      Family History  Problem Relation Age of Onset   Alzheimer's disease Father    Alzheimer's disease Sister    Macular degeneration Sister    Healthy Brother    Arthritis Sister    Arthritis Sister    COPD Sister    Heart disease Mother    Seizures Sister    Seizures Other        nephew   Liver cancer Other    Alcohol abuse Sister    Social History   Tobacco Use   Smoking status: Former    Packs/day: 0.50    Types: Cigarettes    Quit date: 10/2016    Years since quitting: 4.5   Smokeless tobacco: Never   Tobacco comments:    Quit 16 days ago after smoking for off and on for years  Vaping Use   Vaping Use: Never used  Substance Use Topics   Alcohol use: Not Currently    Alcohol/week: 0.0 standard drinks    Comment: 4x per week, 1/2 glass of wine   Drug use: No    Allergies  Allergen Reactions   Dexmedetomidine    Diazepam    Ibuprofen Other (See Comments)    Heartburn "intolerance"   Iodine    Penicillin G     Current Meds  Medication Sig   B Complex-C (SUPER B COMPLEX PO) Take by mouth.   Cholecalciferol (VITAMIN D3 PO) Take 2,000 Units by mouth.   famotidine (PEPCID) 40 MG tablet TAKE 1 TABLET BY MOUTH AT BEDTIME   levothyroxine (SYNTHROID, LEVOTHROID) 100 MCG tablet Take 100 mcg by mouth daily before breakfast.   LUTEIN PO Take by mouth in the morning and at bedtime.   Multiple Vitamins-Minerals (PRESERVISION/LUTEIN PO) Take 1 tablet by mouth daily.    Probiotic Product (PROBIOTIC DAILY PO) Take 1 tablet by mouth daily. Sometimes she will take twice a day.    pyridOXINE (VITAMIN B-6) 100 MG tablet Take 100 mg by mouth daily.   traMADol (ULTRAM) 50 MG tablet Take 1 tablet (50 mg total) by mouth every 6 (six) hours as needed. (Patient taking differently: Take 50 mg by mouth every 6 (six) hours as needed for moderate pain.)   triamcinolone cream (KENALOG) 0.1 % Apply 1 application topically 2  (two) times daily.   TURMERIC PO Take 1,000 mg by mouth daily.   verapamil (CALAN-SR) 240 MG CR tablet Take 240 mg by mouth daily.   [DISCONTINUED] hydrochlorothiazide (HYDRODIURIL) 12.5 MG tablet Take 12.5 mg by mouth daily.   [DISCONTINUED] pimecrolimus (ELIDEL) 1 % cream Apply 1 application topically daily as needed (itching ear canal).        Arther Abbott, MD  04/18/2021 12:26 PM

## 2021-04-23 ENCOUNTER — Other Ambulatory Visit: Payer: Self-pay

## 2021-04-23 ENCOUNTER — Ambulatory Visit (HOSPITAL_COMMUNITY): Payer: Medicare Other | Admitting: Physical Therapy

## 2021-04-23 DIAGNOSIS — M6281 Muscle weakness (generalized): Secondary | ICD-10-CM | POA: Diagnosis not present

## 2021-04-23 DIAGNOSIS — M5441 Lumbago with sciatica, right side: Secondary | ICD-10-CM | POA: Diagnosis not present

## 2021-04-23 DIAGNOSIS — G8929 Other chronic pain: Secondary | ICD-10-CM | POA: Diagnosis not present

## 2021-04-23 NOTE — Therapy (Signed)
[facility] [place], [place], [address] Phone: [phone]   Fax:  [phone]  Physical Therapy Treatment  Patient Details  Name: Megan Daniel MRN: [id number] Date of Birth: 1935-12-20 Referring Provider (PT): Arther Abbott, MD   Encounter Date: 04/23/2021   PT End of Session - 04/23/21 1512     Visit Number 8    Number of Visits 12    Date for PT Re-Evaluation 05/07/21    Authorization Type medicare part A and BCBS secondary no auth    Progress Note Due on Visit 10    PT Start Time 1450    PT Stop Time 1528    PT Time Calculation (min) 38 min    Activity Tolerance Patient limited by fatigue;Patient tolerated treatment well    Behavior During Therapy [facility] for tasks assessed/performed             Past Medical History:  Diagnosis Date   Abdominal pain    Allergic rhinitis    Anxiety 10/03/2013   Arthralgia 03/31/2014   Bronchitis 09/09/2013   Cellulitis    Diverticulitis    Diverticulosis    Fatigue    GERD (gastroesophageal reflux disease)    Gout    Headache    Headache    Hyperchloremia    Hypercholesterolemia    Hypertension    Hypothyroidism    Lumbar strain    Major depressive disorder    Migraine    Osteoporosis    Rheumatoid arthritis ([place])     Past Surgical History:  Procedure Laterality Date   CARPAL TUNNEL RELEASE     25 years ago   CHOLECYSTECTOMY     20 yrs ago   COLONOSCOPY     COLONOSCOPY N/A 10/25/2014   Procedure: COLONOSCOPY;  Surgeon: Rogene Houston, MD;  [facility];  Service: Endoscopy;  Laterality: N/A;  [place] N/A 06/17/2019   Procedure: ESOPHAGEAL DILATION;  Surgeon: Rogene Houston, MD;  [facility];  Service: Endoscopy;  Laterality: N/A;   ESOPHAGOGASTRODUODENOSCOPY N/A 10/25/2014   Procedure: ESOPHAGOGASTRODUODENOSCOPY (EGD);  Surgeon: Rogene Houston, MD;  [facility];  Service: Endoscopy;  Laterality:  N/A;   ESOPHAGOGASTRODUODENOSCOPY (EGD) WITH PROPOFOL N/A 06/17/2019   Procedure: ESOPHAGOGASTRODUODENOSCOPY (EGD) WITH PROPOFOL;  Surgeon: Rogene Houston, MD;  [facility];  Service: Endoscopy;  Laterality: N/A;  130pm   HEMORROIDECTOMY     2005   POLYPECTOMY     TOTAL ABDOMINAL HYSTERECTOMY     Age 25   UPPER GASTROINTESTINAL ENDOSCOPY      There were no vitals filed for this visit.   Subjective Assessment - 04/23/21 1456     Subjective Pt states she is not having any pain today and has been compliant with HEP.    Currently in Pain? No/denies                               [facility] Adult PT Treatment/Exercise - 04/23/21 0001       Lumbar Exercises: Seated   Sit to Stand 10 reps    Sit to Stand Limitations 2 sets      Lumbar Exercises: Supine   Bent Knee Raise 20 reps    Bridge 20 reps    Straight Leg Raise 10 reps    Straight Leg Raises Limitations with ab brace; 2 sets of 5  reps      Lumbar Exercises: Sidelying   Clam 20 reps    Hip Abduction Both;10 reps                       PT Short Term Goals - 03/26/21 1503       PT SHORT TERM GOAL #1   Title Patient will report at least 50% improvement in overall symptoms and/or function to demonstrate improved functional mobility    Time 3    Period Weeks    Status New    Target Date 04/16/21      PT SHORT TERM GOAL #2   Title Patient will be independent in self management strategies to improve quality of life and functional outcomes.    Time 3    Period Weeks    Status New    Target Date 04/16/21      PT SHORT TERM GOAL #3   Title Patient will be able to demonstrate good sitting posture for at least 60 seconds to demonstrate improved sitting endurance    Time 3    Period Weeks    Status New    Target Date 04/16/21               PT Long Term Goals - 03/26/21 1503       PT LONG TERM GOAL #1   Title Patient will report at least 75% improvement in overall  symptoms and/or function to demonstrate improved functional mobility    Time 6    Period Weeks    Status New    Target Date 05/07/21      PT LONG TERM GOAL #2   Title Patient will improve bil LE strength for limited groups by 1 grade to improve muscle endurance with activities and overall gait quality and performance with stair mobility.    Time 6    Period Weeks    Status New    Target Date 05/07/21      PT LONG TERM GOAL #3   Title Patient will mee predicted FOTO score to demonstrate improved overall function.    Time 6    Period Weeks    Status New    Target Date 05/07/21                   Plan - 04/23/21 1520     Clinical Impression Statement Pt overall improving with minimal lower back pain . Only taking 2 tylenol (one in morning, one in evening) to help keep pain at [place].  Having carpal tunnel surgery on 9/30.  Pt able to recall most of established therex without cues from therapist.  Added hip abduction in side lying with cues for form.  Reports using the rolled hand towel has helped with her cervical pain but sometimes comes unrolled.  Demonstrated placing the rolled towel inside the pillowcase to keep it contained.  Also shown contoured pillow at [facility] or [employer] she could purchase if this does not work.   Pt reports having difficulty pulling weeds from garden but no difficulty completing any of her housework.  Discussed possibility of UE weakness and overcompensation; will add postural/UE strengthening next session.    Personal Factors and Comorbidities Comorbidity 1;Comorbidity 2    Comorbidities anxiety, HTN, depression, osteoporosis , RA    Examination-Activity Limitations Bend;Squat;Stand;Reach Overhead;Locomotion Level    Examination-Participation Restrictions Occupation;Community Activity;Meal Prep;Laundry;Yard Work;Cleaning    Stability/Clinical Decision Making Stable/Uncomplicated    Rehab Potential Good  PT Frequency 2x / week    PT Duration 6 weeks     PT Treatment/Interventions ADLs/Self Care Home Management;Aquatic Therapy;Traction;Electrical Stimulation;Cryotherapy;Therapeutic activities;Therapeutic exercise;Manual techniques;Neuromuscular re-education;Patient/family education;Dry needling;Gait training;Stair training;Balance training;Moist Heat;Passive range of motion;Joint Manipulations    PT Next Visit Plan core and LE/hip strengthening- progress to endurance and functional strengthening once tolerated; lifting techniques.  next session begin postural theraband strengthening and standing functional strengthening.    PT Home Exercise Plan prone hip extension; 8/25 - STS, bridge, sidelying clams 8/30 SKTC, DKTC, standing hip abd 9/1 LTR, supine scap retraction, ab brace, SLR with ab brace 9/8 STS    Consulted and Agree with Plan of Care Patient             Patient will benefit from skilled therapeutic intervention in order to improve the following deficits and impairments:  Pain, Decreased activity tolerance, Difficulty walking, Decreased mobility, Decreased range of motion, Postural dysfunction, Decreased endurance, Decreased strength, Improper body mechanics  Visit Diagnosis: Chronic midline low back pain with right-sided sciatica  Muscle weakness (generalized)     Problem List Patient Active Problem List   Diagnosis Date Noted   Hypertension 03/21/2021   Thyroid disease 03/21/2021   Arthritis 03/21/2021   Dysphagia 05/03/2019   Idiopathic small fiber peripheral neuropathy 12/31/2017   CTS (carpal tunnel syndrome) 11/19/2017   Idiopathic polyneuropathy 11/19/2017   Varicose veins of left lower extremity with complications 00/93/8182   Headache(784.0) 04/08/2014   Joint pain 04/08/2014   Other malaise and fatigue 04/08/2014   Neuropathy 04/08/2014   Achalasia 04/03/2014   Personal history of colonic polyps 04/03/2014   GERD (gastroesophageal reflux disease) 04/03/2014   Teena Irani, PTA/CLT [phone]  Teena Irani, PTA 04/23/2021, 3:30 PM  [place] [address] [place], [place], [address] Phone: [phone]   Fax:  [phone]  Name: Megan Daniel MRN: [id number] Date of Birth: 1936/05/07

## 2021-04-25 ENCOUNTER — Ambulatory Visit (HOSPITAL_COMMUNITY): Payer: Medicare Other | Admitting: Physical Therapy

## 2021-04-25 ENCOUNTER — Other Ambulatory Visit: Payer: Self-pay

## 2021-04-25 DIAGNOSIS — M5441 Lumbago with sciatica, right side: Secondary | ICD-10-CM | POA: Diagnosis not present

## 2021-04-25 DIAGNOSIS — G8929 Other chronic pain: Secondary | ICD-10-CM | POA: Diagnosis not present

## 2021-04-25 DIAGNOSIS — M6281 Muscle weakness (generalized): Secondary | ICD-10-CM | POA: Diagnosis not present

## 2021-04-25 DIAGNOSIS — Z6824 Body mass index (BMI) 24.0-24.9, adult: Secondary | ICD-10-CM | POA: Diagnosis not present

## 2021-04-25 DIAGNOSIS — S161XXA Strain of muscle, fascia and tendon at neck level, initial encounter: Secondary | ICD-10-CM | POA: Diagnosis not present

## 2021-04-25 DIAGNOSIS — M810 Age-related osteoporosis without current pathological fracture: Secondary | ICD-10-CM | POA: Diagnosis not present

## 2021-04-25 NOTE — Therapy (Addendum)
[facility] [address] [place], [place], [address] Phone: [phone]   Fax:  [phone]  Physical Therapy Treatment/Discharge Summary  Patient Details  Name: Megan Daniel MRN: [id number] Date of Birth: 11/08/1935 Referring Provider (PT): Arther Abbott, MD   Encounter Date: 04/25/2021  PHYSICAL THERAPY DISCHARGE SUMMARY  Visits from Start of Care: 9  Current functional level related to goals / functional outcomes: See below   Remaining deficits: See below   Education / Equipment: See below   Patient agrees to discharge. Patient goals were met. Patient is being discharged due to meeting the stated rehab goals. 9:45 AM, 04/29/21 Mearl Latin PT, DPT Physical Therapist at [facility]     PT End of Session - 04/25/21 1429     Visit Number 9    Number of Visits 12    Date for PT Re-Evaluation 05/07/21    Authorization Type medicare part A and BCBS secondary no auth    Progress Note Due on Visit 10    PT Start Time 1400    PT Stop Time 1440    PT Time Calculation (min) 40 min    Activity Tolerance Patient limited by fatigue;Patient tolerated treatment well    Behavior During Therapy [facility] for tasks assessed/performed             Past Medical History:  Diagnosis Date   Abdominal pain    Allergic rhinitis    Anxiety 10/03/2013   Arthralgia 03/31/2014   Bronchitis 09/09/2013   Cellulitis    Diverticulitis    Diverticulosis    Fatigue    GERD (gastroesophageal reflux disease)    Gout    Headache    Headache    Hyperchloremia    Hypercholesterolemia    Hypertension    Hypothyroidism    Lumbar strain    Major depressive disorder    Migraine    Osteoporosis    Rheumatoid arthritis ([place])     Past Surgical History:  Procedure Laterality Date   CARPAL TUNNEL RELEASE     25 years ago   CHOLECYSTECTOMY     20 yrs ago   COLONOSCOPY     COLONOSCOPY N/A 10/25/2014    Procedure: COLONOSCOPY;  Surgeon: Rogene Houston, MD;  [facility];  Service: Endoscopy;  Laterality: N/A;  [place] N/A 06/17/2019   Procedure: ESOPHAGEAL DILATION;  Surgeon: Rogene Houston, MD;  [facility];  Service: Endoscopy;  Laterality: N/A;   ESOPHAGOGASTRODUODENOSCOPY N/A 10/25/2014   Procedure: ESOPHAGOGASTRODUODENOSCOPY (EGD);  Surgeon: Rogene Houston, MD;  [facility];  Service: Endoscopy;  Laterality: N/A;   ESOPHAGOGASTRODUODENOSCOPY (EGD) WITH PROPOFOL N/A 06/17/2019   Procedure: ESOPHAGOGASTRODUODENOSCOPY (EGD) WITH PROPOFOL;  Surgeon: Rogene Houston, MD;  [facility];  Service: Endoscopy;  Laterality: N/A;  130pm   HEMORROIDECTOMY     2005   POLYPECTOMY     TOTAL ABDOMINAL HYSTERECTOMY     Age 85   UPPER GASTROINTESTINAL ENDOSCOPY      There were no vitals filed for this visit.   Subjective Assessment - 04/25/21 1408     Subjective pt states she had lunch with her sisters today.  STates she is only having neck pain; no back pain.    Currently in Pain? No/denies    Multiple Pain Sites Yes    Pain Location Neck  [facility] PT Assessment - 04/25/21 1418       Assessment   Medical Diagnosis LBP radiating to the right knee    Referring Provider (PT) Arther Abbott, MD      [place] residence      Prior Function   Level of Independence Independent      Observation/Other Assessments   Focus on Therapeutic Outcomes (FOTO)  % functional status   was 43.3 % functional status     AROM   Overall AROM Comments no lumbar symptoms noted with lx ROM    Right Hip Extension 20   was 15 degrees   Left Hip Extension 20   was 15 degrees   Lumbar Flexion WNL   was 25% limited   Lumbar Extension WNL   was 75% limited   Lumbar - Right Side Bend WNL   was 25% limited   Lumbar - Left Side Bend WNL   was 25% limited     Strength   Right Hip Flexion 5/5    was 4-   Right Hip Extension 4+/5   was 3/5   Left Hip Flexion 5/5   was 4-   Left Hip Extension 4+/5   was 3/5   Right Knee Flexion 5/5   was 3+   Right Knee Extension 5/5   was 4-   Left Knee Flexion 5/5   was 3+   Left Knee Extension 5/5   was 4-   Right Ankle Dorsiflexion 5/5   was4+   Left Ankle Dorsiflexion 5/5   was 4+                                     PT Short Term Goals - 04/25/21 1459       PT SHORT TERM GOAL #1   Title Patient will report at least 50% improvement in overall symptoms and/or function to demonstrate improved functional mobility    Time 3    Period Weeks    Status Achieved    Target Date 04/16/21      PT SHORT TERM GOAL #2   Title Patient will be independent in self management strategies to improve quality of life and functional outcomes.    Time 3    Period Weeks    Status Achieved    Target Date 04/16/21      PT SHORT TERM GOAL #3   Title Patient will be able to demonstrate good sitting posture for at least 60 seconds to demonstrate improved sitting endurance    Time 3    Period Weeks    Status Achieved    Target Date 04/16/21               PT Long Term Goals - 04/25/21 1459       PT LONG TERM GOAL #1   Title Patient will report at least 75% improvement in overall symptoms and/or function to demonstrate improved functional mobility    Time 6    Period Weeks    Status Achieved      PT LONG TERM GOAL #2   Title Patient will improve bil LE strength for limited groups by 1 grade to improve muscle endurance with activities and overall gait quality and performance with stair mobility.    Time 6    Period Weeks    Status Achieved      PT  LONG TERM GOAL #3   Title Patient will mee predicted FOTO score to demonstrate improved overall function.    Time 6    Period Weeks    Status Achieved                   Plan - 04/25/21 1500     Clinical Impression Statement Pt has not had any lower back pain  in the last 2 weeks.  She is independent with HEP and plans on returning to yoga after recovery from her carpal tunnel surgery next week.  Pt has met all goals and has no further skilled therapy needs at this time.  Pt agreeable to discharge at this time.    Personal Factors and Comorbidities Comorbidity 1;Comorbidity 2    Comorbidities anxiety, HTN, depression, osteoporosis , RA    Examination-Activity Limitations Bend;Squat;Stand;Reach Overhead;Locomotion Level    Examination-Participation Restrictions Occupation;Community Activity;Meal Prep;Laundry;Yard Work;Cleaning    Stability/Clinical Decision Making Stable/Uncomplicated    Rehab Potential Good    PT Frequency 2x / week    PT Duration 6 weeks    PT Treatment/Interventions ADLs/Self Care Home Management;Aquatic Therapy;Traction;Electrical Stimulation;Cryotherapy;Therapeutic activities;Therapeutic exercise;Manual techniques;Neuromuscular re-education;Patient/family education;Dry needling;Gait training;Stair training;Balance training;Moist Heat;Passive range of motion;Joint Manipulations    PT Next Visit Plan discharge    PT Home Exercise Plan prone hip extension; 8/25 - STS, bridge, sidelying clams 8/30 SKTC, DKTC, standing hip abd 9/1 LTR, supine scap retraction, ab brace, SLR with ab brace 9/8 STS    Consulted and Agree with Plan of Care Patient             Patient will benefit from skilled therapeutic intervention in order to improve the following deficits and impairments:  Pain, Decreased activity tolerance, Difficulty walking, Decreased mobility, Decreased range of motion, Postural dysfunction, Decreased endurance, Decreased strength, Improper body mechanics  Visit Diagnosis: Chronic midline low back pain with right-sided sciatica  Muscle weakness (generalized)     Problem List Patient Active Problem List   Diagnosis Date Noted   Hypertension 03/21/2021   Thyroid disease 03/21/2021   Arthritis 03/21/2021   Dysphagia  05/03/2019   Idiopathic small fiber peripheral neuropathy 12/31/2017   CTS (carpal tunnel syndrome) 11/19/2017   Idiopathic polyneuropathy 11/19/2017   Varicose veins of left lower extremity with complications 37/11/8887   Headache(784.0) 04/08/2014   Joint pain 04/08/2014   Other malaise and fatigue 04/08/2014   Neuropathy 04/08/2014   Achalasia 04/03/2014   Personal history of colonic polyps 04/03/2014   GERD (gastroesophageal reflux disease) 04/03/2014   Megan Daniel, PTA/CLT [phone]  Megan Daniel, PTA 04/25/2021, 3:03 PM  [place], [place], [address] Phone: [phone]   Fax:  [phone]  Name: Megan Daniel MRN: [id number] Date of Birth: Jan 25, 1936

## 2021-04-29 NOTE — Patient Instructions (Signed)
Open Carpal Tunnel Release Open carpal tunnel release is a surgery to relieve symptoms caused by carpal tunnel syndrome. The carpal tunnel is a narrow, rigid space in the wrist. It is located between the wrist bones and a band of connective tissue (transverse carpal ligament). The nerve that supplies most of the hand (median nerve) passes through the carpal tunnel, and so do tissues that connect bones to muscles (tendons) in the hand and arm. Carpal tunnel syndrome makes this space swell and become narrow. The swelling pinches the median nerve and causes pain, numbness, tingling, and weakness in the hand. During carpal tunnel release surgery, the transverse carpal ligament is cut to make more room in the carpal tunnel space. This also lessens the pressure on the median nerve. You may have this surgery if other types of treatment have not relieved your carpal tunnel symptoms. This surgery is usually done only for the hand that you use more often (dominant hand), but it may be done for both hands depending on your symptoms. Tell a health care provider about: Any allergies you have. All medicines you are taking, including vitamins, herbs, eye drops, creams, and over-the-counter medicines. Any problems you or family members have had with anesthetic medicines. Any blood disorders you have. Any surgeries you have had. Any medical conditions you have. Whether you are pregnant or may be pregnant. What are the risks? Generally, this is a safe procedure. However, problems may occur, including: Infection. Bleeding. Injury to the median nerve. Allergic reactions to medicines. The surgery failing to relieve your symptoms, or making your symptoms worse. What happens before the procedure? Staying hydrated    Megan Daniel  [birth year]     @PREFPERIOPPHARMACY @   Your procedure is scheduled on 05/03/2021.   Report to [facility] at  [phone] A.M.   Call this number if you have problems the morning  of surgery:  [phone]   Remember:  Do not eat or drink after midnight.      Take these medicines the morning of surgery with A SIP OF WATER          pepcid, levothyroxine, mobic or tramadol (if needed).    Do not wear jewelry, make-up or nail polish.  Do not wear lotions, powders, or perfumes, or deodorant.  Do not shave 48 hours prior to surgery.  Men may shave face and neck.  Do not bring valuables to the hospital.  [facility] is not responsible for any belongings or valuables.  Contacts, dentures or bridgework may not be worn into surgery.  Leave your suitcase in the car.  After surgery it may be brought to your room.  For patients admitted to the hospital, discharge time will be determined by your treatment team.  Patients discharged the day of surgery will not be allowed to drive home and must have someone with them for 24 hours.    Special instructions:   DO NOT smoke tobacco or vape for 24 hours before your procedure.  Please read over the following fact sheets that you were given. Pain Booklet, Surgical Site Infection Prevention, Anesthesia Post-op Instructions, and Care and Recovery After Surgery       Medicines Ask your health care provider about: Changing or stopping your regular medicines. This is especially important if you are taking diabetes medicines or blood thinners. Taking medicines such as aspirin and ibuprofen. These medicines can thin your blood. Do not take these medicines unless your health care provider tells you to  take them. Taking over-the-counter medicines, vitamins, herbs, and supplements. General instructions Do not use any products that contain nicotine or tobacco for at least 4 weeks before the procedure. These products include cigarettes, e-cigarettes, and chewing tobacco. If you need help quitting, ask your health care provider. Plan to have a responsible adult take you home from the hospital or clinic. If you will be going home right  after the procedure, plan to have a responsible adult care for you for the time you are told. This is important. Ask your health care provider: How your surgery site will be marked. What steps will be taken to help prevent infection. These steps may include washing the skin with a germ-killing soap. What happens during the procedure?  An IV will be inserted into one of your veins. You will be given one or more of the following: A medicine to help you relax (sedative). A medicine to numb the wrist area (local anesthetic). A medicine that is injected to numb everything below the injection site (regional anesthetic). An incision will be made in your wrist, on the same side as your palm. The skin of your wrist will be spread to expose the transverse carpal ligament. The transverse carpal ligament will be cut to make more room in the carpal tunnel space. Your incision will be closed with stitches (sutures) or staples. A compression bandage (dressing) will be wrapped around your hand and wrist. The procedure may vary among health care providers and hospitals. What happens after the procedure? Your blood pressure, heart rate, breathing rate, and blood oxygen level will be monitored until you leave the hospital or clinic. You will be given pain medicine as needed. A splint or brace may be placed over your dressing, to hold your hand and wrist in place while you heal. Do not drive until your health care provider approves. Summary Carpal tunnel release is a surgery to relieve pain and numbness in the hand caused by swelling around a nerve (carpal tunnel syndrome). You may have this surgery if other types of treatment have not relieved your carpal tunnel symptoms. During carpal tunnel release surgery, a band of connective tissue (transverse carpal ligament) is cut to make more room in the carpal tunnel space. This information is not intended to replace advice given to you by your health care provider.  Make sure you discuss any questions you have with your health care provider. Document Revised: 12/06/2019 Document Reviewed: 11/24/2019 Elsevier Patient Education  2022 [place] Anesthesia, Adult, Care After This sheet gives you information about how to care for yourself after your procedure. Your health care provider may also give you more specific instructions. If you have problems or questions, contact your health care provider. What can I expect after the procedure? After the procedure, the following side effects are common: Pain or discomfort at the IV site. Nausea. Vomiting. Sore throat. Trouble concentrating. Feeling cold or chills. Feeling weak or tired. Sleepiness and fatigue. Soreness and body aches. These side effects can affect parts of the body that were not involved in surgery. Follow these instructions at home: For the time period you were told by your health care provider:  Rest. Do not participate in activities where you could fall or become injured. Do not drive or use machinery. Do not drink alcohol. Do not take sleeping pills or medicines that cause drowsiness. Do not make important decisions or sign legal documents. Do not take care of children on your own. Eating and drinking Follow  any instructions from your health care provider about eating or drinking restrictions. When you feel hungry, start by eating small amounts of foods that are soft and easy to digest (bland), such as toast. Gradually return to your regular diet. Drink enough fluid to keep your urine pale yellow. If you vomit, rehydrate by drinking water, juice, or clear broth. General instructions If you have sleep apnea, surgery and certain medicines can increase your risk for breathing problems. Follow instructions from your health care provider about wearing your sleep device: Anytime you are sleeping, including during daytime naps. While taking prescription pain medicines, sleeping  medicines, or medicines that make you drowsy. Have a responsible adult stay with you for the time you are told. It is important to have someone help care for you until you are awake and alert. Return to your normal activities as told by your health care provider. Ask your health care provider what activities are safe for you. Take over-the-counter and prescription medicines only as told by your health care provider. If you smoke, do not smoke without supervision. Keep all follow-up visits as told by your health care provider. This is important. Contact a health care provider if: You have nausea or vomiting that does not get better with medicine. You cannot eat or drink without vomiting. You have pain that does not get better with medicine. You are unable to pass urine. You develop a skin rash. You have a fever. You have redness around your IV site that gets worse. Get help right away if: You have difficulty breathing. You have chest pain. You have blood in your urine or stool, or you vomit blood. Summary After the procedure, it is common to have a sore throat or nausea. It is also common to feel tired. Have a responsible adult stay with you for the time you are told. It is important to have someone help care for you until you are awake and alert. When you feel hungry, start by eating small amounts of foods that are soft and easy to digest (bland), such as toast. Gradually return to your regular diet. Drink enough fluid to keep your urine pale yellow. Return to your normal activities as told by your health care provider. Ask your health care provider what activities are safe for you. This information is not intended to replace advice given to you by your health care provider. Make sure you discuss any questions you have with your health care provider. Document Revised: 04/05/2020 Document Reviewed: 11/03/2019 Elsevier Patient Education  2022 [place]. How to Use Chlorhexidine for  Bathing Chlorhexidine gluconate (CHG) is a germ-killing (antiseptic) solution that is used to clean the skin. It can get rid of the bacteria that normally live on the skin and can keep them away for about 24 hours. To clean your skin with CHG, you may be given: A CHG solution to use in the shower or as part of a sponge bath. A prepackaged cloth that contains CHG. Cleaning your skin with CHG may help lower the risk for infection: While you are staying in the intensive care unit of the hospital. If you have a vascular access, such as a central line, to provide short-term or long-term access to your veins. If you have a catheter to drain urine from your bladder. If you are on a ventilator. A ventilator is a machine that helps you breathe by moving air in and out of your lungs. After surgery. What are the risks? Risks of using CHG  include: A skin reaction. Hearing loss, if CHG gets in your ears and you have a perforated eardrum. Eye injury, if CHG gets in your eyes and is not rinsed out. The CHG product catching fire. Make sure that you avoid smoking and flames after applying CHG to your skin. Do not use CHG: If you have a chlorhexidine allergy or have previously reacted to chlorhexidine. On babies younger than 30 months of age. How to use CHG solution Use CHG only as told by your health care provider, and follow the instructions on the label. Use the full amount of CHG as directed. Usually, this is one bottle. During a shower Follow these steps when using CHG solution during a shower (unless your health care provider gives you different instructions): Start the shower. Use your normal soap and shampoo to wash your face and hair. Turn off the shower or move out of the shower stream. Pour the CHG onto a clean washcloth. Do not use any type of brush or rough-edged sponge. Starting at your neck, lather your body down to your toes. Make sure you follow these instructions: If you will be having  surgery, pay special attention to the part of your body where you will be having surgery. Scrub this area for at least 1 minute. Do not use CHG on your head or face. If the solution gets into your ears or eyes, rinse them well with water. Avoid your genital area. Avoid any areas of skin that have broken skin, cuts, or scrapes. Scrub your back and under your arms. Make sure to wash skin folds. Let the lather sit on your skin for 1-2 minutes or as long as told by your health care provider. Thoroughly rinse your entire body in the shower. Make sure that all body creases and crevices are rinsed well. Dry off with a clean towel. Do not put any substances on your body afterward--such as powder, lotion, or perfume--unless you are told to do so by your health care provider. Only use lotions that are recommended by the manufacturer. Put on clean clothes or pajamas. If it is the night before your surgery, sleep in clean sheets.  During a sponge bath Follow these steps when using CHG solution during a sponge bath (unless your health care provider gives you different instructions): Use your normal soap and shampoo to wash your face and hair. Pour the CHG onto a clean washcloth. Starting at your neck, lather your body down to your toes. Make sure you follow these instructions: If you will be having surgery, pay special attention to the part of your body where you will be having surgery. Scrub this area for at least 1 minute. Do not use CHG on your head or face. If the solution gets into your ears or eyes, rinse them well with water. Avoid your genital area. Avoid any areas of skin that have broken skin, cuts, or scrapes. Scrub your back and under your arms. Make sure to wash skin folds. Let the lather sit on your skin for 1-2 minutes or as long as told by your health care provider. Using a different clean, wet washcloth, thoroughly rinse your entire body. Make sure that all body creases and crevices are  rinsed well. Dry off with a clean towel. Do not put any substances on your body afterward--such as powder, lotion, or perfume--unless you are told to do so by your health care provider. Only use lotions that are recommended by the manufacturer. Put on clean clothes or  pajamas. If it is the night before your surgery, sleep in clean sheets. How to use CHG prepackaged cloths Only use CHG cloths as told by your health care provider, and follow the instructions on the label. Use the CHG cloth on clean, dry skin. Do not use the CHG cloth on your head or face unless your health care provider tells you to. When washing with the CHG cloth: Avoid your genital area. Avoid any areas of skin that have broken skin, cuts, or scrapes. Before surgery Follow these steps when using a CHG cloth to clean before surgery (unless your health care provider gives you different instructions): Using the CHG cloth, vigorously scrub the part of your body where you will be having surgery. Scrub using a back-and-forth motion for 3 minutes. The area on your body should be completely wet with CHG when you are done scrubbing. Do not rinse. Discard the cloth and let the area air-dry. Do not put any substances on the area afterward, such as powder, lotion, or perfume. Put on clean clothes or pajamas. If it is the night before your surgery, sleep in clean sheets.  For general bathing Follow these steps when using CHG cloths for general bathing (unless your health care provider gives you different instructions). Use a separate CHG cloth for each area of your body. Make sure you wash between any folds of skin and between your fingers and toes. Wash your body in the following order, switching to a new cloth after each step: The front of your neck, shoulders, and chest. Both of your arms, under your arms, and your hands. Your stomach and groin area, avoiding the genitals. Your right leg and foot. Your left leg and foot. The back of  your neck, your back, and your buttocks. Do not rinse. Discard the cloth and let the area air-dry. Do not put any substances on your body afterward--such as powder, lotion, or perfume--unless you are told to do so by your health care provider. Only use lotions that are recommended by the manufacturer. Put on clean clothes or pajamas. Contact a health care provider if: Your skin gets irritated after scrubbing. You have questions about using your solution or cloth. You swallow any chlorhexidine. Call your local poison control center (1-(509)563-9410 in the [place] Get help right away if: Your eyes itch badly, or they become very red or swollen. Your skin itches badly and is red or swollen. Your hearing changes. You have trouble seeing. You have swelling or tingling in your mouth or throat. You have trouble breathing. These symptoms may represent a serious problem that is an emergency. Do not wait to see if the symptoms will go away. Get medical help right away. Call your local emergency services (911 in the [place] Do not drive yourself to the hospital. Summary Chlorhexidine gluconate (CHG) is a germ-killing (antiseptic) solution that is used to clean the skin. Cleaning your skin with CHG may help to lower your risk for infection. You may be given CHG to use for bathing. It may be in a bottle or in a prepackaged cloth to use on your skin. Carefully follow your health care provider's instructions and the instructions on the product label. Do not use CHG if you have a chlorhexidine allergy. Contact your health care provider if your skin gets irritated after scrubbing. This information is not intended to replace advice given to you by your health care provider. Make sure you discuss any questions you have with your health care provider. Document  Revised: 10/01/2020 Document Reviewed: 10/01/2020 Elsevier Patient Education  [place].

## 2021-04-30 ENCOUNTER — Ambulatory Visit (HOSPITAL_COMMUNITY): Payer: Medicare Other | Admitting: Physical Therapy

## 2021-05-01 ENCOUNTER — Encounter (HOSPITAL_COMMUNITY): Payer: Self-pay

## 2021-05-01 ENCOUNTER — Encounter (HOSPITAL_COMMUNITY)
Admission: RE | Admit: 2021-05-01 | Discharge: 2021-05-01 | Disposition: A | Discharge status: Home / Self Care | Payer: Medicare Other | Source: Ambulatory Visit | Attending: Orthopedic Surgery | Admitting: Orthopedic Surgery

## 2021-05-01 ENCOUNTER — Other Ambulatory Visit: Payer: Self-pay

## 2021-05-01 DIAGNOSIS — Z01818 Encounter for other preprocedural examination: Secondary | ICD-10-CM | POA: Diagnosis not present

## 2021-05-01 HISTORY — DX: Sleep apnea, unspecified: G47.30

## 2021-05-01 LAB — CBC WITH DIFFERENTIAL/PLATELET
Basophils Absolute: 0 10*3/uL (ref 0.0–0.1)
Basophils Relative: 0 %
Eosinophils Absolute: 0 10*3/uL (ref 0.0–0.5)
Eosinophils Relative: 0 %
HCT: 39 % (ref 36.0–46.0)
Hemoglobin: 12.7 g/dL (ref 12.0–15.0)
Lymphocytes Relative: 60 %
Lymphs Abs: 4.2 10*3/uL — ABNORMAL HIGH (ref 0.7–4.0)
MCH: 32.1 pg (ref 26.0–34.0)
MCHC: 32.6 g/dL (ref 30.0–36.0)
MCV: 98.5 fL (ref 80.0–100.0)
Monocytes Absolute: 0.7 10*3/uL (ref 0.1–1.0)
Monocytes Relative: 10 %
Neutro Abs: 2.1 10*3/uL (ref 1.7–7.7)
Neutrophils Relative %: 30 %
Platelets: 158 10*3/uL (ref 150–400)
RBC: 3.96 MIL/uL (ref 3.87–5.11)
RDW: 12.8 % (ref 11.5–15.5)
WBC: 7 10*3/uL (ref 4.0–10.5)
nRBC: 0 % (ref 0.0–0.2)

## 2021-05-01 LAB — BASIC METABOLIC PANEL
Anion gap: 5 (ref 5–15)
BUN: 18 mg/dL (ref 8–23)
CO2: 28 mmol/L (ref 22–32)
Calcium: 9.8 mg/dL (ref 8.9–10.3)
Chloride: 104 mmol/L (ref 98–111)
Creatinine, Ser: 0.56 mg/dL (ref 0.44–1.00)
GFR, Estimated: >60 mL/min (ref >60)
Glucose, Bld: 92 mg/dL (ref 70–99)
Potassium: 4.7 mmol/L (ref 3.5–5.1)
Sodium: 137 mmol/L (ref 135–145)

## 2021-05-02 ENCOUNTER — Encounter (HOSPITAL_COMMUNITY): Payer: Medicare Other | Admitting: Physical Therapy

## 2021-05-02 ENCOUNTER — Telehealth: Payer: Self-pay | Admitting: Orthopedic Surgery

## 2021-05-02 NOTE — Telephone Encounter (Signed)
Per voice message-received 10:10am - patient requests to reschedule surgery which is for tomorrow, 05/03/21, due to only transportation which is her sister, age 85; please call back to discuss.ph#[phone]

## 2021-05-02 NOTE — Telephone Encounter (Signed)
Left message for her to call back ? RS to Oct 14th

## 2021-05-02 NOTE — Telephone Encounter (Signed)
Noted, hospital aware, I will call her to [facility]

## 2021-05-03 DIAGNOSIS — I10 Essential (primary) hypertension: Secondary | ICD-10-CM | POA: Diagnosis not present

## 2021-05-03 DIAGNOSIS — F3342 Major depressive disorder, recurrent, in full remission: Secondary | ICD-10-CM | POA: Diagnosis not present

## 2021-05-03 DIAGNOSIS — E7849 Other hyperlipidemia: Secondary | ICD-10-CM | POA: Diagnosis not present

## 2021-05-03 DIAGNOSIS — M0609 Rheumatoid arthritis without rheumatoid factor, multiple sites: Secondary | ICD-10-CM | POA: Diagnosis not present

## 2021-05-07 ENCOUNTER — Encounter (HOSPITAL_COMMUNITY): Payer: Medicare Other | Admitting: Physical Therapy

## 2021-05-09 ENCOUNTER — Encounter (HOSPITAL_COMMUNITY): Payer: Medicare Other | Admitting: Physical Therapy

## 2021-05-13 NOTE — Telephone Encounter (Signed)
I have sent message to Hoyle Sauer she wants to Rs to 05/17/21 and Hoyle Sauer will call her.

## 2021-05-13 NOTE — Telephone Encounter (Signed)
Patient called back - thought she left message back that 05/17/21 is okay for her rescheduled surgery date. Please call her - ph#[phone].

## 2021-05-14 ENCOUNTER — Encounter (HOSPITAL_COMMUNITY)
Admission: RE | Admit: 2021-05-14 | Discharge: 2021-05-14 | Disposition: A | Discharge status: Home / Self Care | Payer: Medicare Other | Source: Ambulatory Visit | Attending: Orthopedic Surgery | Admitting: Orthopedic Surgery

## 2021-05-16 NOTE — H&P (Signed)
85 year old female would like to have left carpal tunnel release.  She had a nerve conduction study years ago that was positive  Based on the longevity of symptoms she understands that she may not get complete resolution of her toe numbness and tingling but hopes to get good pain relief especially at night to relieve the insomnia caused by the carpal tunnel symptoms       Chief Complaint  Patient presents with   Carpal Tunnel      Left wrist     85 year old female with rheumatoid arthritis and thyroid disease presents with 5-year history of numbness and tingling left upper extremity extreme night pain waking her up from sleep she has been braced without good relief she did have a nerve conduction study which showed carpal tunnel syndrome she is interested in carpal tunnel release       MEDICAL DECISION MAKING   A.      Encounter Diagnosis  Name Primary?   Carpal tunnel syndrome, left upper limb Yes      B. DATA ANALYSED:   IMAGING: Interpretation of images: No images  Orders: No new orders other than order left carpal tunnel release surgery  Outside records reviewed: Neurology notes from [facility] indicates the patient had a positive nerve conduction study    C. MANAGEMENT    Surgical release of the left carpal tunnel   No orders of the defined types were placed in this encounter.       BP (!) 161/88   Pulse 75   Ht 5\' 6"  (1.676 m)   Wt 132 lb (59.9 kg)   BMI 21.31 kg/m      General appearance: Well-developed well-nourished no gross deformities  Cardiovascular normal pulse and perfusion normal color without edema  Neurologically no sensation loss or deficits or pathologic reflexes   Psychological: Awake alert and oriented x3 mood and affect normal   Skin no lacerations or ulcerations no nodularity no palpable masses, no erythema or nodularity   Musculoskeletal: Her left hand shows multiple areas of bone spurs around the joint she does have full  range of motion normal grip no atrophy of the thenar or hyperthenar eminence she has a positive compression test at the carpal tunnel less than 15 seconds and a positive Phalen's test less than 10 seconds.  All of the fingers are showed numbness to her but she has intact sensation in the office.     ROS Chronic back pain no chest pain shortness of breath fever or chills       Past Medical History:  Diagnosis Date   Abdominal pain     Allergic rhinitis     Anxiety 10/03/2013   Arthralgia 03/31/2014   Bronchitis 09/09/2013   Cellulitis     Diverticulitis     Diverticulosis     Fatigue     GERD (gastroesophageal reflux disease)     Gout     Headache     Headache     Hyperchloremia     Hypercholesterolemia     Hypertension     Hypothyroidism     Lumbar strain     Major depressive disorder     Migraine     Osteoporosis     Rheumatoid arthritis ([place])             Past Surgical History:  Procedure Laterality Date   CARPAL TUNNEL RELEASE        25 years ago   CHOLECYSTECTOMY  20 yrs ago   COLONOSCOPY       COLONOSCOPY N/A 10/25/2014    Procedure: COLONOSCOPY;  Surgeon: Rogene Houston, MD;  [facility];  Service: Endoscopy;  Laterality: N/A;  [place] N/A 06/17/2019    Procedure: ESOPHAGEAL DILATION;  Surgeon: Rogene Houston, MD;  Location: [facility];  Service: Endoscopy;  Laterality: N/A;   ESOPHAGOGASTRODUODENOSCOPY N/A 10/25/2014    Procedure: ESOPHAGOGASTRODUODENOSCOPY (EGD);  Surgeon: Rogene Houston, MD;  [facility]: [facility];  Service: Endoscopy;  Laterality: N/A;   ESOPHAGOGASTRODUODENOSCOPY (EGD) WITH PROPOFOL N/A 06/17/2019    Procedure: ESOPHAGOGASTRODUODENOSCOPY (EGD) WITH PROPOFOL;  Surgeon: Rogene Houston, MD;  [facility]: [facility];  Service: Endoscopy;  Laterality: N/A;  130pm   HEMORROIDECTOMY        2005   POLYPECTOMY       TOTAL ABDOMINAL HYSTERECTOMY        Age 22   UPPER GASTROINTESTINAL ENDOSCOPY                Family History  Problem Relation Age of Onset   Alzheimer's disease Father     Alzheimer's disease Sister     Macular degeneration Sister     Healthy Brother     Arthritis Sister     Arthritis Sister     COPD Sister     Heart disease Mother     Seizures Sister     Seizures Other          nephew   Liver cancer Other     Alcohol abuse Sister      Social History         Tobacco Use   Smoking status: Former      Packs/day: 0.50      Types: Cigarettes      Quit date: 10/2016      Years since quitting: 4.5   Smokeless tobacco: Never   Tobacco comments:      Quit 16 days ago after smoking for off and on for years  Vaping Use   Vaping Use: Never used  Substance Use Topics   Alcohol use: Not Currently      Alcohol/week: 0.0 standard drinks      Comment: 4x per week, 1/2 glass of wine   Drug use: No           Allergies  Allergen Reactions   Dexmedetomidine     Diazepam     Ibuprofen Other (See Comments)      Heartburn "intolerance"   Iodine     Penicillin G        Active Medications      Current Meds  Medication Sig   B Complex-C (SUPER B COMPLEX PO) Take by mouth.   Cholecalciferol (VITAMIN D3 PO) Take 2,000 Units by mouth.   famotidine (PEPCID) 40 MG tablet TAKE 1 TABLET BY MOUTH AT BEDTIME   levothyroxine (SYNTHROID, LEVOTHROID) 100 MCG tablet Take 100 mcg by mouth daily before breakfast.   LUTEIN PO Take by mouth in the morning and at bedtime.   Multiple Vitamins-Minerals (PRESERVISION/LUTEIN PO) Take 1 tablet by mouth daily.    Probiotic Product (PROBIOTIC DAILY PO) Take 1 tablet by mouth daily. Sometimes she will take twice a day.    pyridOXINE (VITAMIN B-6) 100 MG tablet Take 100 mg by mouth daily.   traMADol (ULTRAM) 50 MG tablet Take 1 tablet (50 mg total) by mouth every 6 (six) hours as needed. (Patient  taking differently: Take 50 mg by mouth every 6 (six) hours as needed for moderate pain.)   triamcinolone cream (KENALOG) 0.1 % Apply 1  application topically 2 (two) times daily.   TURMERIC PO Take 1,000 mg by mouth daily.   verapamil (CALAN-SR) 240 MG CR tablet Take 240 mg by mouth daily.   [DISCONTINUED] hydrochlorothiazide (HYDRODIURIL) 12.5 MG tablet Take 12.5 mg by mouth daily.   [DISCONTINUED] pimecrolimus (ELIDEL) 1 % cream Apply 1 application topically daily as needed (itching ear canal).                Arther Abbott, MD

## 2021-05-17 ENCOUNTER — Ambulatory Visit (HOSPITAL_COMMUNITY): Payer: Medicare Other | Admitting: Anesthesiology

## 2021-05-17 ENCOUNTER — Ambulatory Visit (HOSPITAL_COMMUNITY)
Admission: RE | Admit: 2021-05-17 | Discharge: 2021-05-17 | Disposition: A | Discharge status: Home / Self Care | Payer: Medicare Other | Attending: Orthopedic Surgery | Admitting: Orthopedic Surgery

## 2021-05-17 ENCOUNTER — Encounter (HOSPITAL_COMMUNITY)
Admission: RE | Disposition: A | Discharge status: Home / Self Care | Payer: Self-pay | Source: Home / Self Care | Attending: Orthopedic Surgery

## 2021-05-17 ENCOUNTER — Encounter (HOSPITAL_COMMUNITY): Payer: Self-pay | Admitting: Orthopedic Surgery

## 2021-05-17 DIAGNOSIS — Z79899 Other long term (current) drug therapy: Secondary | ICD-10-CM | POA: Diagnosis not present

## 2021-05-17 DIAGNOSIS — Z888 Allergy status to other drugs, medicaments and biological substances status: Secondary | ICD-10-CM | POA: Insufficient documentation

## 2021-05-17 DIAGNOSIS — Z886 Allergy status to analgesic agent status: Secondary | ICD-10-CM | POA: Diagnosis not present

## 2021-05-17 DIAGNOSIS — Z880 Allergy status to penicillin: Secondary | ICD-10-CM | POA: Insufficient documentation

## 2021-05-17 DIAGNOSIS — E039 Hypothyroidism, unspecified: Secondary | ICD-10-CM | POA: Diagnosis not present

## 2021-05-17 DIAGNOSIS — Z79890 Hormone replacement therapy (postmenopausal): Secondary | ICD-10-CM | POA: Diagnosis not present

## 2021-05-17 DIAGNOSIS — Z884 Allergy status to anesthetic agent status: Secondary | ICD-10-CM | POA: Diagnosis not present

## 2021-05-17 DIAGNOSIS — G5602 Carpal tunnel syndrome, left upper limb: Secondary | ICD-10-CM

## 2021-05-17 DIAGNOSIS — Z87891 Personal history of nicotine dependence: Secondary | ICD-10-CM | POA: Diagnosis not present

## 2021-05-17 DIAGNOSIS — M069 Rheumatoid arthritis, unspecified: Secondary | ICD-10-CM | POA: Insufficient documentation

## 2021-05-17 HISTORY — PX: CARPAL TUNNEL RELEASE: SHX101

## 2021-05-17 SURGERY — CARPAL TUNNEL RELEASE
Anesthesia: General | Site: Wrist | Laterality: Left

## 2021-05-17 MED ORDER — LACTATED RINGERS IV SOLN
INTRAVENOUS | Status: DC
  Administered 2021-05-17: 1000 mL via INTRAVENOUS

## 2021-05-17 MED ORDER — ORAL CARE MOUTH RINSE: 15.0000 mL | Freq: Once | OROMUCOSAL | Status: AC

## 2021-05-17 MED ORDER — LIDOCAINE 2% (20 MG/ML) 5 ML SYRINGE
INTRAMUSCULAR | Status: DC | PRN
  Administered 2021-05-17: 50 mg via INTRAVENOUS

## 2021-05-17 MED ORDER — BUPIVACAINE HCL (PF) 0.5 % IJ SOLN
INTRAMUSCULAR | Status: AC
  Filled 2021-05-17: qty 30

## 2021-05-17 MED ORDER — LIDOCAINE HCL (PF) 2 % IJ SOLN
INTRAMUSCULAR | Status: AC
  Filled 2021-05-17: qty 5

## 2021-05-17 MED ORDER — TRAMADOL-ACETAMINOPHEN 37.5-325 MG PO TABS: 1.0000 | ORAL_TABLET | ORAL | 0 refills | Status: DC | PRN

## 2021-05-17 MED ORDER — DEXAMETHASONE SODIUM PHOSPHATE 10 MG/ML IJ SOLN
INTRAMUSCULAR | Status: AC
  Filled 2021-05-17: qty 1

## 2021-05-17 MED ORDER — 0.9 % SODIUM CHLORIDE (POUR BTL) OPTIME
TOPICAL | Status: DC | PRN
  Administered 2021-05-17: 1000 mL

## 2021-05-17 MED ORDER — ONDANSETRON HCL 4 MG/2ML IJ SOLN
INTRAMUSCULAR | Status: DC | PRN
  Administered 2021-05-17: 4 mg via INTRAVENOUS

## 2021-05-17 MED ORDER — ONDANSETRON HCL 4 MG/2ML IJ SOLN
INTRAMUSCULAR | Status: AC
  Filled 2021-05-17: qty 2

## 2021-05-17 MED ORDER — DEXAMETHASONE SODIUM PHOSPHATE 4 MG/ML IJ SOLN
INTRAMUSCULAR | Status: DC | PRN
Start: 2021-05-17 — End: 2021-05-17
  Administered 2021-05-17: 4 mg via INTRAVENOUS

## 2021-05-17 MED ORDER — VANCOMYCIN HCL IN DEXTROSE 1-5 GM/200ML-% IV SOLN
1000.0000 mg | INTRAVENOUS | Status: AC
  Administered 2021-05-17: 1000 mg via INTRAVENOUS
  Filled 2021-05-17: qty 200

## 2021-05-17 MED ORDER — PROPOFOL 10 MG/ML IV BOLUS
INTRAVENOUS | Status: AC
  Filled 2021-05-17: qty 20

## 2021-05-17 MED ORDER — PROPOFOL 10 MG/ML IV BOLUS
INTRAVENOUS | Status: DC | PRN
  Administered 2021-05-17 (×2): 10 mg via INTRAVENOUS
  Administered 2021-05-17: 20 mg via INTRAVENOUS
  Administered 2021-05-17: 30 mg via INTRAVENOUS

## 2021-05-17 MED ORDER — BUPIVACAINE HCL (PF) 0.5 % IJ SOLN
INTRAMUSCULAR | Status: DC | PRN
  Administered 2021-05-17: 10 mL

## 2021-05-17 MED ORDER — FENTANYL CITRATE (PF) 100 MCG/2ML IJ SOLN
INTRAMUSCULAR | Status: AC
  Filled 2021-05-17: qty 2

## 2021-05-17 MED ORDER — CHLORHEXIDINE GLUCONATE 0.12 % MT SOLN
15.0000 mL | Freq: Once | OROMUCOSAL | Status: AC
  Administered 2021-05-17: 15 mL via OROMUCOSAL

## 2021-05-17 MED ORDER — PROPOFOL 500 MG/50ML IV EMUL
INTRAVENOUS | Status: DC | PRN
  Administered 2021-05-17: 20 ug/kg/min via INTRAVENOUS

## 2021-05-17 MED ORDER — FENTANYL CITRATE (PF) 100 MCG/2ML IJ SOLN
INTRAMUSCULAR | Status: DC | PRN
  Administered 2021-05-17 (×2): 25 ug via INTRAVENOUS

## 2021-05-17 SURGICAL SUPPLY — 41 items
APL PRP STRL LF DISP 70% ISPRP (MISCELLANEOUS) ×1
BANDAGE ACE 3X5.8 VEL STRL LF (GAUZE/BANDAGES/DRESSINGS) ×1 IMPLANT
BANDAGE ESMARK 4X12 BL STRL LF (DISPOSABLE) ×1 IMPLANT
BLADE SURG 15 STRL LF DISP TIS (BLADE) ×1 IMPLANT
BLADE SURG 15 STRL SS (BLADE) ×2
BNDG CMPR 12X4 ELC STRL LF (DISPOSABLE) ×1
BNDG CMPR STD VLCR NS LF 5.8X3 (GAUZE/BANDAGES/DRESSINGS)
BNDG COHESIVE 4X5 TAN STRL (GAUZE/BANDAGES/DRESSINGS) ×2 IMPLANT
BNDG ELASTIC 3X5.8 VLCR NS LF (GAUZE/BANDAGES/DRESSINGS) ×1 IMPLANT
BNDG ESMARK 4X12 BLUE STRL LF (DISPOSABLE) ×2
BNDG GAUZE ELAST 4 BULKY (GAUZE/BANDAGES/DRESSINGS) ×2 IMPLANT
CHLORAPREP W/TINT 26 (MISCELLANEOUS) ×2 IMPLANT
CLOTH BEACON ORANGE TIMEOUT ST (SAFETY) ×2 IMPLANT
COVER LIGHT HANDLE STERIS (MISCELLANEOUS) ×4 IMPLANT
CUFF TOURN SGL QUICK 18X4 (TOURNIQUET CUFF) ×2 IMPLANT
DECANTER SPIKE VIAL GLASS SM (MISCELLANEOUS) ×2 IMPLANT
DRAPE HALF SHEET 40X57 (DRAPES) ×2 IMPLANT
DRSG XEROFORM 1X8 (GAUZE/BANDAGES/DRESSINGS) ×1 IMPLANT
ELECT NDL TIP 2.8 STRL (NEEDLE) ×1 IMPLANT
ELECT NEEDLE TIP 2.8 STRL (NEEDLE) ×2 IMPLANT
ELECT REM PT RETURN 9FT ADLT (ELECTROSURGICAL) ×2
ELECTRODE REM PT RTRN 9FT ADLT (ELECTROSURGICAL) ×1 IMPLANT
GAUZE SPONGE 4X4 12PLY STRL (GAUZE/BANDAGES/DRESSINGS) ×2 IMPLANT
GLOVE SS N UNI LF 8.5 STRL (GLOVE) ×2 IMPLANT
GLOVE SURG POLYISO LF SZ8 (GLOVE) ×2 IMPLANT
GLOVE SURG UNDER POLY LF SZ7 (GLOVE) ×4 IMPLANT
GOWN STRL REUS W/TWL LRG LVL3 (GOWN DISPOSABLE) ×2 IMPLANT
GOWN STRL REUS W/TWL XL LVL3 (GOWN DISPOSABLE) ×2 IMPLANT
KIT TURNOVER KIT A (KITS) ×2 IMPLANT
MANIFOLD NEPTUNE II (INSTRUMENTS) ×2 IMPLANT
NDL HYPO 21X1.5 SAFETY (NEEDLE) ×1 IMPLANT
NEEDLE HYPO 21X1.5 SAFETY (NEEDLE) ×2 IMPLANT
NS IRRIG 1000ML POUR BTL (IV SOLUTION) ×2 IMPLANT
PACK BASIC LIMB (CUSTOM PROCEDURE TRAY) ×2 IMPLANT
PAD ARMBOARD 7.5X6 YLW CONV (MISCELLANEOUS) ×2 IMPLANT
POSITIONER HAND ALUMI XLG (MISCELLANEOUS) ×2 IMPLANT
SET BASIN LINEN APH (SET/KITS/TRAYS/PACK) ×2 IMPLANT
SPONGE GAUZE 4X4 12PLY STER LF (GAUZE/BANDAGES/DRESSINGS) ×1 IMPLANT
SUT ETHILON 3 0 FSL (SUTURE) ×2 IMPLANT
SYR BULB IRRIG 60ML STRL (SYRINGE) ×2 IMPLANT
SYR CONTROL 10ML LL (SYRINGE) ×2 IMPLANT

## 2021-05-17 NOTE — Anesthesia Procedure Notes (Addendum)
Date/Time: 05/17/2021 1:01 PM Performed by: Orlie Dakin, CRNA Pre-anesthesia Checklist: Patient identified, Emergency Drugs available, Suction available and Patient being monitored Patient Re-evaluated:Patient Re-evaluated prior to induction Oxygen Delivery Method: Non-rebreather mask Induction Type: IV induction Placement Confirmation: positive ETCO2

## 2021-05-17 NOTE — Brief Op Note (Signed)
05/17/2021  Date 05/17/2021   PRE-OPERATIVE DIAGNOSIS:  LEFT CARPAL TUNNEL SYNDROME  POST-OPERATIVE DIAGNOSIS:  LEFT CARPAL TUNNEL SYNDROME  PROCEDURE:  Procedure(s): LEFT CARPAL TUNNEL RELEASE (Right)   FINDINGS: The color of the median nerve was normal there was some compression and hourglass configuration the carpal tunnel contents were otherwise normal  SURGEON:  Surgeon(s) and Role:    Carole Civil, MD - Primary  PHYSICIAN ASSISTANT:   ASSISTANTS: none   ANESTHESIA:   regional  BLOOD ADMINISTERED:none  DRAINS: none   LOCAL MEDICATIONS USED:  MARCAINE     SPECIMEN:  No Specimen  DISPOSITION OF SPECIMEN:  N/A  COUNTS:  YES  TOURNIQUET: 250 mmHg  DICTATION: .Dragon Dictation   Carpal tunnel release left wrist   Surgeon Aline Brochure  Anesthesia MAC  Operative findings compression of the LEFT median nerve   Indications failure of conservative treatment to relieve pain and paresthesias and numbness and tingling of the left hand  The patient was identified in the preop area we confirm the surgical site marked as left wrist chart update completed. Patient taken to surgery.  Antibiotic vancomycin 1 g after establishing a successful IV sedation anesthesia the   left arm was prepped with ChloraPrep  Timeout executed completed and confirmed site.  Left wrist the limb was exsanguinated with Esmarch the tourniquet was elevated and 7 cc of plain Marcaine was injected into the surgical site  A straight incision was made over the left carpal tunnel in line with the radial border of the ring finger. Blunt dissection was carried out to find the distal aspect of the carpal tunnel. A blunted instrument was passed beneath the carpal tunnel. Sharp incision was then used to release the transverse carpal ligament. The contents of the carpal tunnel were inspected. The median nerve was normal color hourglass appearance  The wound was irrigated and then closed with 3-0 nylon  suture. We injected 10 mL of plain Marcaine on the radial side of the incision  A sterile bandage was applied and the tourniquet was released the color of the hand and capillary refill were normal  The patient was taken to the recovery room in stable condition  PLAN OF CARE: Discharge to home after PACU  PATIENT DISPOSITION:  PACU - hemodynamically stable.   Delay start of Pharmacological VTE agent (>24hrs) due to surgical blood loss or risk of bleeding: not applicable

## 2021-05-17 NOTE — Anesthesia Preprocedure Evaluation (Signed)
Anesthesia Evaluation  Patient identified by MRN, date of birth, ID band Patient awake    Reviewed: Allergy & Precautions, NPO status , Patient's Chart, lab work & pertinent test results  Airway Mallampati: II  TM Distance: >3 FB Neck ROM: Full    Dental  (+) Dental Advisory Given, Missing   Pulmonary sleep apnea , former smoker,    Pulmonary exam normal breath sounds clear to auscultation       Cardiovascular Exercise Tolerance: Good hypertension, Pt. on medications  Rhythm:Regular Rate:Normal + Systolic murmurs    Neuro/Psych  Headaches, PSYCHIATRIC DISORDERS Anxiety Depression  Neuromuscular disease    GI/Hepatic Neg liver ROS, GERD  Medicated,  Endo/Other  Hypothyroidism   Renal/GU negative Renal ROS     Musculoskeletal  (+) Arthritis , Osteoarthritis and Rheumatoid disorders,    Abdominal   Peds  Hematology negative hematology ROS (+)   Anesthesia Other Findings   Reproductive/Obstetrics negative OB ROS                           Anesthesia Physical Anesthesia Plan  ASA: 2  Anesthesia Plan: General   Post-op Pain Management:    Induction: Intravenous  PONV Risk Score and Plan: TIVA and Ondansetron  Airway Management Planned: Nasal Cannula and Natural Airway  Additional Equipment:   Intra-op Plan:   Post-operative Plan:   Informed Consent: I have reviewed the patients History and Physical, chart, labs and discussed the procedure including the risks, benefits and alternatives for the proposed anesthesia with the patient or authorized representative who has indicated his/her understanding and acceptance.     Dental advisory given  Plan Discussed with: CRNA and Surgeon  Anesthesia Plan Comments:        Anesthesia Quick Evaluation

## 2021-05-17 NOTE — Op Note (Signed)
05/17/2021  Date 05/17/2021   PRE-OPERATIVE DIAGNOSIS:  LEFT CARPAL TUNNEL SYNDROME  POST-OPERATIVE DIAGNOSIS:  LEFT CARPAL TUNNEL SYNDROME  PROCEDURE:  Procedure(s): LEFT CARPAL TUNNEL RELEASE (Right)   FINDINGS: The color of the median nerve was normal there was some compression and hourglass configuration the carpal tunnel contents were otherwise normal  SURGEON:  Surgeon(s) and Role:    Carole Civil, MD - Primary  PHYSICIAN ASSISTANT:   ASSISTANTS: none   ANESTHESIA:   regional  BLOOD ADMINISTERED:none  DRAINS: none   LOCAL MEDICATIONS USED:  MARCAINE     SPECIMEN:  No Specimen  DISPOSITION OF SPECIMEN:  N/A  COUNTS:  YES  TOURNIQUET: 250 mmHg  DICTATION: .Dragon Dictation   Carpal tunnel release left wrist   Surgeon Aline Brochure  Anesthesia MAC  Operative findings compression of the LEFT median nerve   Indications failure of conservative treatment to relieve pain and paresthesias and numbness and tingling of the left hand  The patient was identified in the preop area we confirm the surgical site marked as left wrist chart update completed. Patient taken to surgery.  Antibiotic vancomycin 1 g after establishing a successful IV sedation anesthesia the   left arm was prepped with ChloraPrep  Timeout executed completed and confirmed site.  Left wrist the limb was exsanguinated with Esmarch the tourniquet was elevated and 7 cc of plain Marcaine was injected into the surgical site  A straight incision was made over the left carpal tunnel in line with the radial border of the ring finger. Blunt dissection was carried out to find the distal aspect of the carpal tunnel. A blunted instrument was passed beneath the carpal tunnel. Sharp incision was then used to release the transverse carpal ligament. The contents of the carpal tunnel were inspected. The median nerve was normal color hourglass appearance  The wound was irrigated and then closed with 3-0 nylon  suture. We injected 10 mL of plain Marcaine on the radial side of the incision  A sterile bandage was applied and the tourniquet was released the color of the hand and capillary refill were normal  The patient was taken to the recovery room in stable condition  PLAN OF CARE: Discharge to home after PACU  PATIENT DISPOSITION:  PACU - hemodynamically stable.   Delay start of Pharmacological VTE agent (>24hrs) due to surgical blood loss or risk of bleeding: not applicable

## 2021-05-17 NOTE — Anesthesia Postprocedure Evaluation (Signed)
Anesthesia Post Note  Patient: Megan Daniel  Procedure(s) Performed: CARPAL TUNNEL RELEASE (Left: Wrist)  Patient location during evaluation: PACU Anesthesia Type: General Level of consciousness: awake and alert and oriented Pain management: pain level controlled Vital Signs Assessment: post-procedure vital signs reviewed and stable Respiratory status: spontaneous breathing, nonlabored ventilation and respiratory function stable Cardiovascular status: blood pressure returned to baseline and stable Postop Assessment: no apparent nausea or vomiting Anesthetic complications: no   No notable events documented.   Last Vitals:  Vitals:   05/17/21 1331 05/17/21 1401  BP: 123/63 (!) 172/68  Pulse: (!) 56 (!) 56  Resp: 16 18  Temp: 36.7 C 36.9 C  SpO2: 99% 100%    Last Pain:  Vitals:   05/17/21 1401  TempSrc: Oral  PainSc: 0-No pain                 Kaylean Tupou C Gage Treiber

## 2021-05-17 NOTE — Transfer of Care (Signed)
Immediate Anesthesia Transfer of Care Note  Patient: Megan Daniel  Procedure(s) Performed: CARPAL TUNNEL RELEASE (Left: Wrist)  Patient Location: PACU  Anesthesia Type:General  Level of Consciousness: awake, alert  and oriented  Airway & Oxygen Therapy: Patient Spontanous Breathing  Post-op Assessment: Report given to RN, Post -op Vital signs reviewed and stable and Patient moving all extremities X 4  Post vital signs: Reviewed and stable  Last Vitals:  Vitals Value Taken Time  BP    Temp    Pulse    Resp    SpO2      Last Pain:  Vitals:   05/17/21 1109  TempSrc: Oral  PainSc: 0-No pain      Patients Stated Pain Goal: 7 ([id number])  Complications: No notable events documented.

## 2021-05-17 NOTE — Interval H&P Note (Signed)
History and Physical Interval Note:  05/17/2021 20:10 AM  Megan Daniel  has presented today for surgery, with the diagnosis of left carpal tunnel syndrome.  The various methods of treatment have been discussed with the patient and family. After consideration of risks, benefits and other options for treatment, the patient has consented to  Procedure(s) with comments: CARPAL TUNNEL RELEASE (Left) - pt knows to arrive at 10:45 as a surgical intervention.  The patient's history has been reviewed, patient examined, no change in status, stable for surgery.  I have reviewed the patient's chart and labs.  Questions were answered to the patient's satisfaction.     Arther Abbott

## 2021-05-20 ENCOUNTER — Telehealth: Payer: Self-pay | Admitting: Orthopedic Surgery

## 2021-05-20 ENCOUNTER — Encounter (HOSPITAL_COMMUNITY): Payer: Self-pay | Admitting: Orthopedic Surgery

## 2021-05-20 NOTE — Telephone Encounter (Signed)
I called the patient to schedule her post op for Wednesday and she said she had the worse night last night. She was in severe pain and she wants to know if the weather has anything to do with it?    She didn't want to take the pain medicine, but last night it was so bad she took the pain medicine at 10:00 pm  and again at 4:00 am and it helped tremendously.  She is going to take the pain medicine every 6 hours now.   She wants to know if she needs to keep it elevated till Wednesday.  Please call her back and advise.

## 2021-05-20 NOTE — Telephone Encounter (Signed)
Patient called back and left voicemail at 12:40 she had to step out and she will be inside for the rest of the day till Amy calls her back she said.

## 2021-05-20 NOTE — Telephone Encounter (Signed)
Called her to advise ice elevate TAKE PAIN MEDS IF IT HURTS Left message for her to call back.

## 2021-05-21 NOTE — Telephone Encounter (Signed)
Called advised ice elevate rest and use pain meds when she hurts  She is better today  To Dr Waynette Buttery

## 2021-05-22 ENCOUNTER — Ambulatory Visit (INDEPENDENT_AMBULATORY_CARE_PROVIDER_SITE_OTHER): Payer: Medicare Other | Admitting: Orthopedic Surgery

## 2021-05-22 ENCOUNTER — Encounter: Payer: Self-pay | Admitting: Orthopedic Surgery

## 2021-05-22 ENCOUNTER — Other Ambulatory Visit: Payer: Self-pay

## 2021-05-22 DIAGNOSIS — Z9889 Other specified postprocedural states: Secondary | ICD-10-CM

## 2021-05-22 DIAGNOSIS — G5602 Carpal tunnel syndrome, left upper limb: Secondary | ICD-10-CM

## 2021-05-22 NOTE — Progress Notes (Signed)
Chief Complaint  Patient presents with   Post-op Follow-up    05/17/21 left CTR    Encounter Diagnoses  Name Primary?   Carpal tunnel syndrome, left upper limb Yes   S/P carpal tunnel release, left.  May 17, 2021     PRE-OPERATIVE DIAGNOSIS:  LEFT CARPAL TUNNEL SYNDROME   POST-OPERATIVE DIAGNOSIS:  LEFT CARPAL TUNNEL SYNDROME   PROCEDURE:  Procedure(s): LEFT CARPAL TUNNEL RELEASE (Right)  Megan Daniel is doing well she had 1 bad day.  Her left carpal tunnel release seem to give her good relief of her numbness and tingling with complete resolution  Sutures out on the 28th, then follow-up in 4 weeks after that

## 2021-05-30 ENCOUNTER — Ambulatory Visit (INDEPENDENT_AMBULATORY_CARE_PROVIDER_SITE_OTHER): Payer: Medicare Other | Admitting: Orthopedic Surgery

## 2021-05-30 DIAGNOSIS — Z9889 Other specified postprocedural states: Secondary | ICD-10-CM

## 2021-05-30 NOTE — Progress Notes (Signed)
Chief Complaint  Patient presents with   Routine Post Op    Lt CTR DOS 05/17/21    Encounter Diagnosis  Name Primary?   S/P carpal tunnel release, left.  May 17, 2021 Yes    PROCEDURE:  Procedure(s): LEFT CARPAL TUNNEL RELEASE (Right)  POD 13 suture removal   Symptoms: none   The hand looks good the fingers are wrinkling the sensation is normal and her range of motion is normal  F/u prn

## 2021-06-14 ENCOUNTER — Telehealth: Payer: Self-pay

## 2021-06-14 NOTE — Telephone Encounter (Signed)
Patient called stating her hand has began hurting again and is swollen along her incision site. She stated the incision healed beautifully but it is swollen and painful  S/p CTR LT 05/17/21  patient scheduled to come in on 05/17/21

## 2021-06-14 NOTE — Telephone Encounter (Signed)
Spoke with patient and advised her of providers instructions. Patient stated she can't take Ibuprofen but will take ES Tylenol until her appt. Advised her to keep appt for follow up

## 2021-06-17 ENCOUNTER — Other Ambulatory Visit: Payer: Self-pay

## 2021-06-17 ENCOUNTER — Ambulatory Visit (INDEPENDENT_AMBULATORY_CARE_PROVIDER_SITE_OTHER): Payer: Medicare Other | Admitting: Orthopedic Surgery

## 2021-06-17 ENCOUNTER — Telehealth: Payer: Self-pay | Admitting: Orthopedic Surgery

## 2021-06-17 DIAGNOSIS — Z9889 Other specified postprocedural states: Secondary | ICD-10-CM

## 2021-06-17 NOTE — Progress Notes (Signed)
Chief Complaint  Patient presents with   Hand Pain    L/DOS 05/17/21  Evonnie Pat hurting some, using ice and elevation, its still swollen some.   Dollene called for pain and swelling she did ice the wrist and hand the swelling went down she is on meloxicam 7.5 mg daily  Her exam is benign the incision healed there is no tightness or swelling there is no numbness or tingling she has full range of motion  I told her to continue with the ice as needed elevate as needed follow-up in 4 weeks

## 2021-06-17 NOTE — Telephone Encounter (Signed)
Patient called at 10:05 wanting to know if her appt was still at 10:20 I advised her it is at 10:10. She said oh my and said she is on her way and hung up.  I tried to call her back because she is not going to make it from [place] in the 15 minute window.  She did not answer her home number or cell phone number.    She will be here as soon as she can I guess

## 2021-06-17 NOTE — Telephone Encounter (Signed)
Patient called back again and now she is coming this afternoon at 2:30

## 2021-07-13 DIAGNOSIS — Z20828 Contact with and (suspected) exposure to other viral communicable diseases: Secondary | ICD-10-CM | POA: Diagnosis not present

## 2021-07-15 ENCOUNTER — Encounter: Payer: Medicare Other | Admitting: Orthopedic Surgery

## 2021-07-16 DIAGNOSIS — Z20828 Contact with and (suspected) exposure to other viral communicable diseases: Secondary | ICD-10-CM | POA: Diagnosis not present

## 2021-07-16 DIAGNOSIS — L509 Urticaria, unspecified: Secondary | ICD-10-CM | POA: Diagnosis not present

## 2021-07-30 DIAGNOSIS — Z20828 Contact with and (suspected) exposure to other viral communicable diseases: Secondary | ICD-10-CM | POA: Diagnosis not present

## 2021-08-02 DIAGNOSIS — I10 Essential (primary) hypertension: Secondary | ICD-10-CM | POA: Diagnosis not present

## 2021-08-02 DIAGNOSIS — M0609 Rheumatoid arthritis without rheumatoid factor, multiple sites: Secondary | ICD-10-CM | POA: Diagnosis not present

## 2021-08-02 DIAGNOSIS — F3342 Major depressive disorder, recurrent, in full remission: Secondary | ICD-10-CM | POA: Diagnosis not present

## 2021-08-02 DIAGNOSIS — E7849 Other hyperlipidemia: Secondary | ICD-10-CM | POA: Diagnosis not present

## 2021-08-29 DIAGNOSIS — R252 Cramp and spasm: Secondary | ICD-10-CM | POA: Diagnosis not present

## 2021-08-29 DIAGNOSIS — E559 Vitamin D deficiency, unspecified: Secondary | ICD-10-CM | POA: Diagnosis not present

## 2021-08-29 DIAGNOSIS — Z6824 Body mass index (BMI) 24.0-24.9, adult: Secondary | ICD-10-CM | POA: Diagnosis not present

## 2021-08-29 DIAGNOSIS — I10 Essential (primary) hypertension: Secondary | ICD-10-CM | POA: Diagnosis not present

## 2021-09-10 DIAGNOSIS — R5382 Chronic fatigue, unspecified: Secondary | ICD-10-CM | POA: Diagnosis not present

## 2021-09-10 DIAGNOSIS — M069 Rheumatoid arthritis, unspecified: Secondary | ICD-10-CM | POA: Diagnosis not present

## 2021-09-10 DIAGNOSIS — E782 Mixed hyperlipidemia: Secondary | ICD-10-CM | POA: Diagnosis not present

## 2021-09-10 DIAGNOSIS — E559 Vitamin D deficiency, unspecified: Secondary | ICD-10-CM | POA: Diagnosis not present

## 2021-09-10 DIAGNOSIS — K2100 Gastro-esophageal reflux disease with esophagitis, without bleeding: Secondary | ICD-10-CM | POA: Diagnosis not present

## 2021-09-10 DIAGNOSIS — E7849 Other hyperlipidemia: Secondary | ICD-10-CM | POA: Diagnosis not present

## 2021-09-10 DIAGNOSIS — E039 Hypothyroidism, unspecified: Secondary | ICD-10-CM | POA: Diagnosis not present

## 2021-09-10 DIAGNOSIS — M109 Gout, unspecified: Secondary | ICD-10-CM | POA: Diagnosis not present

## 2021-09-10 DIAGNOSIS — I10 Essential (primary) hypertension: Secondary | ICD-10-CM | POA: Diagnosis not present

## 2021-09-12 DIAGNOSIS — E559 Vitamin D deficiency, unspecified: Secondary | ICD-10-CM | POA: Diagnosis not present

## 2021-09-12 DIAGNOSIS — M069 Rheumatoid arthritis, unspecified: Secondary | ICD-10-CM | POA: Diagnosis not present

## 2021-09-12 DIAGNOSIS — M5430 Sciatica, unspecified side: Secondary | ICD-10-CM | POA: Diagnosis not present

## 2021-09-12 DIAGNOSIS — M810 Age-related osteoporosis without current pathological fracture: Secondary | ICD-10-CM | POA: Diagnosis not present

## 2021-09-12 DIAGNOSIS — G43909 Migraine, unspecified, not intractable, without status migrainosus: Secondary | ICD-10-CM | POA: Diagnosis not present

## 2021-09-12 DIAGNOSIS — E7849 Other hyperlipidemia: Secondary | ICD-10-CM | POA: Diagnosis not present

## 2021-09-12 DIAGNOSIS — I10 Essential (primary) hypertension: Secondary | ICD-10-CM | POA: Diagnosis not present

## 2021-09-12 DIAGNOSIS — E039 Hypothyroidism, unspecified: Secondary | ICD-10-CM | POA: Diagnosis not present

## 2021-11-06 DIAGNOSIS — Z20828 Contact with and (suspected) exposure to other viral communicable diseases: Secondary | ICD-10-CM | POA: Diagnosis not present

## 2021-12-13 NOTE — Progress Notes (Signed)
Office Visit Note  Patient: Megan Daniel             Date of Birth: 26-Aug-1935           MRN: [id number]             PCP: Manon Hilding, MD Referring: Manon Hilding, MD Visit Date: 12/20/2021 Occupation: _0 @  Subjective:  No chief complaint on file.   History of Present Illness: Megan Daniel is a 86 y.o. female seen in consultation per request of Dr. Quintin Alto for evaluation of possible rheumatoid arthritis.  Patient states that she has had arthritis in her hands for over 10 years.  She used to be a patient of Dr. Charlestine Night who used to give her prednisone on a as needed basis.  Since Dr. Charlestine Night retired she has been on Mobic by Dr. Quintin Alto.  Patient had been under care of Dr. Aline Brochure for carpal tunnel syndrome and underwent left carpal tunnel release last year.  She was also having some lower back discomfort in August 2022 for which she had x-rays of her lumbar spine which were consistent with degenerative disc disease and scoliosis.  She was referred to Dr. Tia Masker in March 2022 who evaluated the patient but did not come up with a diagnosis per patient.  She has been seeing Dr. Jaynee Eagles for small fiber neuropathy.  Dr. Jaynee Eagles did recent labs and her rheumatoid factor was positive.  Besides her hands none of the other joints are painful.  She has some discomfort in her feet which could be coming from neuropathy.  She continues to have some lower back pain.  There is family history of osteoarthritis in her mother.  She is gravida 0 para 0.  She used to work as a Marine scientist.  She enjoys gardening, painting and walking now.  Activities of Daily Living:  Patient reports morning stiffness for 0 minutes.   Patient Denies nocturnal pain.  Difficulty dressing/grooming: Denies Difficulty climbing stairs: Denies Difficulty getting out of chair: Denies Difficulty using hands for taps, buttons, cutlery, and/or writing: Reports  Review of Systems  Constitutional:  Positive for fatigue.  HENT:   Negative for mouth sores, mouth dryness and nose dryness.   Eyes:  Negative for pain, itching and dryness.  Respiratory:  Negative for shortness of breath and difficulty breathing.   Cardiovascular:  Negative for chest pain and palpitations.  Gastrointestinal:  Negative for blood in stool, constipation and diarrhea.  Endocrine: Negative for increased urination.  Genitourinary:  Positive for involuntary urination. Negative for difficulty urinating.  Musculoskeletal:  Positive for joint pain, joint pain, joint swelling and morning stiffness. Negative for myalgias, muscle tenderness and myalgias.  Skin:  Negative for color change, rash, redness and sensitivity to sunlight.  Allergic/Immunologic: Negative for susceptible to infections.  Neurological:  Positive for dizziness. Negative for headaches, memory loss and weakness.  Hematological:  Negative for bruising/bleeding tendency.  Psychiatric/Behavioral:  Positive for depressed mood and sleep disturbance. Negative for confusion. The patient is nervous/anxious.    PMFS History:  Patient Active Problem List   Diagnosis Date Noted   Hypertension 03/21/2021   Thyroid disease 03/21/2021   Arthritis 03/21/2021   Dysphagia 05/03/2019   Idiopathic small fiber peripheral neuropathy 12/31/2017   Carpal tunnel syndrome, left upper limb 11/19/2017   Idiopathic polyneuropathy 11/19/2017   Varicose veins of left lower extremity with complications 85/27/7824   Headache(784.0) 04/08/2014   Joint pain 04/08/2014   Other malaise and fatigue 04/08/2014  Neuropathy 04/08/2014   Achalasia 04/03/2014   Personal history of colonic polyps 04/03/2014   GERD (gastroesophageal reflux disease) 04/03/2014    Past Medical History:  Diagnosis Date   Abdominal pain    Allergic rhinitis    Anxiety 10/03/2013   Arthralgia 03/31/2014   Bronchitis 09/09/2013   Cellulitis    Diverticulitis    Diverticulosis    Fatigue    GERD (gastroesophageal reflux disease)     Gout    Headache    Headache    Hyperchloremia    Hypercholesterolemia    Hypertension    Hypothyroidism    Lumbar strain    Major depressive disorder    Migraine    Osteoporosis    Rheumatoid arthritis ([place])    Scoliosis    Sleep apnea     Family History  Problem Relation Age of Onset   Heart disease Mother    Alzheimer's disease Father    Alzheimer's disease Sister    Macular degeneration Sister    Arthritis Sister    Arthritis Sister    COPD Sister    Seizures Sister    Alcohol abuse Sister    Healthy Brother    Seizures Other        nephew   Liver cancer Other    Past Surgical History:  Procedure Laterality Date   CARPAL TUNNEL RELEASE     25 years ago   CARPAL TUNNEL RELEASE Left 05/17/2021   Procedure: CARPAL TUNNEL RELEASE;  Surgeon: Carole Civil, MD;  Location: [facility];  Service: Orthopedics;  Laterality: Left;  pt knows to arrive at 10:45   CHOLECYSTECTOMY     20 yrs ago   COLONOSCOPY     COLONOSCOPY N/A 10/25/2014   Procedure: COLONOSCOPY;  Surgeon: Rogene Houston, MD;  Location: [facility];  Service: Endoscopy;  Laterality: N/A;  [place] N/A 06/17/2019   Procedure: ESOPHAGEAL DILATION;  Surgeon: Rogene Houston, MD;  Location: [facility];  Service: Endoscopy;  Laterality: N/A;   ESOPHAGOGASTRODUODENOSCOPY N/A 10/25/2014   Procedure: ESOPHAGOGASTRODUODENOSCOPY (EGD);  Surgeon: Rogene Houston, MD;  Location: [facility];  Service: Endoscopy;  Laterality: N/A;   ESOPHAGOGASTRODUODENOSCOPY (EGD) WITH PROPOFOL N/A 06/17/2019   Procedure: ESOPHAGOGASTRODUODENOSCOPY (EGD) WITH PROPOFOL;  Surgeon: Rogene Houston, MD;  Location: [facility];  Service: Endoscopy;  Laterality: N/A;  130pm   HEMORROIDECTOMY     2005   POLYPECTOMY     TOTAL ABDOMINAL HYSTERECTOMY     Age 22   UPPER GASTROINTESTINAL ENDOSCOPY     Social History   Social History Narrative   Patient is divorced, no children.   Patient is a retired  Equities trader.   She lives at home alone, she has a friend who stays with her some   Patient is right handed.   Patient drinks 2 cups coffee daily.   Immunization History  Administered Date(s) Administered   Influenza-Unspecified 03/04/2018     Objective: Vital Signs: BP (!) 147/66 (BP Location: Right Arm, Patient Position: Sitting, Cuff Size: Normal)   Pulse 60   Ht _0  (1.651 m)   Wt 133 lb 9.6 oz (60.6 kg)   BMI 22.23 kg/m    Physical Exam Vitals and nursing note reviewed.  Constitutional:      Appearance: She is well-developed.  HENT:     Head: Normocephalic and atraumatic.  Eyes:     Conjunctiva/sclera: Conjunctivae normal.  Cardiovascular:  Rate and Rhythm: Normal rate and regular rhythm.     Heart sounds: Normal heart sounds.  Pulmonary:     Effort: Pulmonary effort is normal.     Breath sounds: Normal breath sounds.  Abdominal:     General: Bowel sounds are normal.     Palpations: Abdomen is soft.  Musculoskeletal:     Cervical back: Normal range of motion.  Lymphadenopathy:     Cervical: No cervical adenopathy.  Skin:    General: Skin is warm and dry.     Capillary Refill: Capillary refill takes less than 2 seconds.  Neurological:     Mental Status: She is alert and oriented to person, place, and time.  Psychiatric:        Behavior: Behavior normal.     Musculoskeletal Exam: C-spine was in good range of motion.  She has good range of motion of her lumbar spine.  The scoliosis was noted.  Shoulder joints, elbow joints, wrist joints, MCPs PIPs and DIPs with good range of motion.  She had bilateral DIP and PIP thickening.  No synovitis was noted.  Hip joints and knee joints with good range of motion without any warmth swelling or effusion.  She had no tenderness across ankles or MTPs.  CDAI Exam: CDAI Score: -- Patient Global: --; Provider Global: -- Swollen: --; Tender: -- Joint Exam 12/20/2021   No joint exam has been documented for this visit    There is currently no information documented on the homunculus. Go to the Rheumatology activity and complete the homunculus joint exam.  Investigation: No additional findings.  Imaging: No results found.  Recent Labs: Lab Results  Component Value Date   WBC 7.0 05/01/2021   HGB 12.7 05/01/2021   PLT 158 05/01/2021   NA 137 05/01/2021   K 4.7 05/01/2021   CL 104 05/01/2021   CO2 28 05/01/2021   GLUCOSE 92 05/01/2021   BUN 18 05/01/2021   CREATININE 0.56 05/01/2021   BILITOT 0.3 06/05/2020   ALKPHOS 85 06/05/2020   AST 21 06/05/2020   ALT 15 06/05/2020   PROT 7.2 06/05/2020   ALBUMIN 4.7 (H) 06/05/2020   CALCIUM 9.8 05/01/2021   GFRAA 87 06/05/2020    Speciality Comments: No specialty comments available.  Procedures:  No procedures performed Allergies: Dexmedetomidine, Ibuprofen, Zyrtec [cetirizine], and Flexeril [cyclobenzaprine]   Assessment / Plan:     Visit Diagnoses: Rheumatoid factor positive - 03/31/14: RF 14.1. Uric acid 4.6, ESR 6, ANA negative, TSH 2.340  Pain in both hands -patient complains of pain and discomfort in her bilateral hands over the last 10 years.  She was a previous patient of Dr. Charlestine Night.  Patient states he is to give her prednisone intermittently.  She has been taking meloxicam and Tylenol on as needed basis now.  She had no synovitis on examination.  Patient states she was evaluated by Dr. Trudie Reed in the past but no diagnosis was given to her.  Plan: XR Hand 2 View Right, XR Hand 2 View Left, x-rays showed generalized osteopenia, severe CMC narrowing, PIP and DIP narrowing consistent with osteoarthritis.  Right second and third MCP narrowing was noted.  That raises concern of inflammatory osteoarthritis or crystal induced arthropathy.  Patient had no synovitis on examination.  These findings were consistent with osteoarthritis of the foot.  Sedimentation rate, Rheumatoid factor, Cyclic citrul peptide antibody, IgG, Uric acid  Carpal tunnel  syndrome, left upper limb - 05/17/2021 by Dr.Harrison, doing well.  Pain in both  feet -she has discomfort in her feet which she relates to most likely peripheral neuropathy.  She has not noticed any joint swelling.  Plan: XR Foot 2 Views Right, XR Foot 2 Views Left.  X-ray findings are consistent with osteoarthritis.  No synovitis was noted on the examination today.  DDD (degenerative disc disease), lumbar -she has been having lower back pain and discomfort.  She was evaluated by Dr. Aline Brochure.  I reviewed x-rays obtained on March 21, 2021 by Dr. Aline Brochure showed degenerative disc disease and scoliosis.  X-ray findings were discussed with the patient.  History of gout-patient states she had only 1 episode of gout in her lifetime.  After she did dietary modification she had no episodes.  Medication monitoring encounter -she has been taking long-term NSAIDs.  She is concerned about the side effects.  I will check CMP today.  Plan: COMPLETE METABOLIC PANEL WITH GFR  Age-related osteoporosis without current pathological fracture - Patient states that she started alendronate about 3 months ago for osteoporosis.  She has been tolerating the medication well.  Use of calcium rich diet and vitamin D was discussed.  Idiopathic small fiber peripheral neuropathy - Diagnosed 2019 based on EMG.  She has been followed by Dr.Ahern.  Primary hypertension-her blood pressure was elevated today.  She was advised to monitor blood pressure closely.  She is on for verapamil 180 mg CR.  History of gastroesophageal reflux (GERD)  History of diverticulitis-in the past.  Personal history of colonic polyps  Thyroid disease  Varicose veins of left lower extremity with complications  History of migraine  Anxiety and depression-she is currently not taking any medications.  She believes that she will need treatment for anxiety and depression.  She lost her sister recently.  Former smoker - 1/2 PPDx 20 years , quit  smoking 2018  Orders: Orders Placed This Encounter  Procedures   XR Hand 2 View Right   XR Hand 2 View Left   XR Foot 2 Views Right   XR Foot 2 Views Left   COMPLETE METABOLIC PANEL WITH GFR   Sedimentation rate   Rheumatoid factor   Cyclic citrul peptide antibody, IgG   Uric acid   No orders of the defined types were placed in this encounter.   Follow-Up Instructions: Return for joint pain.   Bo Merino, MD  Note - This record has been created using Editor, commissioning.  Chart creation errors have been sought, but may not always  have been located. Such creation errors do not reflect on  the standard of medical care.

## 2021-12-20 ENCOUNTER — Ambulatory Visit (INDEPENDENT_AMBULATORY_CARE_PROVIDER_SITE_OTHER): Payer: Medicare Other | Admitting: Rheumatology

## 2021-12-20 ENCOUNTER — Ambulatory Visit (INDEPENDENT_AMBULATORY_CARE_PROVIDER_SITE_OTHER): Payer: Medicare Other

## 2021-12-20 ENCOUNTER — Encounter: Payer: Self-pay | Admitting: Rheumatology

## 2021-12-20 VITALS — BP 147/66 | HR 60 | Ht 65.0 in | Wt 133.6 lb

## 2021-12-20 DIAGNOSIS — Z8719 Personal history of other diseases of the digestive system: Secondary | ICD-10-CM

## 2021-12-20 DIAGNOSIS — M5136 Other intervertebral disc degeneration, lumbar region: Secondary | ICD-10-CM | POA: Diagnosis not present

## 2021-12-20 DIAGNOSIS — M79672 Pain in left foot: Secondary | ICD-10-CM | POA: Diagnosis not present

## 2021-12-20 DIAGNOSIS — M79642 Pain in left hand: Secondary | ICD-10-CM

## 2021-12-20 DIAGNOSIS — Z8669 Personal history of other diseases of the nervous system and sense organs: Secondary | ICD-10-CM

## 2021-12-20 DIAGNOSIS — G5602 Carpal tunnel syndrome, left upper limb: Secondary | ICD-10-CM | POA: Diagnosis not present

## 2021-12-20 DIAGNOSIS — M79641 Pain in right hand: Secondary | ICD-10-CM

## 2021-12-20 DIAGNOSIS — R7689 Other specified abnormal immunological findings in serum: Secondary | ICD-10-CM

## 2021-12-20 DIAGNOSIS — I10 Essential (primary) hypertension: Secondary | ICD-10-CM

## 2021-12-20 DIAGNOSIS — Z860100 Personal history of colon polyps, unspecified: Secondary | ICD-10-CM

## 2021-12-20 DIAGNOSIS — Z5181 Encounter for therapeutic drug level monitoring: Secondary | ICD-10-CM | POA: Diagnosis not present

## 2021-12-20 DIAGNOSIS — E079 Disorder of thyroid, unspecified: Secondary | ICD-10-CM

## 2021-12-20 DIAGNOSIS — R768 Other specified abnormal immunological findings in serum: Secondary | ICD-10-CM

## 2021-12-20 DIAGNOSIS — M810 Age-related osteoporosis without current pathological fracture: Secondary | ICD-10-CM

## 2021-12-20 DIAGNOSIS — M79671 Pain in right foot: Secondary | ICD-10-CM

## 2021-12-20 DIAGNOSIS — I83892 Varicose veins of left lower extremities with other complications: Secondary | ICD-10-CM

## 2021-12-20 DIAGNOSIS — F32A Depression, unspecified: Secondary | ICD-10-CM

## 2021-12-20 DIAGNOSIS — F419 Anxiety disorder, unspecified: Secondary | ICD-10-CM

## 2021-12-20 DIAGNOSIS — G609 Hereditary and idiopathic neuropathy, unspecified: Secondary | ICD-10-CM | POA: Diagnosis not present

## 2021-12-20 DIAGNOSIS — M51369 Other intervertebral disc degeneration, lumbar region without mention of lumbar back pain or lower extremity pain: Secondary | ICD-10-CM

## 2021-12-20 DIAGNOSIS — Z8739 Personal history of other diseases of the musculoskeletal system and connective tissue: Secondary | ICD-10-CM | POA: Diagnosis not present

## 2021-12-20 DIAGNOSIS — M2550 Pain in unspecified joint: Secondary | ICD-10-CM

## 2021-12-20 DIAGNOSIS — Z87891 Personal history of nicotine dependence: Secondary | ICD-10-CM

## 2021-12-20 NOTE — Patient Instructions (Signed)
Hand Exercises Hand exercises can be helpful for almost anyone. These exercises can strengthen the hands, improve flexibility and movement, and increase blood flow to the hands. These results can make work and daily tasks easier. Hand exercises can be especially helpful for people who have joint pain from arthritis or have nerve damage from overuse (carpal tunnel syndrome). These exercises can also help people who have injured a hand. Exercises Most of these hand exercises are gentle stretching and motion exercises. It is usually safe to do them often throughout the day. Warming up your hands before exercise may help to reduce stiffness. You can do this with gentle massage or by placing your hands in warm water for 10-15 minutes. It is normal to feel some stretching, pulling, tightness, or mild discomfort as you begin new exercises. This will gradually improve. Stop an exercise right away if you feel sudden, severe pain or your pain gets worse. Ask your health care provider which exercises are best for you. Knuckle bend or "claw" fist  Stand or sit with your arm, hand, and all five fingers pointed straight up. Make sure to keep your wrist straight during the exercise. Gently bend your fingers down toward your palm until the tips of your fingers are touching the top of your palm. Keep your big knuckle straight and just bend the small knuckles in your fingers. Hold this position for __________ seconds. Straighten (extend) your fingers back to the starting position. Repeat this exercise 5-10 times with each hand. Full finger fist  Stand or sit with your arm, hand, and all five fingers pointed straight up. Make sure to keep your wrist straight during the exercise. Gently bend your fingers into your palm until the tips of your fingers are touching the middle of your palm. Hold this position for __________ seconds. Extend your fingers back to the starting position, stretching every joint fully. Repeat  this exercise 5-10 times with each hand. Straight fist Stand or sit with your arm, hand, and all five fingers pointed straight up. Make sure to keep your wrist straight during the exercise. Gently bend your fingers at the big knuckle, where your fingers meet your hand, and the middle knuckle. Keep the knuckle at the tips of your fingers straight and try to touch the bottom of your palm. Hold this position for __________ seconds. Extend your fingers back to the starting position, stretching every joint fully. Repeat this exercise 5-10 times with each hand. Tabletop  Stand or sit with your arm, hand, and all five fingers pointed straight up. Make sure to keep your wrist straight during the exercise. Gently bend your fingers at the big knuckle, where your fingers meet your hand, as far down as you can while keeping the small knuckles in your fingers straight. Think of forming a tabletop with your fingers. Hold this position for __________ seconds. Extend your fingers back to the starting position, stretching every joint fully. Repeat this exercise 5-10 times with each hand. Finger spread  Place your hand flat on a table with your palm facing down. Make sure your wrist stays straight as you do this exercise. Spread your fingers and thumb apart from each other as far as you can until you feel a gentle stretch. Hold this position for __________ seconds. Bring your fingers and thumb tight together again. Hold this position for __________ seconds. Repeat this exercise 5-10 times with each hand. Making circles  Stand or sit with your arm, hand, and all five fingers pointed   straight up. Make sure to keep your wrist straight during the exercise. Make a circle by touching the tip of your thumb to the tip of your index finger. Hold for __________ seconds. Then open your hand wide. Repeat this motion with your thumb and each finger on your hand. Repeat this exercise 5-10 times with each hand. Thumb  motion  Sit with your forearm resting on a table and your wrist straight. Your thumb should be facing up toward the ceiling. Keep your fingers relaxed as you move your thumb. Lift your thumb up as high as you can toward the ceiling. Hold for __________ seconds. Bend your thumb across your palm as far as you can, reaching the tip of your thumb for the small finger (pinkie) side of your palm. Hold for __________ seconds. Repeat this exercise 5-10 times with each hand. Grip strengthening  Hold a stress ball or other soft ball in the middle of your hand. Slowly increase the pressure, squeezing the ball as much as you can without causing pain. Think of bringing the tips of your fingers into the middle of your palm. All of your finger joints should bend when doing this exercise. Hold your squeeze for __________ seconds, then relax. Repeat this exercise 5-10 times with each hand. Contact a health care provider if: Your hand pain or discomfort gets much worse when you do an exercise. Your hand pain or discomfort does not improve within 2 hours after you exercise. If you have any of these problems, stop doing these exercises right away. Do not do them again unless your health care provider says that you can. Get help right away if: You develop sudden, severe hand pain or swelling. If this happens, stop doing these exercises right away. Do not do them again unless your health care provider says that you can. This information is not intended to replace advice given to you by your health care provider. Make sure you discuss any questions you have with your health care provider. Document Revised: 11/08/2020 Document Reviewed: 11/08/2020 Elsevier Patient Education  2023 [employer]  Back Exercises The following exercises strengthen the muscles that help to support the trunk (torso) and back. They also help to keep the lower back flexible. Doing these exercises can help to prevent or lessen existing low  back pain. If you have back pain or discomfort, try doing these exercises 2-3 times each day or as told by your health care provider. As your pain improves, do them once each day, but increase the number of times that you repeat the steps for each exercise (do more repetitions). To prevent the recurrence of back pain, continue to do these exercises once each day or as told by your health care provider. Do exercises exactly as told by your health care provider and adjust them as directed. It is normal to feel mild stretching, pulling, tightness, or discomfort as you do these exercises, but you should stop right away if you feel sudden pain or your pain gets worse. Exercises Single knee to chest Repeat these steps 3-5 times for each leg: Lie on your back on a firm bed or the floor with your legs extended. Bring one knee to your chest. Your other leg should stay extended and in contact with the floor. Hold your knee in place by grabbing your knee or thigh with both hands and hold. Pull on your knee until you feel a gentle stretch in your lower back or buttocks. Hold the stretch for 10-30   seconds. Slowly release and straighten your leg.  Pelvic tilt Repeat these steps 5-10 times: Lie on your back on a firm bed or the floor with your legs extended. Bend your knees so they are pointing toward the ceiling and your feet are flat on the floor. Tighten your lower abdominal muscles to press your lower back against the floor. This motion will tilt your pelvis so your tailbone points up toward the ceiling instead of pointing to your feet or the floor. With gentle tension and even breathing, hold this position for 5-10 seconds.  Cat-cow Repeat these steps until your lower back becomes more flexible: Get into a hands-and-knees position on a firm bed or the floor. Keep your hands under your shoulders, and keep your knees under your hips. You may place padding under your knees for comfort. Let your head hang  down toward your chest. Contract your abdominal muscles and point your tailbone toward the floor so your lower back becomes rounded like the back of a cat. Hold this position for 5 seconds. Slowly lift your head, let your abdominal muscles relax, and point your tailbone up toward the ceiling so your back forms a sagging arch like the back of a cow. Hold this position for 5 seconds.  Press-ups Repeat these steps 5-10 times: Lie on your abdomen (face-down) on a firm bed or the floor. Place your palms near your head, about shoulder-width apart. Keeping your back as relaxed as possible and keeping your hips on the floor, slowly straighten your arms to raise the top half of your body and lift your shoulders. Do not use your back muscles to raise your upper torso. You may adjust the placement of your hands to make yourself more comfortable. Hold this position for 5 seconds while you keep your back relaxed. Slowly return to lying flat on the floor.  Bridges Repeat these steps 10 times: Lie on your back on a firm bed or the floor. Bend your knees so they are pointing toward the ceiling and your feet are flat on the floor. Your arms should be flat at your sides, next to your body. Tighten your buttocks muscles and lift your buttocks off the floor until your waist is at almost the same height as your knees. You should feel the muscles working in your buttocks and the back of your thighs. If you do not feel these muscles, slide your feet 1-2 inches (2.5-5 cm) farther away from your buttocks. Hold this position for 3-5 seconds. Slowly lower your hips to the starting position, and allow your buttocks muscles to relax completely. If this exercise is too easy, try doing it with your arms crossed over your chest. Abdominal crunches Repeat these steps 5-10 times: Lie on your back on a firm bed or the floor with your legs extended. Bend your knees so they are pointing toward the ceiling and your feet are flat  on the floor. Cross your arms over your chest. Tip your chin slightly toward your chest without bending your neck. Tighten your abdominal muscles and slowly raise your torso high enough to lift your shoulder blades a tiny bit off the floor. Avoid raising your torso higher than that because it can put too much stress on your lower back and does not help to strengthen your abdominal muscles. Slowly return to your starting position.  Back lifts Repeat these steps 5-10 times: Lie on your abdomen (face-down) with your arms at your sides, and rest your forehead on the floor.   Tighten the muscles in your legs and your buttocks. Slowly lift your chest off the floor while you keep your hips pressed to the floor. Keep the back of your head in line with the curve in your back. Your eyes should be looking at the floor. Hold this position for 3-5 seconds. Slowly return to your starting position.  Contact a health care provider if: Your back pain or discomfort gets much worse when you do an exercise. Your worsening back pain or discomfort does not lessen within 2 hours after you exercise. If you have any of these problems, stop doing these exercises right away. Do not do them again unless your health care provider says that you can. Get help right away if: You develop sudden, severe back pain. If this happens, stop doing the exercises right away. Do not do them again unless your health care provider says that you can. This information is not intended to replace advice given to you by your health care provider. Make sure you discuss any questions you have with your health care provider. Document Revised: 01/15/2021 Document Reviewed: 10/03/2020 Elsevier Patient Education  2023 [employer]  

## 2021-12-22 LAB — COMPLETE METABOLIC PANEL WITH GFR
AG Ratio: 2 (calc) (ref 1.0–2.5)
ALT: 13 U/L (ref 6–29)
AST: 18 U/L (ref 10–35)
Albumin: 4.4 g/dL (ref 3.6–5.1)
Alkaline phosphatase (APISO): 57 U/L (ref 37–153)
BUN: 24 mg/dL (ref 7–25)
CO2: 26 mmol/L (ref 20–32)
Calcium: 9.8 mg/dL (ref 8.6–10.4)
Chloride: 107 mmol/L (ref 98–110)
Creat: 0.67 mg/dL (ref 0.60–0.95)
Globulin: 2.2 g/dL (calc) (ref 1.9–3.7)
Glucose, Bld: 81 mg/dL (ref 65–99)
Potassium: 4.9 mmol/L (ref 3.5–5.3)
Sodium: 142 mmol/L (ref 135–146)
Total Bilirubin: 0.5 mg/dL (ref 0.2–1.2)
Total Protein: 6.6 g/dL (ref 6.1–8.1)
eGFR: 87 mL/min/{1.73_m2} (ref >=60)

## 2021-12-22 LAB — SEDIMENTATION RATE: Sed Rate: 6 mm/h (ref 0–30)

## 2021-12-22 LAB — CYCLIC CITRUL PEPTIDE ANTIBODY, IGG: Cyclic Citrullin Peptide Ab: <16 UNITS

## 2021-12-22 LAB — URIC ACID: Uric Acid, Serum: 4.2 mg/dL (ref 2.5–7.0)

## 2021-12-22 LAB — RHEUMATOID FACTOR: Rheumatoid fact SerPl-aCnc: <14 IU/mL (ref <14)

## 2021-12-22 NOTE — Progress Notes (Signed)
All the labs are within normal limits.  I will discuss results at the follow-up visit.

## 2021-12-25 DIAGNOSIS — F419 Anxiety disorder, unspecified: Secondary | ICD-10-CM | POA: Diagnosis not present

## 2021-12-25 DIAGNOSIS — F688 Other specified disorders of adult personality and behavior: Secondary | ICD-10-CM | POA: Diagnosis not present

## 2021-12-25 DIAGNOSIS — Z6823 Body mass index (BMI) 23.0-23.9, adult: Secondary | ICD-10-CM | POA: Diagnosis not present

## 2021-12-25 DIAGNOSIS — M2550 Pain in unspecified joint: Secondary | ICD-10-CM | POA: Diagnosis not present

## 2021-12-25 DIAGNOSIS — S80862A Insect bite (nonvenomous), left lower leg, initial encounter: Secondary | ICD-10-CM | POA: Diagnosis not present

## 2022-01-06 NOTE — Progress Notes (Signed)
Office Visit Note  Patient: Megan Daniel             Date of Birth: [birth year]           MRN: [id number]             PCP: Manon Hilding, MD Referring: Manon Hilding, MD Visit Date: 01/15/2022 Occupation: @GUAROCC @  Subjective:  Pain in both hands  History of Present Illness: Megan Daniel is a 86 y.o. female with history of osteoarthritis.  She states that she continues to have pain and discomfort in her bilateral hands.  She states that she does not of yard work and experiences more pain after the yard work.  She has not noticed any joint swelling.  She also has some lower back pain for which she has seen Dr. Aline Brochure in the past.  She states she does core strengthening exercises every morning.  She denies any discomfort in her feet.  Activities of Daily Living:  Patient reports morning stiffness for 0  none .   Patient Denies nocturnal pain.  Difficulty dressing/grooming: Denies Difficulty climbing stairs: Denies Difficulty getting out of chair: Denies Difficulty using hands for taps, buttons, cutlery, and/or writing: Denies  Review of Systems  Constitutional:  Positive for fatigue.  HENT:  Negative for mouth dryness.   Eyes:  Negative for dryness.  Respiratory:  Negative for shortness of breath.   Cardiovascular:  Positive for swelling in legs/feet.  Gastrointestinal:  Negative for constipation.  Endocrine: Positive for cold intolerance.  Genitourinary:  Negative for difficulty urinating.  Musculoskeletal:  Positive for gait problem and joint swelling. Negative for joint pain and joint pain.  Skin:  Negative for color change, rash and sensitivity to sunlight.  Allergic/Immunologic: Negative for susceptible to infections.  Neurological:  Positive for light-headedness and numbness.  Hematological:  Negative for bruising/bleeding tendency.  Psychiatric/Behavioral:  Positive for depressed mood. Negative for sleep disturbance. The patient is nervous/anxious.      PMFS History:  Patient Active Problem List   Diagnosis Date Noted   Hypertension 03/21/2021   Thyroid disease 03/21/2021   Arthritis 03/21/2021   Dysphagia 05/03/2019   Idiopathic small fiber peripheral neuropathy 12/31/2017   Carpal tunnel syndrome, left upper limb 11/19/2017   Idiopathic polyneuropathy 11/19/2017   Varicose veins of left lower extremity with complications 02/01/1600   Headache(784.0) 04/08/2014   Joint pain 04/08/2014   Other malaise and fatigue 04/08/2014   Neuropathy 04/08/2014   Achalasia 04/03/2014   Personal history of colonic polyps 04/03/2014   GERD (gastroesophageal reflux disease) 04/03/2014    Past Medical History:  Diagnosis Date   Abdominal pain    Allergic rhinitis    Anxiety 10/03/2013   Arthralgia 03/31/2014   Bronchitis 09/09/2013   Cellulitis    Diverticulitis    Diverticulosis    Fatigue    GERD (gastroesophageal reflux disease)    Gout    Headache    Headache    Hyperchloremia    Hypercholesterolemia    Hypertension    Hypothyroidism    Lumbar strain    Major depressive disorder    Migraine    Osteoporosis    Rheumatoid arthritis ([place])    Scoliosis    Sleep apnea     Family History  Problem Relation Age of Onset   Heart disease Mother    Alzheimer's disease Father    Alzheimer's disease Sister    Macular degeneration Sister    Arthritis Sister  Arthritis Sister    Seizures Sister    Alcohol abuse Sister    COPD Sister    Healthy Brother    Seizures Other        nephew   Liver cancer Other    Past Surgical History:  Procedure Laterality Date   CARPAL TUNNEL RELEASE     25 years ago   CARPAL TUNNEL RELEASE Left 05/17/2021   Procedure: CARPAL TUNNEL RELEASE;  Surgeon: Carole Civil, MD;  Location: [facility];  Service: Orthopedics;  Laterality: Left;  pt knows to arrive at 10:45   CHOLECYSTECTOMY     20 yrs ago   COLONOSCOPY     COLONOSCOPY N/A 10/25/2014   Procedure: COLONOSCOPY;  Surgeon: Rogene Houston, MD;  Location: [facility];  Service: Endoscopy;  Laterality: N/A;  [place] N/A 06/17/2019   Procedure: ESOPHAGEAL DILATION;  Surgeon: Rogene Houston, MD;  Location: [facility];  Service: Endoscopy;  Laterality: N/A;   ESOPHAGOGASTRODUODENOSCOPY N/A 10/25/2014   Procedure: ESOPHAGOGASTRODUODENOSCOPY (EGD);  Surgeon: Rogene Houston, MD;  Location: [facility];  Service: Endoscopy;  Laterality: N/A;   ESOPHAGOGASTRODUODENOSCOPY (EGD) WITH PROPOFOL N/A 06/17/2019   Procedure: ESOPHAGOGASTRODUODENOSCOPY (EGD) WITH PROPOFOL;  Surgeon: Rogene Houston, MD;  Location: [facility];  Service: Endoscopy;  Laterality: N/A;  130pm   HEMORROIDECTOMY     2005   POLYPECTOMY     TOTAL ABDOMINAL HYSTERECTOMY     Age 44   UPPER GASTROINTESTINAL ENDOSCOPY     Social History   Social History Narrative   Patient is divorced, no children.   Patient is a retired Equities trader.   She lives at home alone, she has a friend who stays with her some   Patient is right handed.   Patient drinks 2 cups coffee daily.   Immunization History  Administered Date(s) Administered   Influenza-Unspecified 03/04/2018     Objective: Vital Signs: BP (!) 156/67 (BP Location: Left Arm, Patient Position: Sitting, Cuff Size: Normal)   Pulse 60   Resp 16   Ht $R'5\' 5"'ru$  (1.651 m)   Wt 134 lb (60.8 kg)   BMI 22.30 kg/m    Physical Exam Vitals and nursing note reviewed.  Constitutional:      Appearance: She is well-developed.  HENT:     Head: Normocephalic and atraumatic.  Eyes:     Conjunctiva/sclera: Conjunctivae normal.  Cardiovascular:     Rate and Rhythm: Normal rate and regular rhythm.     Heart sounds: Normal heart sounds.  Pulmonary:     Effort: Pulmonary effort is normal.     Breath sounds: Normal breath sounds.  Abdominal:     General: Bowel sounds are normal.     Palpations: Abdomen is soft.  Musculoskeletal:     Cervical back: Normal range of motion.   Lymphadenopathy:     Cervical: No cervical adenopathy.  Skin:    General: Skin is warm and dry.     Capillary Refill: Capillary refill takes less than 2 seconds.  Neurological:     Mental Status: She is alert and oriented to person, place, and time.  Psychiatric:        Behavior: Behavior normal.      Musculoskeletal Exam: C-spine was in good range of motion.  She good range of motion of thoracic and lumbar spine without any point tenderness.  Shoulder joints, elbow joints, wrist joints with good range of motion.  There was no  synovitis of the joints joints or MCP joints.  PIP and DIP thickening and bilateral CMC subluxation was noted.  Hip joints and knee joints with good range of motion.  There was no tenderness over ankles or MTPs.  CDAI Exam: CDAI Score: -- Patient Global: --; Provider Global: -- Swollen: --; Tender: -- Joint Exam 01/15/2022   No joint exam has been documented for this visit   There is currently no information documented on the homunculus. Go to the Rheumatology activity and complete the homunculus joint exam.  Investigation: No additional findings.  Imaging: XR Hand 2 View Right  Result Date: 12/20/2021 Generalized osteopenia was noted.  Second and third MCP joint narrowing was noted.  PIP and DIP narrowing was noted.  CMC narrowing was noted.  No intercarpal or radiocarpal joint space narrowing was noted.  No erosive changes were noted.  Extra-articular calcification was noted in the wrist joint. Impression: These findings are consistent with osteoarthritis of the hand.  Narrowing of the MCP joints raises concern of inflammatory arthritis or crystal-induced arthropathy.  XR Foot 2 Views Left  Result Date: 12/20/2021 First MTP, PIP and DIP narrowing was noted.  No intertarsal, tibiotalar or subtalar joint space narrowing was noted.  Inferior and posterior calcaneal spurs were noted.  No erosive changes were noted. Impression: These findings are consistent  with osteoarthritis of the foot.  XR Foot 2 Views Right  Result Date: 12/20/2021 PIP and DIP narrowing was noted.  No MTP, intertarsal, tibiotalar or subtalar joint space narrowing was noted.  Inferior and posterior calcaneal spurs were noted.  No erosive changes were noted. Impression: These findings were consistent with osteoarthritis of the foot.  XR Hand 2 View Left  Result Date: 12/20/2021 Generalized osteopenia was noted.  Severe CMC narrowing and subluxation was noted.  PIP and DIP narrowing was noted.  Subluxation of first DIP joint was noted.  No intercarpal or radiocarpal joint space narrowing was noted.  No erosive changes were noted. Impression: These findings are consistent with osteoarthritis of the hand.   Recent Labs: Lab Results  Component Value Date   WBC 7.0 05/01/2021   HGB 12.7 05/01/2021   PLT 158 05/01/2021   NA 142 12/20/2021   K 4.9 12/20/2021   CL 107 12/20/2021   CO2 26 12/20/2021   GLUCOSE 81 12/20/2021   BUN 24 12/20/2021   CREATININE 0.67 12/20/2021   BILITOT 0.5 12/20/2021   ALKPHOS 85 06/05/2020   AST 18 12/20/2021   ALT 13 12/20/2021   PROT 6.6 12/20/2021   ALBUMIN 4.7 (H) 06/05/2020   CALCIUM 9.8 12/20/2021   GFRAA 87 06/05/2020   Dec 20, 2021 ESR 6, RF negative, anti-CCP negative, uric acid 4.2  Speciality Comments: No specialty comments available.  Procedures:  No procedures performed Allergies: Dexmedetomidine, Ibuprofen, Zyrtec [cetirizine], and Flexeril [cyclobenzaprine]   Assessment / Plan:     Visit Diagnoses:   Pain in both hands - Treated with intermittent prednisone by Dr. Charlestine Night.  She had low titer positive rheumatoid factor in 2015.  Rheumatoid factor is negative now.  Anti-CCP is negative.  Lab results were discussed with the patient.  X-rays showed osteoarthritis and right second and third MCP narrowing d/d inflammatory OA or crystal induced arthropathy.  X-ray findings were reviewed with the patient.  She had some  calcification in the right wrist joint.  She had no synovitis on examination.  She denies any joint swelling.  Joint protection muscle strengthening was discussed.  A handout on  hand exercises was given.  Carpal tunnel syndrome, left upper limb - May 17, 2021 by Dr. Aline Brochure.  Patient had good response to the surgery.  Primary osteoarthritis of both feet - Clinical and radiographic findings are consistent with osteoarthritis.  Proper fitting shoes were discussed.  DDD (degenerative disc disease), lumbar - X-rays done by Dr. Aline Brochure on March 21, 2021.  She continues to have some lower back pain intermittently.  She states the pain is usually exacerbated after yard work.  She has been doing core strengthening exercises.  Some of the exercises were demonstrated in office today.  History of gout - Only 1 episode per patient with no recurrence.  Her uric acid is in desirable range at 4.2.  Lab results were discussed with the patient.  Age-related osteoporosis without current pathological fracture - Alendronate 70 mg p.o. weekly started in February 2022.  Patient states that she gets reflux and esophageal achalasia from alendronate.  I discussed the option of IV Reclast.  Patient will discuss that further with Dr. Quintin Alto.  I advised her to get evaluation by a dentist prior to starting on Reclast.  Use of calcium rich diet and exercise was emphasized.  Idiopathic small fiber peripheral neuropathy - Followed by Dr. Jaynee Eagles.  Primary hypertension-systolic blood pressure was elevated today.  Advised her to monitor blood pressure closely.  Other medical problems are listed as follows:  History of gastroesophageal reflux (GERD)  History of diverticulitis  Personal history of colonic polyps  Varicose veins of left lower extremity with complications  History of migraine  Anxiety and depression - Her depression is worse due to recent loss of her sister.  Thyroid disease  Former smoker - Half  pack per day for 20 years.  She quit smoking in 2018.  Orders: No orders of the defined types were placed in this encounter.  No orders of the defined types were placed in this encounter.    Follow-Up Instructions: Return in about 1 year (around 01/16/2023) for Osteoarthritis.   Bo Merino, MD  Note - This record has been created using Editor, commissioning.  Chart creation errors have been sought, but may not always  have been located. Such creation errors do not reflect on  the standard of medical care.

## 2022-01-15 ENCOUNTER — Ambulatory Visit (INDEPENDENT_AMBULATORY_CARE_PROVIDER_SITE_OTHER): Payer: Medicare Other | Admitting: Rheumatology

## 2022-01-15 ENCOUNTER — Encounter: Payer: Self-pay | Admitting: Rheumatology

## 2022-01-15 VITALS — BP 156/67 | HR 60 | Resp 16 | Ht 65.0 in | Wt 134.0 lb

## 2022-01-15 DIAGNOSIS — Z87891 Personal history of nicotine dependence: Secondary | ICD-10-CM

## 2022-01-15 DIAGNOSIS — I10 Essential (primary) hypertension: Secondary | ICD-10-CM | POA: Diagnosis not present

## 2022-01-15 DIAGNOSIS — G5602 Carpal tunnel syndrome, left upper limb: Secondary | ICD-10-CM | POA: Diagnosis not present

## 2022-01-15 DIAGNOSIS — Z8669 Personal history of other diseases of the nervous system and sense organs: Secondary | ICD-10-CM | POA: Diagnosis not present

## 2022-01-15 DIAGNOSIS — Z8739 Personal history of other diseases of the musculoskeletal system and connective tissue: Secondary | ICD-10-CM | POA: Diagnosis not present

## 2022-01-15 DIAGNOSIS — R768 Other specified abnormal immunological findings in serum: Secondary | ICD-10-CM

## 2022-01-15 DIAGNOSIS — M19071 Primary osteoarthritis, right ankle and foot: Secondary | ICD-10-CM | POA: Diagnosis not present

## 2022-01-15 DIAGNOSIS — M19072 Primary osteoarthritis, left ankle and foot: Secondary | ICD-10-CM

## 2022-01-15 DIAGNOSIS — Z5181 Encounter for therapeutic drug level monitoring: Secondary | ICD-10-CM

## 2022-01-15 DIAGNOSIS — F32A Depression, unspecified: Secondary | ICD-10-CM

## 2022-01-15 DIAGNOSIS — F419 Anxiety disorder, unspecified: Secondary | ICD-10-CM

## 2022-01-15 DIAGNOSIS — M810 Age-related osteoporosis without current pathological fracture: Secondary | ICD-10-CM

## 2022-01-15 DIAGNOSIS — M79641 Pain in right hand: Secondary | ICD-10-CM

## 2022-01-15 DIAGNOSIS — M79642 Pain in left hand: Secondary | ICD-10-CM

## 2022-01-15 DIAGNOSIS — G609 Hereditary and idiopathic neuropathy, unspecified: Secondary | ICD-10-CM | POA: Diagnosis not present

## 2022-01-15 DIAGNOSIS — Z86010 Personal history of colonic polyps: Secondary | ICD-10-CM

## 2022-01-15 DIAGNOSIS — Z8719 Personal history of other diseases of the digestive system: Secondary | ICD-10-CM

## 2022-01-15 DIAGNOSIS — E079 Disorder of thyroid, unspecified: Secondary | ICD-10-CM

## 2022-01-15 DIAGNOSIS — M5136 Other intervertebral disc degeneration, lumbar region: Secondary | ICD-10-CM

## 2022-01-15 DIAGNOSIS — I83892 Varicose veins of left lower extremities with other complications: Secondary | ICD-10-CM | POA: Diagnosis not present

## 2022-01-15 NOTE — Patient Instructions (Signed)
Hand Exercises Hand exercises can be helpful for almost anyone. These exercises can strengthen the hands, improve flexibility and movement, and increase blood flow to the hands. These results can make work and daily tasks easier. Hand exercises can be especially helpful for people who have joint pain from arthritis or have nerve damage from overuse (carpal tunnel syndrome). These exercises can also help people who have injured a hand. Exercises Most of these hand exercises are gentle stretching and motion exercises. It is usually safe to do them often throughout the day. Warming up your hands before exercise may help to reduce stiffness. You can do this with gentle massage or by placing your hands in warm water for 10-15 minutes. It is normal to feel some stretching, pulling, tightness, or mild discomfort as you begin new exercises. This will gradually improve. Stop an exercise right away if you feel sudden, severe pain or your pain gets worse. Ask your health care provider which exercises are best for you. Knuckle bend or "claw" fist  Stand or sit with your arm, hand, and all five fingers pointed straight up. Make sure to keep your wrist straight during the exercise. Gently bend your fingers down toward your palm until the tips of your fingers are touching the top of your palm. Keep your big knuckle straight and just bend the small knuckles in your fingers. Hold this position for __________ seconds. Straighten (extend) your fingers back to the starting position. Repeat this exercise 5-10 times with each hand. Full finger fist  Stand or sit with your arm, hand, and all five fingers pointed straight up. Make sure to keep your wrist straight during the exercise. Gently bend your fingers into your palm until the tips of your fingers are touching the middle of your palm. Hold this position for __________ seconds. Extend your fingers back to the starting position, stretching every joint fully. Repeat  this exercise 5-10 times with each hand. Straight fist Stand or sit with your arm, hand, and all five fingers pointed straight up. Make sure to keep your wrist straight during the exercise. Gently bend your fingers at the big knuckle, where your fingers meet your hand, and the middle knuckle. Keep the knuckle at the tips of your fingers straight and try to touch the bottom of your palm. Hold this position for __________ seconds. Extend your fingers back to the starting position, stretching every joint fully. Repeat this exercise 5-10 times with each hand. Tabletop  Stand or sit with your arm, hand, and all five fingers pointed straight up. Make sure to keep your wrist straight during the exercise. Gently bend your fingers at the big knuckle, where your fingers meet your hand, as far down as you can while keeping the small knuckles in your fingers straight. Think of forming a tabletop with your fingers. Hold this position for __________ seconds. Extend your fingers back to the starting position, stretching every joint fully. Repeat this exercise 5-10 times with each hand. Finger spread  Place your hand flat on a table with your palm facing down. Make sure your wrist stays straight as you do this exercise. Spread your fingers and thumb apart from each other as far as you can until you feel a gentle stretch. Hold this position for __________ seconds. Bring your fingers and thumb tight together again. Hold this position for __________ seconds. Repeat this exercise 5-10 times with each hand. Making circles  Stand or sit with your arm, hand, and all five fingers pointed   straight up. Make sure to keep your wrist straight during the exercise. Make a circle by touching the tip of your thumb to the tip of your index finger. Hold for __________ seconds. Then open your hand wide. Repeat this motion with your thumb and each finger on your hand. Repeat this exercise 5-10 times with each hand. Thumb  motion  Sit with your forearm resting on a table and your wrist straight. Your thumb should be facing up toward the ceiling. Keep your fingers relaxed as you move your thumb. Lift your thumb up as high as you can toward the ceiling. Hold for __________ seconds. Bend your thumb across your palm as far as you can, reaching the tip of your thumb for the small finger (pinkie) side of your palm. Hold for __________ seconds. Repeat this exercise 5-10 times with each hand. Grip strengthening  Hold a stress ball or other soft ball in the middle of your hand. Slowly increase the pressure, squeezing the ball as much as you can without causing pain. Think of bringing the tips of your fingers into the middle of your palm. All of your finger joints should bend when doing this exercise. Hold your squeeze for __________ seconds, then relax. Repeat this exercise 5-10 times with each hand. Contact a health care provider if: Your hand pain or discomfort gets much worse when you do an exercise. Your hand pain or discomfort does not improve within 2 hours after you exercise. If you have any of these problems, stop doing these exercises right away. Do not do them again unless your health care provider says that you can. Get help right away if: You develop sudden, severe hand pain or swelling. If this happens, stop doing these exercises right away. Do not do them again unless your health care provider says that you can. This information is not intended to replace advice given to you by your health care provider. Make sure you discuss any questions you have with your health care provider. Document Revised: 11/08/2020 Document Reviewed: 11/08/2020 [employer] Patient Education  2023 [employer]  

## 2022-02-05 DIAGNOSIS — E039 Hypothyroidism, unspecified: Secondary | ICD-10-CM | POA: Diagnosis not present

## 2022-02-05 DIAGNOSIS — I8390 Asymptomatic varicose veins of unspecified lower extremity: Secondary | ICD-10-CM | POA: Diagnosis not present

## 2022-02-05 DIAGNOSIS — R011 Cardiac murmur, unspecified: Secondary | ICD-10-CM | POA: Diagnosis not present

## 2022-02-05 DIAGNOSIS — F330 Major depressive disorder, recurrent, mild: Secondary | ICD-10-CM | POA: Diagnosis not present

## 2022-02-05 DIAGNOSIS — W57XXXA Bitten or stung by nonvenomous insect and other nonvenomous arthropods, initial encounter: Secondary | ICD-10-CM | POA: Diagnosis not present

## 2022-02-19 DIAGNOSIS — R011 Cardiac murmur, unspecified: Secondary | ICD-10-CM | POA: Diagnosis not present

## 2022-02-19 DIAGNOSIS — I088 Other rheumatic multiple valve diseases: Secondary | ICD-10-CM | POA: Diagnosis not present

## 2022-02-25 DIAGNOSIS — R030 Elevated blood-pressure reading, without diagnosis of hypertension: Secondary | ICD-10-CM | POA: Diagnosis not present

## 2022-02-25 DIAGNOSIS — S30861A Insect bite (nonvenomous) of abdominal wall, initial encounter: Secondary | ICD-10-CM | POA: Diagnosis not present

## 2022-02-25 DIAGNOSIS — Z6824 Body mass index (BMI) 24.0-24.9, adult: Secondary | ICD-10-CM | POA: Diagnosis not present

## 2022-04-03 DIAGNOSIS — Z6823 Body mass index (BMI) 23.0-23.9, adult: Secondary | ICD-10-CM | POA: Diagnosis not present

## 2022-04-03 DIAGNOSIS — I10 Essential (primary) hypertension: Secondary | ICD-10-CM | POA: Diagnosis not present

## 2022-05-08 DIAGNOSIS — E039 Hypothyroidism, unspecified: Secondary | ICD-10-CM | POA: Diagnosis not present

## 2022-05-08 DIAGNOSIS — F330 Major depressive disorder, recurrent, mild: Secondary | ICD-10-CM | POA: Diagnosis not present

## 2022-05-08 DIAGNOSIS — I10 Essential (primary) hypertension: Secondary | ICD-10-CM | POA: Diagnosis not present

## 2022-05-08 DIAGNOSIS — I8390 Asymptomatic varicose veins of unspecified lower extremity: Secondary | ICD-10-CM | POA: Diagnosis not present

## 2022-05-08 DIAGNOSIS — G43909 Migraine, unspecified, not intractable, without status migrainosus: Secondary | ICD-10-CM | POA: Diagnosis not present

## 2022-05-08 DIAGNOSIS — M5430 Sciatica, unspecified side: Secondary | ICD-10-CM | POA: Diagnosis not present

## 2022-05-08 DIAGNOSIS — K2100 Gastro-esophageal reflux disease with esophagitis, without bleeding: Secondary | ICD-10-CM | POA: Diagnosis not present

## 2022-05-08 DIAGNOSIS — E559 Vitamin D deficiency, unspecified: Secondary | ICD-10-CM | POA: Diagnosis not present

## 2022-05-08 DIAGNOSIS — M069 Rheumatoid arthritis, unspecified: Secondary | ICD-10-CM | POA: Diagnosis not present

## 2022-05-08 DIAGNOSIS — R011 Cardiac murmur, unspecified: Secondary | ICD-10-CM | POA: Diagnosis not present

## 2022-05-08 DIAGNOSIS — Z23 Encounter for immunization: Secondary | ICD-10-CM | POA: Diagnosis not present

## 2022-05-08 DIAGNOSIS — E7849 Other hyperlipidemia: Secondary | ICD-10-CM | POA: Diagnosis not present

## 2022-06-09 DIAGNOSIS — H43813 Vitreous degeneration, bilateral: Secondary | ICD-10-CM | POA: Diagnosis not present

## 2022-06-09 DIAGNOSIS — Z961 Presence of intraocular lens: Secondary | ICD-10-CM | POA: Diagnosis not present

## 2022-06-09 DIAGNOSIS — H353131 Nonexudative age-related macular degeneration, bilateral, early dry stage: Secondary | ICD-10-CM | POA: Diagnosis not present

## 2022-06-12 DIAGNOSIS — H33321 Round hole, right eye: Secondary | ICD-10-CM | POA: Diagnosis not present

## 2022-06-14 ENCOUNTER — Encounter (INDEPENDENT_AMBULATORY_CARE_PROVIDER_SITE_OTHER): Payer: Self-pay | Admitting: Gastroenterology

## 2022-06-20 DIAGNOSIS — R030 Elevated blood-pressure reading, without diagnosis of hypertension: Secondary | ICD-10-CM | POA: Diagnosis not present

## 2022-06-20 DIAGNOSIS — Z6824 Body mass index (BMI) 24.0-24.9, adult: Secondary | ICD-10-CM | POA: Diagnosis not present

## 2022-06-20 DIAGNOSIS — R0781 Pleurodynia: Secondary | ICD-10-CM | POA: Diagnosis not present

## 2022-07-14 DIAGNOSIS — R030 Elevated blood-pressure reading, without diagnosis of hypertension: Secondary | ICD-10-CM | POA: Diagnosis not present

## 2022-07-14 DIAGNOSIS — J101 Influenza due to other identified influenza virus with other respiratory manifestations: Secondary | ICD-10-CM | POA: Diagnosis not present

## 2022-07-14 DIAGNOSIS — Z20828 Contact with and (suspected) exposure to other viral communicable diseases: Secondary | ICD-10-CM | POA: Diagnosis not present

## 2022-07-14 DIAGNOSIS — Z6824 Body mass index (BMI) 24.0-24.9, adult: Secondary | ICD-10-CM | POA: Diagnosis not present

## 2022-07-29 DIAGNOSIS — Z6824 Body mass index (BMI) 24.0-24.9, adult: Secondary | ICD-10-CM | POA: Diagnosis not present

## 2022-07-29 DIAGNOSIS — L0390 Cellulitis, unspecified: Secondary | ICD-10-CM | POA: Diagnosis not present

## 2022-07-29 DIAGNOSIS — R030 Elevated blood-pressure reading, without diagnosis of hypertension: Secondary | ICD-10-CM | POA: Diagnosis not present

## 2022-08-08 DIAGNOSIS — Z23 Encounter for immunization: Secondary | ICD-10-CM | POA: Diagnosis not present

## 2022-08-26 DIAGNOSIS — Z23 Encounter for immunization: Secondary | ICD-10-CM | POA: Diagnosis not present

## 2022-10-02 ENCOUNTER — Encounter: Payer: Self-pay | Admitting: Radiology

## 2022-10-13 DIAGNOSIS — H33321 Round hole, right eye: Secondary | ICD-10-CM | POA: Diagnosis not present

## 2022-10-13 DIAGNOSIS — H43813 Vitreous degeneration, bilateral: Secondary | ICD-10-CM | POA: Diagnosis not present

## 2022-10-30 DIAGNOSIS — E559 Vitamin D deficiency, unspecified: Secondary | ICD-10-CM | POA: Diagnosis not present

## 2022-10-30 DIAGNOSIS — I10 Essential (primary) hypertension: Secondary | ICD-10-CM | POA: Diagnosis not present

## 2022-10-30 DIAGNOSIS — E039 Hypothyroidism, unspecified: Secondary | ICD-10-CM | POA: Diagnosis not present

## 2022-10-30 DIAGNOSIS — E7849 Other hyperlipidemia: Secondary | ICD-10-CM | POA: Diagnosis not present

## 2022-10-30 DIAGNOSIS — Z87891 Personal history of nicotine dependence: Secondary | ICD-10-CM | POA: Diagnosis not present

## 2022-10-30 DIAGNOSIS — E7801 Familial hypercholesterolemia: Secondary | ICD-10-CM | POA: Diagnosis not present

## 2022-11-06 DIAGNOSIS — R011 Cardiac murmur, unspecified: Secondary | ICD-10-CM | POA: Diagnosis not present

## 2022-11-06 DIAGNOSIS — M069 Rheumatoid arthritis, unspecified: Secondary | ICD-10-CM | POA: Diagnosis not present

## 2022-11-06 DIAGNOSIS — I8390 Asymptomatic varicose veins of unspecified lower extremity: Secondary | ICD-10-CM | POA: Diagnosis not present

## 2022-11-06 DIAGNOSIS — R030 Elevated blood-pressure reading, without diagnosis of hypertension: Secondary | ICD-10-CM | POA: Diagnosis not present

## 2022-11-06 DIAGNOSIS — F330 Major depressive disorder, recurrent, mild: Secondary | ICD-10-CM | POA: Diagnosis not present

## 2022-11-06 DIAGNOSIS — E7849 Other hyperlipidemia: Secondary | ICD-10-CM | POA: Diagnosis not present

## 2022-11-06 DIAGNOSIS — E039 Hypothyroidism, unspecified: Secondary | ICD-10-CM | POA: Diagnosis not present

## 2022-11-06 DIAGNOSIS — G43909 Migraine, unspecified, not intractable, without status migrainosus: Secondary | ICD-10-CM | POA: Diagnosis not present

## 2022-11-06 DIAGNOSIS — M5430 Sciatica, unspecified side: Secondary | ICD-10-CM | POA: Diagnosis not present

## 2022-11-06 DIAGNOSIS — E559 Vitamin D deficiency, unspecified: Secondary | ICD-10-CM | POA: Diagnosis not present

## 2022-11-06 DIAGNOSIS — K2100 Gastro-esophageal reflux disease with esophagitis, without bleeding: Secondary | ICD-10-CM | POA: Diagnosis not present

## 2022-11-06 DIAGNOSIS — M810 Age-related osteoporosis without current pathological fracture: Secondary | ICD-10-CM | POA: Diagnosis not present

## 2022-11-24 DIAGNOSIS — M1990 Unspecified osteoarthritis, unspecified site: Secondary | ICD-10-CM | POA: Diagnosis not present

## 2022-11-24 DIAGNOSIS — Z6824 Body mass index (BMI) 24.0-24.9, adult: Secondary | ICD-10-CM | POA: Diagnosis not present

## 2022-11-24 DIAGNOSIS — M5430 Sciatica, unspecified side: Secondary | ICD-10-CM | POA: Diagnosis not present

## 2022-11-24 DIAGNOSIS — M545 Low back pain, unspecified: Secondary | ICD-10-CM | POA: Diagnosis not present

## 2022-11-24 DIAGNOSIS — M810 Age-related osteoporosis without current pathological fracture: Secondary | ICD-10-CM | POA: Diagnosis not present

## 2022-11-24 DIAGNOSIS — M069 Rheumatoid arthritis, unspecified: Secondary | ICD-10-CM | POA: Diagnosis not present

## 2022-11-26 NOTE — Progress Notes (Signed)
Office Visit Note  Patient: Megan Daniel             Date of Birth: 11-04-1935           MRN: [id number]             PCP: Estanislado Pandy, MD Referring: Estanislado Pandy, MD Visit Date: 11/28/2022 Occupation: @GUAROCC @  Subjective:  Pain in multiple joints  History of Present Illness: Megan Daniel is a 87 y.o. female with history of osteoarthritis.  She states she continues to have pain and discomfort in her bilateral hands.  She has not noticed joint swelling.  She does a lot of yard work which causes discomfort in her hands.  She has been followed by Dr. Tiburcio Pea for her lower back pain.  She also describes pain in her lower back which sometimes radiate to her right lower extremity.  She plans to see Dr. Romeo Apple in the near future.  She is going to her dentist for dental work.  She states she will need a lot of dental work.  She stopped Fosamax about a year ago due to GI side effects.    Activities of Daily Living:  Patient reports morning stiffness for 1 hour.   Patient Denies nocturnal pain.  Difficulty dressing/grooming: Reports Difficulty climbing stairs: Reports Difficulty getting out of chair: Reports Difficulty using hands for taps, buttons, cutlery, and/or writing: Reports  Review of Systems  Constitutional:  Positive for fatigue.  HENT:  Negative for mouth sores and mouth dryness.   Eyes:  Negative for dryness.  Respiratory:  Negative for shortness of breath.   Cardiovascular:  Positive for irregular heartbeat. Negative for chest pain and palpitations.  Gastrointestinal:  Negative for blood in stool, constipation and diarrhea.  Endocrine: Negative for increased urination.  Genitourinary:  Positive for involuntary urination.  Musculoskeletal:  Positive for joint pain, gait problem, joint pain, joint swelling, myalgias, muscle weakness, morning stiffness, muscle tenderness and myalgias.  Skin:  Negative for color change, rash, hair loss and sensitivity to  sunlight.  Allergic/Immunologic: Negative for susceptible to infections.  Neurological:  Positive for dizziness, numbness and memory loss. Negative for headaches.  Hematological:  Negative for swollen glands.  Psychiatric/Behavioral:  Negative for depressed mood and sleep disturbance. The patient is not nervous/anxious.     PMFS History:  Patient Active Problem List   Diagnosis Date Noted   Hypertension 03/21/2021   Thyroid disease 03/21/2021   Arthritis 03/21/2021   Dysphagia 05/03/2019   Idiopathic small fiber peripheral neuropathy 12/31/2017   Carpal tunnel syndrome, left upper limb 11/19/2017   Idiopathic polyneuropathy 11/19/2017   Varicose veins of left lower extremity with complications 07/08/2016   Headache(784.0) 04/08/2014   Joint pain 04/08/2014   Other malaise and fatigue 04/08/2014   Neuropathy 04/08/2014   Achalasia 04/03/2014   Personal history of colonic polyps 04/03/2014   GERD (gastroesophageal reflux disease) 04/03/2014    Past Medical History:  Diagnosis Date   Abdominal pain    Allergic rhinitis    Anxiety 10/03/2013   Arthralgia 03/31/2014   Bronchitis 09/09/2013   Cellulitis    Diverticulitis    Diverticulosis    Fatigue    GERD (gastroesophageal reflux disease)    Gout    Headache    Headache    Hyperchloremia    Hypercholesterolemia    Hypertension    Hypothyroidism    Lumbar strain    Major depressive disorder    Migraine    Osteoporosis  Rheumatoid arthritis (HCC)    Scoliosis    Sleep apnea     Family History  Problem Relation Age of Onset   Heart disease Mother    Alzheimer's disease Father    Alzheimer's disease Sister    Macular degeneration Sister    Arthritis Sister    Arthritis Sister    Seizures Sister    Alcohol abuse Sister    COPD Sister    Healthy Brother    Seizures Other        nephew   Liver cancer Other    Past Surgical History:  Procedure Laterality Date   CARPAL TUNNEL RELEASE     25 years ago    CARPAL TUNNEL RELEASE Left 05/17/2021   Procedure: CARPAL TUNNEL RELEASE;  Surgeon: Vickki Hearing, MD;  Location: [facility];  Service: Orthopedics;  Laterality: Left;  pt knows to arrive at 10:45   CHOLECYSTECTOMY     20 yrs ago   COLONOSCOPY     COLONOSCOPY N/A 10/25/2014   Procedure: COLONOSCOPY;  Surgeon: Malissa Hippo, MD;  [facility]: [facility];  Service: Endoscopy;  Laterality: N/A;  830   ESOPHAGEAL DILATION N/A 06/17/2019   Procedure: ESOPHAGEAL DILATION;  Surgeon: Malissa Hippo, MD;  [facility]: [facility];  Service: Endoscopy;  Laterality: N/A;   ESOPHAGOGASTRODUODENOSCOPY N/A 10/25/2014   Procedure: ESOPHAGOGASTRODUODENOSCOPY (EGD);  Surgeon: Malissa Hippo, MD;  [facility];  Service: Endoscopy;  Laterality: N/A;   ESOPHAGOGASTRODUODENOSCOPY (EGD) WITH PROPOFOL N/A 06/17/2019   Procedure: ESOPHAGOGASTRODUODENOSCOPY (EGD) WITH PROPOFOL;  Surgeon: Malissa Hippo, MD;  [facility];  Service: Endoscopy;  Laterality: N/A;  130pm   HEMORROIDECTOMY     2005   POLYPECTOMY     TOTAL ABDOMINAL HYSTERECTOMY     Age 89   UPPER GASTROINTESTINAL ENDOSCOPY     Social History   Social History Narrative   Patient is divorced, no children.   Patient is a retired Designer, jewellery.   She lives at home alone, she has a friend who stays with her some   Patient is right handed.   Patient drinks 2 cups coffee daily.   Immunization History  Administered Date(s) Administered   Influenza-Unspecified 03/04/2018     Objective: Vital Signs: BP 129/70 (BP Location: Left Arm, Patient Position: Sitting, Cuff Size: Normal)   Pulse (!) 54   Resp 13   Ht 5\' 6"  (1.676 m)   Wt 137 lb (62.1 kg)   BMI 22.11 kg/m    Physical Exam Vitals and nursing note reviewed.  Constitutional:      Appearance: She is well-developed.  HENT:     Head: Normocephalic and atraumatic.  Eyes:     Conjunctiva/sclera: Conjunctivae normal.  Cardiovascular:     Rate and Rhythm:  Normal rate and regular rhythm.     Heart sounds: Normal heart sounds.  Pulmonary:     Effort: Pulmonary effort is normal.     Breath sounds: Normal breath sounds.  Abdominal:     General: Bowel sounds are normal.     Palpations: Abdomen is soft.  Musculoskeletal:     Cervical back: Normal range of motion.  Lymphadenopathy:     Cervical: No cervical adenopathy.  Skin:    General: Skin is warm and dry.     Capillary Refill: Capillary refill takes less than 2 seconds.  Neurological:     Mental Status: She is alert and oriented to person, place, and time.  Psychiatric:  Behavior: Behavior normal.      Musculoskeletal Exam: She had limited lateral rotation of the spine without discomfort.  She had painful range of motion of the lumbar spine.  She had tenderness over the right paravertebral region.  Shoulder joints, elbow joints, wrist joints, MCPs PIPs and DIPs with good range of motion.  No synovitis was noted.  She had bilateral PIP and DIP thickening.  Bilateral CMC thickening and subluxation was noted.  Hip joints and knee joints in good range of motion.  She had no tenderness over ankles or MTPs.  CDAI Exam: CDAI Score: -- Patient Global: --; Provider Global: -- Swollen: --; Tender: -- Joint Exam 11/28/2022   No joint exam has been documented for this visit   There is currently no information documented on the homunculus. Go to the Rheumatology activity and complete the homunculus joint exam.  Investigation: No additional findings.  Imaging: No results found.  Recent Labs: Lab Results  Component Value Date   WBC 7.0 05/01/2021   HGB 12.7 05/01/2021   PLT 158 05/01/2021   NA 142 12/20/2021   K 4.9 12/20/2021   CL 107 12/20/2021   CO2 26 12/20/2021   GLUCOSE 81 12/20/2021   BUN 24 12/20/2021   CREATININE 0.67 12/20/2021   BILITOT 0.5 12/20/2021   ALKPHOS 85 06/05/2020   AST 18 12/20/2021   ALT 13 12/20/2021   PROT 6.6 12/20/2021   ALBUMIN 4.7 (H)  06/05/2020   CALCIUM 9.8 12/20/2021   GFRAA 87 06/05/2020    Speciality Comments: No specialty comments available.  Procedures:  No procedures performed Allergies: Dexmedetomidine, Ibuprofen, Zyrtec [cetirizine], and Flexeril [cyclobenzaprine]   Assessment / Plan:     Visit Diagnoses: Pain in both hands - Treated with intermittent prednisone by Dr. Kellie Simmering.  She had low titer positive rheumatoid factor in 2015.  X-rays showed possible inflammatory OA versus crystal induced arthropathy.  Patient used to get intermittent prednisone by Dr. Kellie Simmering.  Now she has been on meloxicam 7.5 mg p.o. daily.  I discouraged the use of meloxicam due to GI side effects, hepatic and renal toxicity.  Joint protection muscle strengthening was advised.  I advised her to contact us if she develops any joint swelling.  We may consider ultrasound of bilateral hands if needed.  Rheumatoid factor positive - RF negative and anti-CCP negative now  Carpal tunnel syndrome, left upper limb - May 17, 2021 by Dr. Romeo Apple.  She continues to have some numbness in her left hand.  Primary osteoarthritis of both feet-she complains of discomfort in her feet off-and-on.  No synovitis was noted.  Proper fitting shoes were advised.  DDD (degenerative disc disease), lumbar - She had x-rays by Dr. Romeo Apple on March 21, 2021.  She continues to have lower back pain with right-sided radiculopathy.  I offered referral to physical therapy.  Patient stated that she will see Dr. Romeo Apple and take his advised.  History of gout - Patient had only 1 episode with no recurrence.  She is currently not taking any medications.  Age-related osteoporosis without current pathological fracture - Alendronate 70 mg p.o. weekly started in February 2022.  Patient states that she stopped Fosamax about a year ago due to GI side effects.  Patient was experiencing brief flux and she has esophageal occlusion.  We discussed the Reclast IV infusions at the  last visit.  Patient is still getting dental work.  I advised her to finish dental work and get a repeat DEXA scan.  I advised her to bring a copy of the DEXA scan at the follow-up visit so we can discuss treatment plan in the future.  Idiopathic small fiber peripheral neuropathy - Followed by Dr. Lucia Gaskins  Primary hypertension-blood pressure was 129/70 today.  History of gastroesophageal reflux (GERD)  History of diverticulitis  Personal history of colonic polyps  Varicose veins of left lower extremity with complications  Anxiety and depression - Increased depression due to loss of her sister  History of migraine  Former smoker - Quit smoking in 2018.  She smoked half a pack per day for 20 years.  Thyroid disease  Orders: No orders of the defined types were placed in this encounter.  No orders of the defined types were placed in this encounter.    Follow-Up Instructions: Return in about 6 months (around 05/30/2023) for Osteoarthritis, Gout, Osteoporosis.   Pollyann Savoy, MD  Note - This record has been created using Animal nutritionist.  Chart creation errors have been sought, but may not always  have been located. Such creation errors do not reflect on  the standard of medical care.

## 2022-11-28 ENCOUNTER — Ambulatory Visit: Payer: Medicare Other | Attending: Rheumatology | Admitting: Rheumatology

## 2022-11-28 ENCOUNTER — Telehealth: Payer: Self-pay | Admitting: Rheumatology

## 2022-11-28 ENCOUNTER — Encounter: Payer: Self-pay | Admitting: Rheumatology

## 2022-11-28 VITALS — BP 129/70 | HR 54 | Resp 13 | Ht 66.0 in | Wt 137.0 lb

## 2022-11-28 DIAGNOSIS — G5602 Carpal tunnel syndrome, left upper limb: Secondary | ICD-10-CM | POA: Diagnosis not present

## 2022-11-28 DIAGNOSIS — F419 Anxiety disorder, unspecified: Secondary | ICD-10-CM | POA: Diagnosis not present

## 2022-11-28 DIAGNOSIS — E079 Disorder of thyroid, unspecified: Secondary | ICD-10-CM | POA: Diagnosis not present

## 2022-11-28 DIAGNOSIS — R768 Other specified abnormal immunological findings in serum: Secondary | ICD-10-CM | POA: Insufficient documentation

## 2022-11-28 DIAGNOSIS — R296 Repeated falls: Secondary | ICD-10-CM | POA: Diagnosis not present

## 2022-11-28 DIAGNOSIS — Z87891 Personal history of nicotine dependence: Secondary | ICD-10-CM | POA: Diagnosis not present

## 2022-11-28 DIAGNOSIS — M19071 Primary osteoarthritis, right ankle and foot: Secondary | ICD-10-CM | POA: Diagnosis not present

## 2022-11-28 DIAGNOSIS — F32A Depression, unspecified: Secondary | ICD-10-CM | POA: Diagnosis not present

## 2022-11-28 DIAGNOSIS — Z8739 Personal history of other diseases of the musculoskeletal system and connective tissue: Secondary | ICD-10-CM | POA: Diagnosis not present

## 2022-11-28 DIAGNOSIS — M79642 Pain in left hand: Secondary | ICD-10-CM | POA: Diagnosis not present

## 2022-11-28 DIAGNOSIS — M810 Age-related osteoporosis without current pathological fracture: Secondary | ICD-10-CM

## 2022-11-28 DIAGNOSIS — M19072 Primary osteoarthritis, left ankle and foot: Secondary | ICD-10-CM

## 2022-11-28 DIAGNOSIS — Z860100 Personal history of colon polyps, unspecified: Secondary | ICD-10-CM

## 2022-11-28 DIAGNOSIS — Z8669 Personal history of other diseases of the nervous system and sense organs: Secondary | ICD-10-CM

## 2022-11-28 DIAGNOSIS — I83892 Varicose veins of left lower extremities with other complications: Secondary | ICD-10-CM

## 2022-11-28 DIAGNOSIS — M79641 Pain in right hand: Secondary | ICD-10-CM

## 2022-11-28 DIAGNOSIS — I10 Essential (primary) hypertension: Secondary | ICD-10-CM | POA: Diagnosis not present

## 2022-11-28 DIAGNOSIS — G609 Hereditary and idiopathic neuropathy, unspecified: Secondary | ICD-10-CM

## 2022-11-28 DIAGNOSIS — Z8719 Personal history of other diseases of the digestive system: Secondary | ICD-10-CM

## 2022-11-28 DIAGNOSIS — R7689 Other specified abnormal immunological findings in serum: Secondary | ICD-10-CM

## 2022-11-28 DIAGNOSIS — M5136 Other intervertebral disc degeneration, lumbar region: Secondary | ICD-10-CM | POA: Diagnosis not present

## 2022-11-28 DIAGNOSIS — M51369 Other intervertebral disc degeneration, lumbar region without mention of lumbar back pain or lower extremity pain: Secondary | ICD-10-CM

## 2022-11-28 NOTE — Telephone Encounter (Signed)
Opened in error

## 2022-11-28 NOTE — Patient Instructions (Signed)
Please get the DEXA scan resolved from 2024 at your next follow-up appointment.  You should complete your dental work prior to your next appointment.

## 2022-12-13 DIAGNOSIS — S40861A Insect bite (nonvenomous) of right upper arm, initial encounter: Secondary | ICD-10-CM | POA: Diagnosis not present

## 2022-12-13 DIAGNOSIS — T63481A Toxic effect of venom of other arthropod, accidental (unintentional), initial encounter: Secondary | ICD-10-CM | POA: Diagnosis not present

## 2022-12-13 DIAGNOSIS — Z6823 Body mass index (BMI) 23.0-23.9, adult: Secondary | ICD-10-CM | POA: Diagnosis not present

## 2022-12-25 DIAGNOSIS — F419 Anxiety disorder, unspecified: Secondary | ICD-10-CM | POA: Diagnosis not present

## 2022-12-25 DIAGNOSIS — R030 Elevated blood-pressure reading, without diagnosis of hypertension: Secondary | ICD-10-CM | POA: Diagnosis not present

## 2022-12-25 DIAGNOSIS — R413 Other amnesia: Secondary | ICD-10-CM | POA: Diagnosis not present

## 2022-12-25 DIAGNOSIS — E559 Vitamin D deficiency, unspecified: Secondary | ICD-10-CM | POA: Diagnosis not present

## 2022-12-25 DIAGNOSIS — Z6823 Body mass index (BMI) 23.0-23.9, adult: Secondary | ICD-10-CM | POA: Diagnosis not present

## 2022-12-25 DIAGNOSIS — E039 Hypothyroidism, unspecified: Secondary | ICD-10-CM | POA: Diagnosis not present

## 2022-12-25 DIAGNOSIS — R5383 Other fatigue: Secondary | ICD-10-CM | POA: Diagnosis not present

## 2023-01-20 ENCOUNTER — Ambulatory Visit: Payer: Medicare Other | Admitting: Rheumatology

## 2023-02-04 DIAGNOSIS — R5383 Other fatigue: Secondary | ICD-10-CM | POA: Diagnosis not present

## 2023-02-04 DIAGNOSIS — R413 Other amnesia: Secondary | ICD-10-CM | POA: Diagnosis not present

## 2023-02-04 DIAGNOSIS — R519 Headache, unspecified: Secondary | ICD-10-CM | POA: Diagnosis not present

## 2023-02-04 DIAGNOSIS — Z8673 Personal history of transient ischemic attack (TIA), and cerebral infarction without residual deficits: Secondary | ICD-10-CM | POA: Diagnosis not present

## 2023-02-04 DIAGNOSIS — R9089 Other abnormal findings on diagnostic imaging of central nervous system: Secondary | ICD-10-CM | POA: Diagnosis not present

## 2023-02-04 DIAGNOSIS — G43009 Migraine without aura, not intractable, without status migrainosus: Secondary | ICD-10-CM | POA: Diagnosis not present

## 2023-02-10 DIAGNOSIS — R413 Other amnesia: Secondary | ICD-10-CM | POA: Diagnosis not present

## 2023-02-10 DIAGNOSIS — H53411 Scotoma involving central area, right eye: Secondary | ICD-10-CM | POA: Diagnosis not present

## 2023-02-10 DIAGNOSIS — G43909 Migraine, unspecified, not intractable, without status migrainosus: Secondary | ICD-10-CM | POA: Diagnosis not present

## 2023-02-10 DIAGNOSIS — R030 Elevated blood-pressure reading, without diagnosis of hypertension: Secondary | ICD-10-CM | POA: Diagnosis not present

## 2023-02-10 DIAGNOSIS — Z6823 Body mass index (BMI) 23.0-23.9, adult: Secondary | ICD-10-CM | POA: Diagnosis not present

## 2023-03-04 DIAGNOSIS — E039 Hypothyroidism, unspecified: Secondary | ICD-10-CM | POA: Diagnosis not present

## 2023-03-04 DIAGNOSIS — E782 Mixed hyperlipidemia: Secondary | ICD-10-CM | POA: Diagnosis not present

## 2023-03-04 DIAGNOSIS — I10 Essential (primary) hypertension: Secondary | ICD-10-CM | POA: Diagnosis not present

## 2023-03-16 DIAGNOSIS — R531 Weakness: Secondary | ICD-10-CM | POA: Diagnosis not present

## 2023-03-16 DIAGNOSIS — R030 Elevated blood-pressure reading, without diagnosis of hypertension: Secondary | ICD-10-CM | POA: Diagnosis not present

## 2023-03-16 DIAGNOSIS — F330 Major depressive disorder, recurrent, mild: Secondary | ICD-10-CM | POA: Diagnosis not present

## 2023-03-16 DIAGNOSIS — R296 Repeated falls: Secondary | ICD-10-CM | POA: Diagnosis not present

## 2023-03-16 DIAGNOSIS — Z6823 Body mass index (BMI) 23.0-23.9, adult: Secondary | ICD-10-CM | POA: Diagnosis not present

## 2023-03-16 DIAGNOSIS — M2550 Pain in unspecified joint: Secondary | ICD-10-CM | POA: Diagnosis not present

## 2023-03-16 DIAGNOSIS — R413 Other amnesia: Secondary | ICD-10-CM | POA: Diagnosis not present

## 2023-03-17 DIAGNOSIS — R296 Repeated falls: Secondary | ICD-10-CM | POA: Diagnosis not present

## 2023-03-17 DIAGNOSIS — M2550 Pain in unspecified joint: Secondary | ICD-10-CM | POA: Diagnosis not present

## 2023-03-17 DIAGNOSIS — R413 Other amnesia: Secondary | ICD-10-CM | POA: Diagnosis not present

## 2023-03-17 DIAGNOSIS — R531 Weakness: Secondary | ICD-10-CM | POA: Diagnosis not present

## 2023-03-17 DIAGNOSIS — F330 Major depressive disorder, recurrent, mild: Secondary | ICD-10-CM | POA: Diagnosis not present

## 2023-03-17 DIAGNOSIS — Z6823 Body mass index (BMI) 23.0-23.9, adult: Secondary | ICD-10-CM | POA: Diagnosis not present

## 2023-03-23 DIAGNOSIS — F0390 Unspecified dementia without behavioral disturbance: Secondary | ICD-10-CM | POA: Diagnosis not present

## 2023-03-23 DIAGNOSIS — K5790 Diverticulosis of intestine, part unspecified, without perforation or abscess without bleeding: Secondary | ICD-10-CM | POA: Diagnosis not present

## 2023-03-23 DIAGNOSIS — M25521 Pain in right elbow: Secondary | ICD-10-CM | POA: Diagnosis not present

## 2023-03-23 DIAGNOSIS — R413 Other amnesia: Secondary | ICD-10-CM | POA: Diagnosis not present

## 2023-03-23 DIAGNOSIS — I10 Essential (primary) hypertension: Secondary | ICD-10-CM | POA: Diagnosis not present

## 2023-03-23 DIAGNOSIS — Z9181 History of falling: Secondary | ICD-10-CM | POA: Diagnosis not present

## 2023-03-23 DIAGNOSIS — E7800 Pure hypercholesterolemia, unspecified: Secondary | ICD-10-CM | POA: Diagnosis not present

## 2023-03-23 DIAGNOSIS — M2550 Pain in unspecified joint: Secondary | ICD-10-CM | POA: Diagnosis not present

## 2023-03-23 DIAGNOSIS — J309 Allergic rhinitis, unspecified: Secondary | ICD-10-CM | POA: Diagnosis not present

## 2023-03-23 DIAGNOSIS — R531 Weakness: Secondary | ICD-10-CM | POA: Diagnosis not present

## 2023-03-23 DIAGNOSIS — M810 Age-related osteoporosis without current pathological fracture: Secondary | ICD-10-CM | POA: Diagnosis not present

## 2023-03-23 DIAGNOSIS — F330 Major depressive disorder, recurrent, mild: Secondary | ICD-10-CM | POA: Diagnosis not present

## 2023-05-07 DIAGNOSIS — H353131 Nonexudative age-related macular degeneration, bilateral, early dry stage: Secondary | ICD-10-CM | POA: Diagnosis not present

## 2023-05-07 DIAGNOSIS — Z961 Presence of intraocular lens: Secondary | ICD-10-CM | POA: Diagnosis not present

## 2023-05-07 DIAGNOSIS — H43813 Vitreous degeneration, bilateral: Secondary | ICD-10-CM | POA: Diagnosis not present

## 2023-05-20 NOTE — Progress Notes (Deleted)
Office Visit Note  Patient:  Megan Daniel             Date of Birth: Mar 02, 1936           MRN: [id number]             PCP: Estanislado Pandy, MD Referring: Estanislado Pandy, MD Visit Date: 06/02/2023 Occupation: @GUAROCC @  Subjective:  No chief complaint on file.   History of Present Illness: Megan Daniel is a 87 y.o. female ***     Activities of Daily Living:  Patient reports morning stiffness for *** {minute/hour:19697}.   Patient {ACTIONS;DENIES/REPORTS:21021675::"Denies"} nocturnal pain.  Difficulty dressing/grooming: {ACTIONS;DENIES/REPORTS:21021675::"Denies"} Difficulty climbing stairs: {ACTIONS;DENIES/REPORTS:21021675::"Denies"} Difficulty getting out of chair: {ACTIONS;DENIES/REPORTS:21021675::"Denies"} Difficulty using hands for taps, buttons, cutlery, and/or writing: {ACTIONS;DENIES/REPORTS:21021675::"Denies"}  No Rheumatology ROS completed.   PMFS History:  Patient Active Problem List   Diagnosis Date Noted   Hypertension 03/21/2021   Thyroid disease 03/21/2021   Arthritis 03/21/2021   Dysphagia 05/03/2019   Idiopathic small fiber peripheral neuropathy 12/31/2017   Carpal tunnel syndrome, left upper limb 11/19/2017   Idiopathic polyneuropathy 11/19/2017   Varicose veins of left lower extremity with complications 07/08/2016   Headache 04/08/2014   Joint pain 04/08/2014   Other malaise and fatigue 04/08/2014   Neuropathy 04/08/2014   Achalasia 04/03/2014   History of colonic polyps 04/03/2014   GERD (gastroesophageal reflux disease) 04/03/2014    Past Medical History:  Diagnosis Date   Abdominal pain    Allergic rhinitis    Anxiety 10/03/2013   Arthralgia 03/31/2014   Bronchitis 09/09/2013   Cellulitis    Diverticulitis    Diverticulosis    Fatigue    GERD (gastroesophageal reflux disease)    Gout    Headache    Headache    Hyperchloremia    Hypercholesterolemia    Hypertension    Hypothyroidism    Lumbar strain    Major depressive  disorder    Migraine    Osteoporosis    Rheumatoid arthritis (HCC)    Scoliosis    Sleep apnea     Family History  Problem Relation Age of Onset   Heart disease Mother    Alzheimer's disease Father    Alzheimer's disease Sister    Macular degeneration Sister    Arthritis Sister    Arthritis Sister    Seizures Sister    Alcohol abuse Sister    COPD Sister    Healthy Brother    Seizures Other        nephew   Liver cancer Other    Past Surgical History:  Procedure Laterality Date   CARPAL TUNNEL RELEASE     25 years ago   CARPAL TUNNEL RELEASE Left 05/17/2021   Procedure: CARPAL TUNNEL RELEASE;  Surgeon: Vickki Hearing, MD;  Location: [facility];  Service: Orthopedics;  Laterality: Left;  pt knows to arrive at 10:45   CHOLECYSTECTOMY     20 yrs ago   COLONOSCOPY     COLONOSCOPY N/A 10/25/2014   Procedure: COLONOSCOPY;  Surgeon: Malissa Hippo, MD;  [facility];  Service: Endoscopy;  Laterality: N/A;  830   ESOPHAGEAL DILATION N/A 06/17/2019   Procedure: ESOPHAGEAL DILATION;  Surgeon: Malissa Hippo, MD;  [facility];  Service: Endoscopy;  Laterality: N/A;   ESOPHAGOGASTRODUODENOSCOPY N/A 10/25/2014   Procedure: ESOPHAGOGASTRODUODENOSCOPY (EGD);  Surgeon: Malissa Hippo, MD;  [facility];  Service: Endoscopy;  Laterality: N/A;   ESOPHAGOGASTRODUODENOSCOPY (EGD)  WITH PROPOFOL N/A 06/17/2019   Procedure: ESOPHAGOGASTRODUODENOSCOPY (EGD) WITH PROPOFOL;  Surgeon: Malissa Hippo, MD;  [facility];  Service: Endoscopy;  Laterality: N/A;  130pm   HEMORROIDECTOMY     2005   POLYPECTOMY     TOTAL ABDOMINAL HYSTERECTOMY     Age 89   UPPER GASTROINTESTINAL ENDOSCOPY     Social History   Social History Narrative   Patient is divorced, no children.   Patient is a retired Designer, jewellery.   She lives at home alone, she has a friend who stays with her some   Patient is right handed.   Patient drinks 2 cups coffee daily.    Immunization History  Administered Date(s) Administered   Influenza-Unspecified 03/04/2018     Objective: Vital Signs: There were no vitals taken for this visit.   Physical Exam   Musculoskeletal Exam: ***  CDAI Exam: CDAI Score: -- Patient Global: --; Provider Global: -- Swollen: --; Tender: -- Joint Exam 06/02/2023   No joint exam has been documented for this visit   There is currently no information documented on the homunculus. Go to the Rheumatology activity and complete the homunculus joint exam.  Investigation: No additional findings.  Imaging: No results found.  Recent Labs: Lab Results  Component Value Date   WBC 7.0 05/01/2021   HGB 12.7 05/01/2021   PLT 158 05/01/2021   NA 142 12/20/2021   K 4.9 12/20/2021   CL 107 12/20/2021   CO2 26 12/20/2021   GLUCOSE 81 12/20/2021   BUN 24 12/20/2021   CREATININE 0.67 12/20/2021   BILITOT 0.5 12/20/2021   ALKPHOS 85 06/05/2020   AST 18 12/20/2021   ALT 13 12/20/2021   PROT 6.6 12/20/2021   ALBUMIN 4.7 (H) 06/05/2020   CALCIUM 9.8 12/20/2021   GFRAA 87 06/05/2020    Speciality Comments: No specialty comments available.  Procedures:  No procedures performed Allergies: Dexmedetomidine, Ibuprofen, Zyrtec [cetirizine], and Flexeril [cyclobenzaprine]   Assessment / Plan:     Visit Diagnoses: No diagnosis found.  Orders: No orders of the defined types were placed in this encounter.  No orders of the defined types were placed in this encounter.   Face-to-face time spent with patient was *** minutes. Greater than 50% of time was spent in counseling and coordination of care.  Follow-Up Instructions: No follow-ups on file.   Ellen Henri, CMA  Note - This record has been created using Animal nutritionist.  Chart creation errors have been sought, but may not always  have been located. Such creation errors do not reflect on  the standard of medical care.

## 2023-06-02 ENCOUNTER — Ambulatory Visit: Payer: Medicare Other | Admitting: Rheumatology

## 2023-06-02 DIAGNOSIS — F32A Depression, unspecified: Secondary | ICD-10-CM

## 2023-06-02 DIAGNOSIS — G609 Hereditary and idiopathic neuropathy, unspecified: Secondary | ICD-10-CM

## 2023-06-02 DIAGNOSIS — Z8739 Personal history of other diseases of the musculoskeletal system and connective tissue: Secondary | ICD-10-CM

## 2023-06-02 DIAGNOSIS — M810 Age-related osteoporosis without current pathological fracture: Secondary | ICD-10-CM

## 2023-06-02 DIAGNOSIS — G5602 Carpal tunnel syndrome, left upper limb: Secondary | ICD-10-CM

## 2023-06-02 DIAGNOSIS — Z8719 Personal history of other diseases of the digestive system: Secondary | ICD-10-CM

## 2023-06-02 DIAGNOSIS — Z87891 Personal history of nicotine dependence: Secondary | ICD-10-CM

## 2023-06-02 DIAGNOSIS — M19071 Primary osteoarthritis, right ankle and foot: Secondary | ICD-10-CM

## 2023-06-02 DIAGNOSIS — Z8669 Personal history of other diseases of the nervous system and sense organs: Secondary | ICD-10-CM

## 2023-06-02 DIAGNOSIS — E079 Disorder of thyroid, unspecified: Secondary | ICD-10-CM

## 2023-06-02 DIAGNOSIS — M79641 Pain in right hand: Secondary | ICD-10-CM

## 2023-06-02 DIAGNOSIS — R768 Other specified abnormal immunological findings in serum: Secondary | ICD-10-CM

## 2023-06-02 DIAGNOSIS — I83892 Varicose veins of left lower extremities with other complications: Secondary | ICD-10-CM

## 2023-06-02 DIAGNOSIS — M47816 Spondylosis without myelopathy or radiculopathy, lumbar region: Secondary | ICD-10-CM

## 2023-06-02 DIAGNOSIS — I10 Essential (primary) hypertension: Secondary | ICD-10-CM

## 2023-06-02 DIAGNOSIS — Z860100 Personal history of colon polyps, unspecified: Secondary | ICD-10-CM

## 2023-06-03 DIAGNOSIS — M2550 Pain in unspecified joint: Secondary | ICD-10-CM | POA: Diagnosis not present

## 2023-06-03 DIAGNOSIS — R531 Weakness: Secondary | ICD-10-CM | POA: Diagnosis not present

## 2023-06-03 DIAGNOSIS — F330 Major depressive disorder, recurrent, mild: Secondary | ICD-10-CM | POA: Diagnosis not present

## 2023-06-03 DIAGNOSIS — M25521 Pain in right elbow: Secondary | ICD-10-CM | POA: Diagnosis not present

## 2023-06-03 DIAGNOSIS — R413 Other amnesia: Secondary | ICD-10-CM | POA: Diagnosis not present

## 2023-06-03 DIAGNOSIS — F0390 Unspecified dementia without behavioral disturbance: Secondary | ICD-10-CM | POA: Diagnosis not present

## 2023-06-03 DIAGNOSIS — M810 Age-related osteoporosis without current pathological fracture: Secondary | ICD-10-CM | POA: Diagnosis not present

## 2023-06-03 DIAGNOSIS — I10 Essential (primary) hypertension: Secondary | ICD-10-CM | POA: Diagnosis not present

## 2023-06-10 DIAGNOSIS — Z961 Presence of intraocular lens: Secondary | ICD-10-CM | POA: Diagnosis not present

## 2023-06-10 DIAGNOSIS — H353131 Nonexudative age-related macular degeneration, bilateral, early dry stage: Secondary | ICD-10-CM | POA: Diagnosis not present

## 2023-06-10 DIAGNOSIS — H43813 Vitreous degeneration, bilateral: Secondary | ICD-10-CM | POA: Diagnosis not present

## 2023-06-10 DIAGNOSIS — H34212 Partial retinal artery occlusion, left eye: Secondary | ICD-10-CM | POA: Diagnosis not present

## 2023-06-12 ENCOUNTER — Ambulatory Visit (INDEPENDENT_AMBULATORY_CARE_PROVIDER_SITE_OTHER): Payer: Medicare Other

## 2023-06-12 ENCOUNTER — Encounter: Payer: Self-pay | Admitting: Cardiovascular Disease

## 2023-06-12 ENCOUNTER — Ambulatory Visit: Payer: Medicare Other | Attending: Cardiovascular Disease | Admitting: Cardiovascular Disease

## 2023-06-12 VITALS — BP 122/60 | HR 56 | Ht 65.0 in | Wt 125.4 lb

## 2023-06-12 DIAGNOSIS — H349 Unspecified retinal vascular occlusion: Secondary | ICD-10-CM | POA: Insufficient documentation

## 2023-06-12 DIAGNOSIS — I150 Renovascular hypertension: Secondary | ICD-10-CM | POA: Diagnosis not present

## 2023-06-12 DIAGNOSIS — R002 Palpitations: Secondary | ICD-10-CM | POA: Insufficient documentation

## 2023-06-12 DIAGNOSIS — E782 Mixed hyperlipidemia: Secondary | ICD-10-CM

## 2023-06-12 MED ORDER — ATORVASTATIN CALCIUM 40 MG PO TABS: 40.0000 mg | ORAL_TABLET | Freq: Every day | ORAL | 3 refills | Status: DC

## 2023-06-12 NOTE — Progress Notes (Unsigned)
Enrolled patient for a 14 day Zio XT  monitor to be mailed to patients home  °

## 2023-06-12 NOTE — Patient Instructions (Signed)
Medication Instructions:  Start Lipitor 40 mg daily. Start Aspirin 81 mg. Stop meloxicam.  *If you need a refill on your cardiac medications before your next appointment, please call your pharmacy*    Testing/Procedures:  ZIO XT- Long Term Monitor Instructions   Your physician has requested you wear your ZIO patch monitor____14___days.   This is a single patch monitor.  [employer] supplies one patch monitor per enrollment.  Additional stickers are not available.   Please do not apply patch if you will be having a Nuclear Stress Test, Echocardiogram, Cardiac CT, MRI, or Chest Xray during the time frame you would be wearing the monitor. The patch cannot be worn during these tests.  You cannot remove and re-apply the ZIO XT patch monitor.   Your ZIO patch monitor will be sent USPS Priority mail from [facility] directly to your home address. The monitor may also be mailed to a PO BOX if home delivery is not available.   It may take 3-5 days to receive your monitor after you have been enrolled.   Once you have received you monitor, please review enclosed instructions.  Your monitor has already been registered assigning a specific monitor serial # to you.   Applying the monitor   Shave hair from upper left chest.   Hold abrader disc by orange tab.  Rub abrader in 40 strokes over left upper chest as indicated in your monitor instructions.   Clean area with 4 enclosed alcohol pads .  Use all pads to assure are is cleaned thoroughly.  Let dry.   Apply patch as indicated in monitor instructions.  Patch will be place under collarbone on left side of chest with arrow pointing upward.   Rub patch adhesive wings for 2 minutes.Remove white label marked "1".  Remove white label marked "2".  Rub patch adhesive wings for 2 additional minutes.   While looking in a mirror, press and release button in center of patch.  A small green light will flash 3-4 times .  This will be your only indicator the  monitor has been turned on.     Do not shower for the first 24 hours.  You may shower after the first 24 hours.   Press button if you feel a symptom. You will hear a small click.  Record Date, Time and Symptom in the Patient Log Book.   When you are ready to remove patch, follow instructions on last 2 pages of Patient Log Book.  Stick patch monitor onto last page of Patient Log Book.   Place Patient Log Book in [place] box.  Use locking tab on box and tape box closed securely.  The [employer] has [employer] on it.  Please place in mailbox as soon as possible.  Your physician should have your test results approximately 7 days after the monitor has been mailed back to [facility].   Call [facility] Customer Care at [phone] if you have questions regarding your ZIO XT patch monitor.  Call them immediately if you see an orange light blinking on your monitor.   If your monitor falls off in less than 4 days contact our Monitor department at [phone].  If your monitor becomes loose or falls off after 4 days call [employer] at [phone] for suggestions on securing your monitor.   Your physician has requested that you have an echocardiogram. Echocardiography is a painless test that uses sound waves to create images of your heart. It provides your doctor  with information about the size and shape of your heart and how well your heart's chambers and valves are working. This procedure takes approximately one hour. There are no restrictions for this procedure. [address] Please do NOT wear cologne, perfume, aftershave, or lotions (deodorant is allowed). [address] Please arrive 15 minutes prior to your appointment time.  Please note: We ask at that you not bring children with you during ultrasound (echo/ vascular) testing. Due to room size and safety concerns, children are not allowed in the ultrasound rooms during exams. Our front office staff cannot provide  observation of children in our lobby area while testing is being conducted. An adult accompanying a patient to their appointment will only be allowed in the ultrasound room at the discretion of the ultrasound technician under special circumstances. We apologize for any inconvenience.  Your physician has requested that you have a carotid duplex. This test is an ultrasound of the carotid arteries in your neck. It looks at blood flow through these arteries that supply the brain with blood. Allow one hour for this exam. There are no restrictions or special instructions.   Follow-Up: At [facility], you and your health needs are our priority.  As part of our continuing mission to provide you with exceptional heart care, we have created designated Provider Care Teams.  These Care Teams include your primary Cardiologist (physician) and Advanced Practice Providers (APPs -  Physician Assistants and Nurse Practitioners) who all work together to provide you with the care you need, when you need it.  We recommend signing up for the patient portal called "MyChart".  Sign up information is provided on this After Visit Summary.  MyChart is used to connect with patients for Virtual Visits (Telemedicine).  Patients are able to view lab/test results, encounter notes, upcoming appointments, etc.  Non-urgent messages can be sent to your provider as well.   To learn more about what you can do with MyChart, go to [email or web address].    Your next appointment:   6 month(s)  Provider:   Lennie Odor MD Other Instructions Referral to Neurology

## 2023-06-12 NOTE — Progress Notes (Signed)
Cardiology Office Note:  .   Date:  06/12/2023  ID:  Megan Daniel, DOB [birth year], MRN [id number] PCP: Estanislado Pandy, MD  [facility] Providers Cardiologist:  None { History of Present Illness: Megan Daniel Kitchen   Megan Daniel is a 87 y.o. female with history of HTN who presents for the evaluation of retinal artery occlusion at the request of Sasser, Clarene Critchley, MD.  HGB 11.6 PLT 133 Cr 0.83 TSH 3.42 T chol 228, HDL 77, LDL 146, TG  66   History of Present Illness   Megan Daniel, an 87 year old female with a history of hypertension, migraines, and neuropathy, presents for evaluation of a recent diagnosis of retinal artery occlusion. The diagnosis was made by her ophthalmologist during a routine visit for new glasses. The patient has a history of falling, with three falls occurring in one weekend. She also reports a history of chronic migraines, which were diagnosed by a neurologist. The patient was previously on verapamil for blood pressure control and migraine management. She also has a history of carpal tunnel syndrome in both arms and has been diagnosed with scoliosis and three types of arthritis. She takes Mobic for arthritis pain relief. The patient is a retired Engineer, civil (consulting) and has been losing weight. She has a history of slightly elevated cholesterol, which she managed with Lipitor in the past. She has a history of smoking and alcohol use but quit both many years ago. She has never had a heart attack or stroke.          Problem List Retinal artery occlusion  HTN Cerebrovascular microvascular ischemia     ROS: All other ROS reviewed and negative. Pertinent positives noted in the HPI.     Studies Reviewed: Megan Daniel Kitchen   EKG Interpretation Date/Time:  Friday June 12 2023 14:21:28 EST Ventricular Rate:  52 PR Interval:  148 QRS Duration:  72 QT Interval:  424 QTC Calculation: 394 R Axis:   31  Text Interpretation: Sinus bradycardia with sinus arrhythmia Possible Anterior infarct , age  undetermined Confirmed by Lennie Odor ([id number]) on 06/12/2023 2:34:28 PM   Physical Exam:   VS:  BP 122/60 (BP Location: Left Arm, Patient Position: Sitting, Cuff Size: Normal)   Pulse (!) 56   Ht 5\' 5"  (1.651 m)   Wt 125 lb 6.4 oz (56.9 kg)   SpO2 98%   BMI 20.87 kg/m    Wt Readings from Last 3 Encounters:  06/12/23 125 lb 6.4 oz (56.9 kg)  11/28/22 137 lb (62.1 kg)  01/15/22 134 lb (60.8 kg)    GEN: Well nourished, well developed in no acute distress NECK: No JVD; No carotid bruits CARDIAC: RRR, no murmurs, rubs, gallops RESPIRATORY:  Clear to auscultation without rales, wheezing or rhonchi  ABDOMEN: Soft, non-tender, non-distended EXTREMITIES:  No edema; No deformity  ASSESSMENT AND PLAN: .   Assessment and Plan    Retinal Artery Occlusion Partial occlusion of a retinal artery noted by ophthalmology, likely due to plaque. No evidence of stroke or clot. Concern for potential embolic source. -EKG normal.  -Order echocardiogram, carotid ultrasounds, and a 2-week Zio monitor to evaluate for arrhythmias. -Start baby aspirin daily and statin.  -Refer to neurology for further evaluation.  Hyperlipidemia Slightly elevated cholesterol noted last year, patient has history of taking Lipitor. -Start Lipitor 40mg  daily.  Arthritis Patient on Meloxicam for arthritis pain, but has been causing heartburn. -Recommend limiting Meloxicam use and switching to Tylenol for pain management.  Follow-up in 6 months  after completion of tests and neurology evaluation.              Follow-up: Return in about 6 months (around 12/10/2023).   Signed, Lenna Gilford. Flora Lipps, MD, [facility]  [facility] [address]  [address], [address] [place], [place] [address] [phone]  2:52 PM \

## 2023-06-19 DIAGNOSIS — R002 Palpitations: Secondary | ICD-10-CM | POA: Diagnosis not present

## 2023-06-19 DIAGNOSIS — I150 Renovascular hypertension: Secondary | ICD-10-CM | POA: Diagnosis not present

## 2023-06-19 DIAGNOSIS — H349 Unspecified retinal vascular occlusion: Secondary | ICD-10-CM | POA: Diagnosis not present

## 2023-06-19 DIAGNOSIS — E782 Mixed hyperlipidemia: Secondary | ICD-10-CM

## 2023-06-25 ENCOUNTER — Ambulatory Visit: Payer: Medicare Other | Attending: Cardiovascular Disease

## 2023-06-25 DIAGNOSIS — I150 Renovascular hypertension: Secondary | ICD-10-CM | POA: Insufficient documentation

## 2023-06-25 DIAGNOSIS — E782 Mixed hyperlipidemia: Secondary | ICD-10-CM | POA: Insufficient documentation

## 2023-06-25 DIAGNOSIS — H349 Unspecified retinal vascular occlusion: Secondary | ICD-10-CM | POA: Insufficient documentation

## 2023-06-29 LAB — ECHOCARDIOGRAM COMPLETE
AR max vel: 1.99 cm2
AV Area VTI: 2.01 cm2
AV Area mean vel: 2.19 cm2
AV Mean grad: 7 mm[Hg]
AV Peak grad: 13.7 mm[Hg]
Ao pk vel: 1.85 m/s
Area-P 1/2: 2.94 cm2
Calc EF: 63 %
MV VTI: 2.02 cm2
S' Lateral: 2.1 cm
Single Plane A2C EF: 69.5 %
Single Plane A4C EF: 55.1 %

## 2023-06-30 ENCOUNTER — Ambulatory Visit (HOSPITAL_COMMUNITY)
Admission: RE | Admit: 2023-06-30 | Discharge: 2023-06-30 | Disposition: A | Discharge status: Home / Self Care | Payer: Medicare Other | Source: Ambulatory Visit | Attending: Cardiovascular Disease | Admitting: Cardiovascular Disease

## 2023-06-30 DIAGNOSIS — I150 Renovascular hypertension: Secondary | ICD-10-CM | POA: Diagnosis not present

## 2023-06-30 DIAGNOSIS — E782 Mixed hyperlipidemia: Secondary | ICD-10-CM | POA: Insufficient documentation

## 2023-06-30 DIAGNOSIS — H349 Unspecified retinal vascular occlusion: Secondary | ICD-10-CM | POA: Diagnosis not present

## 2023-07-03 DIAGNOSIS — F3342 Major depressive disorder, recurrent, in full remission: Secondary | ICD-10-CM | POA: Diagnosis not present

## 2023-07-03 DIAGNOSIS — R413 Other amnesia: Secondary | ICD-10-CM | POA: Diagnosis not present

## 2023-07-03 DIAGNOSIS — E782 Mixed hyperlipidemia: Secondary | ICD-10-CM | POA: Diagnosis not present

## 2023-07-08 DIAGNOSIS — A150 Tuberculosis of lung: Secondary | ICD-10-CM | POA: Diagnosis not present

## 2023-07-17 DIAGNOSIS — R002 Palpitations: Secondary | ICD-10-CM | POA: Diagnosis not present

## 2023-07-17 DIAGNOSIS — H349 Unspecified retinal vascular occlusion: Secondary | ICD-10-CM | POA: Diagnosis not present

## 2023-07-20 DIAGNOSIS — N390 Urinary tract infection, site not specified: Secondary | ICD-10-CM | POA: Diagnosis not present

## 2023-07-20 DIAGNOSIS — Z87440 Personal history of urinary (tract) infections: Secondary | ICD-10-CM | POA: Diagnosis not present

## 2023-07-27 DIAGNOSIS — E782 Mixed hyperlipidemia: Secondary | ICD-10-CM | POA: Diagnosis not present

## 2023-07-27 DIAGNOSIS — F331 Major depressive disorder, recurrent, moderate: Secondary | ICD-10-CM | POA: Diagnosis not present

## 2023-07-27 DIAGNOSIS — F4320 Adjustment disorder, unspecified: Secondary | ICD-10-CM | POA: Diagnosis not present

## 2023-07-27 DIAGNOSIS — I119 Hypertensive heart disease without heart failure: Secondary | ICD-10-CM | POA: Diagnosis not present

## 2023-07-27 DIAGNOSIS — E039 Hypothyroidism, unspecified: Secondary | ICD-10-CM | POA: Diagnosis not present

## 2023-07-27 DIAGNOSIS — F0390 Unspecified dementia without behavioral disturbance: Secondary | ICD-10-CM | POA: Diagnosis not present

## 2023-07-27 DIAGNOSIS — F411 Generalized anxiety disorder: Secondary | ICD-10-CM | POA: Diagnosis not present

## 2023-07-27 DIAGNOSIS — J309 Allergic rhinitis, unspecified: Secondary | ICD-10-CM | POA: Diagnosis not present

## 2023-07-27 DIAGNOSIS — F330 Major depressive disorder, recurrent, mild: Secondary | ICD-10-CM | POA: Diagnosis not present

## 2023-07-27 DIAGNOSIS — N390 Urinary tract infection, site not specified: Secondary | ICD-10-CM | POA: Diagnosis not present

## 2023-07-28 ENCOUNTER — Other Ambulatory Visit: Payer: Self-pay

## 2023-07-28 ENCOUNTER — Emergency Department (HOSPITAL_COMMUNITY)
Admission: EM | Admit: 2023-07-28 | Discharge: 2023-07-29 | Disposition: A | Discharge status: Rehabilitation Facility | Payer: Medicare Other | Attending: Emergency Medicine | Admitting: Emergency Medicine

## 2023-07-28 ENCOUNTER — Encounter (HOSPITAL_COMMUNITY): Payer: Self-pay

## 2023-07-28 DIAGNOSIS — F03B0 Unspecified dementia, moderate, without behavioral disturbance, psychotic disturbance, mood disturbance, and anxiety: Secondary | ICD-10-CM | POA: Diagnosis not present

## 2023-07-28 DIAGNOSIS — F0390 Unspecified dementia without behavioral disturbance: Secondary | ICD-10-CM | POA: Diagnosis not present

## 2023-07-28 DIAGNOSIS — R4182 Altered mental status, unspecified: Secondary | ICD-10-CM | POA: Diagnosis not present

## 2023-07-28 DIAGNOSIS — I10 Essential (primary) hypertension: Secondary | ICD-10-CM | POA: Diagnosis not present

## 2023-07-28 DIAGNOSIS — Z79899 Other long term (current) drug therapy: Secondary | ICD-10-CM | POA: Diagnosis not present

## 2023-07-28 DIAGNOSIS — D696 Thrombocytopenia, unspecified: Secondary | ICD-10-CM

## 2023-07-28 DIAGNOSIS — I700 Atherosclerosis of aorta: Secondary | ICD-10-CM | POA: Diagnosis not present

## 2023-07-28 DIAGNOSIS — G9389 Other specified disorders of brain: Secondary | ICD-10-CM | POA: Diagnosis not present

## 2023-07-28 DIAGNOSIS — I6782 Cerebral ischemia: Secondary | ICD-10-CM | POA: Diagnosis not present

## 2023-07-28 NOTE — ED Provider Notes (Signed)
[place] EMERGENCY DEPARTMENT AT [facility] Provider Note   CSN: [id number] Arrival date & time: 07/28/23  2337     History {Add pertinent medical, surgical, social history, OB history to HPI:1} No chief complaint on file.   Megan Daniel is a 87 y.o. female.  The history is provided by the patient, the nursing home and the EMS personnel.   She has history of hypertension, hyperlipidemia, dementia and was sent from a skilled nursing facility because of concern of altered mental status.  Patient states that she tried to leave the facility because she did not like it and she was going to write a letter to President Trump.    Home Medications Prior to Admission medications   Medication Sig Start Date End Date Taking? Authorizing Provider  acetaminophen (TYLENOL) 500 MG tablet Take 1,000 mg by mouth every 6 (six) hours as needed for mild pain or moderate pain.    [provider]  atorvastatin (LIPITOR) 40 MG tablet Take 1 tablet (40 mg total) by mouth daily. 06/12/23 09/10/23  Sande Rives, MD  Cyanocobalamin (VITAMIN B-12 PO) Take 1,000 mcg by mouth daily. Patient not taking: Reported on 06/12/2023    [provider]  famotidine (PEPCID) 40 MG tablet TAKE 1 TABLET BY MOUTH AT BEDTIME Patient taking differently: Take 40 mg by mouth daily as needed for indigestion. 01/01/21   Malissa Hippo, MD  fexofenadine (ALLEGRA ALLERGY) 180 MG tablet Take 180 mg by mouth daily.    [provider]  Boris Lown Oil 500 MG CAPS Take 500 mg by mouth daily. Patient not taking: Reported on 06/12/2023    [provider]  levothyroxine (SYNTHROID, LEVOTHROID) 100 MCG tablet Take 100 mcg by mouth daily before breakfast.    [provider]  Melatonin 10 MG TABS Take by mouth at bedtime.    [provider]  Multiple Minerals-Vitamins (CAL MAG ZINC +D3) TABS Take 1 tablet by mouth daily. Patient not taking: Reported on 06/12/2023     [provider]  Multiple Vitamins-Minerals (ABC COMPLETE SENIOR WOMENS 50+ PO) Take 1 capsule by mouth daily with breakfast.    [provider]  Multiple Vitamins-Minerals (PRESERVISION/LUTEIN PO) Take 1 tablet by mouth 2 (two) times daily. Patient not taking: Reported on 06/12/2023    [provider]  Omega-3 1400 MG CAPS Take 1,400 mg by mouth daily. Patient not taking: Reported on 06/12/2023    [provider]  Probiotic Product (PROBIOTIC DAILY PO) Take 1 tablet by mouth daily.    [provider]  sertraline (ZOLOFT) 50 MG tablet Take 50 mg by mouth daily. 12/25/21   [provider]  traMADol (ULTRAM) 50 MG tablet Take 1 tablet (50 mg total) by mouth every 6 (six) hours as needed. Patient taking differently: Take 50 mg by mouth every 6 (six) hours as needed for moderate pain (pain score 4-6). 02/12/15   Anson Fret, MD  triamcinolone cream (KENALOG) 0.1 % Apply 1 application. topically daily as needed (In ear after shower).    [provider]  verapamil (CALAN-SR) 180 MG CR tablet Take 180 mg by mouth daily. 06/04/23   [provider]  verapamil (CALAN-SR) 240 MG CR tablet Take 240 mg by mouth daily. Patient not taking: Reported on 06/12/2023 06/07/19   [provider]      Allergies    Dexmedetomidine, Ibuprofen, Zyrtec [cetirizine], and Flexeril [cyclobenzaprine]    Review of Systems   Review of Systems  All other systems reviewed and are negative.   Physical Exam Updated Vital Signs There were no vitals taken for this visit. Physical Exam Vitals and nursing note reviewed.   87 year old female, resting comfortably and in no acute distress. Vital signs are ***. Oxygen saturation is ***%, which is normal. Head is normocephalic and atraumatic. PERRLA, EOMI. Oropharynx is clear. Neck is nontender and supple without adenopathy or JVD. Back is nontender and there is no CVA tenderness. Lungs are clear  without rales, wheezes, or rhonchi. Chest is nontender. Heart has regular rate and rhythm with 2/6 holosystolic murmur best heard along the left sternal border. Abdomen is soft, flat, nontender without masses or hepatosplenomegaly and peristalsis is normoactive. Extremities have no cyanosis or edema, full range of motion is present. Skin is warm and dry without rash. Neurologic: Awake and alert, oriented x 3.  Cranial nerves are intact.  Moves all extremities equally.  ED Results / Procedures / Treatments   Labs (all labs ordered are listed, but only abnormal results are displayed) Labs Reviewed - No data to display  EKG None  Radiology No results found.  Procedures Procedures  Cardiac monitor shows normal sinus rhythm, per my interpretation.  Medications Ordered in ED Medications - No data to display  ED Course/ Medical Decision Making/ A&P   {   Click here for ABCD2, HEART and other calculatorsREFRESH Note before signing :1}                              Medical Decision Making  Altered mental status.  She needs to be evaluated for possible occult infection, electrolyte disturbance.  I have ordered workup of CBC, comprehensive metabolic panel, urinalysis and I have ordered chest x-ray and CT of head.  {Document critical care time when appropriate:1} {Document review of labs and clinical decision tools ie heart score, Chads2Vasc2 etc:1}  {Document your independent review of radiology images, and any outside records:1} {Document your discussion with family members, caretakers, and with consultants:1} {Document social determinants of health affecting pt's care:1} {Document your decision making why or why not admission, treatments were needed:1} Final Clinical Impression(s) / ED Diagnoses Final diagnoses:  None    Rx / DC Orders ED Discharge Orders     None

## 2023-07-28 NOTE — ED Triage Notes (Signed)
Pt arrived via [facility] from [facility] in [place] for medical evaluation. Staff called 911 because they felt the Pt had AMS. Pt reports she does not like [facility] and presents in NAD with no complaints.

## 2023-07-29 ENCOUNTER — Emergency Department (HOSPITAL_COMMUNITY): Payer: Medicare Other

## 2023-07-29 DIAGNOSIS — G9389 Other specified disorders of brain: Secondary | ICD-10-CM | POA: Diagnosis not present

## 2023-07-29 DIAGNOSIS — F0390 Unspecified dementia without behavioral disturbance: Secondary | ICD-10-CM | POA: Diagnosis not present

## 2023-07-29 DIAGNOSIS — I10 Essential (primary) hypertension: Secondary | ICD-10-CM | POA: Diagnosis not present

## 2023-07-29 DIAGNOSIS — Z7401 Bed confinement status: Secondary | ICD-10-CM | POA: Diagnosis not present

## 2023-07-29 DIAGNOSIS — I6782 Cerebral ischemia: Secondary | ICD-10-CM | POA: Diagnosis not present

## 2023-07-29 DIAGNOSIS — I700 Atherosclerosis of aorta: Secondary | ICD-10-CM | POA: Diagnosis not present

## 2023-07-29 DIAGNOSIS — R4182 Altered mental status, unspecified: Secondary | ICD-10-CM | POA: Diagnosis not present

## 2023-07-29 LAB — CBC WITH DIFFERENTIAL/PLATELET
Abs Immature Granulocytes: 0.02 10*3/uL (ref 0.00–0.07)
Basophils Absolute: 0.1 10*3/uL (ref 0.0–0.1)
Basophils Relative: 1 %
Eosinophils Absolute: 0.1 10*3/uL (ref 0.0–0.5)
Eosinophils Relative: 1 %
HCT: 38.4 % (ref 36.0–46.0)
Hemoglobin: 12.4 g/dL (ref 12.0–15.0)
Immature Granulocytes: 0 %
Lymphocytes Relative: 55 %
Lymphs Abs: 6.2 10*3/uL — ABNORMAL HIGH (ref 0.7–4.0)
MCH: 30.5 pg (ref 26.0–34.0)
MCHC: 32.3 g/dL (ref 30.0–36.0)
MCV: 94.3 fL (ref 80.0–100.0)
Monocytes Absolute: 0.5 10*3/uL (ref 0.1–1.0)
Monocytes Relative: 4 %
Neutro Abs: 4.4 10*3/uL (ref 1.7–7.7)
Neutrophils Relative %: 39 %
Platelets: 145 10*3/uL — ABNORMAL LOW (ref 150–400)
RBC: 4.07 MIL/uL (ref 3.87–5.11)
RDW: 13.2 % (ref 11.5–15.5)
WBC: 11.2 10*3/uL — ABNORMAL HIGH (ref 4.0–10.5)
nRBC: 0 % (ref 0.0–0.2)

## 2023-07-29 LAB — URINALYSIS, W/ REFLEX TO CULTURE (INFECTION SUSPECTED)
Bilirubin Urine: NEGATIVE
Glucose, UA: NEGATIVE mg/dL
Hgb urine dipstick: NEGATIVE
Ketones, ur: NEGATIVE mg/dL
Nitrite: NEGATIVE
Protein, ur: 30 mg/dL — AB
Specific Gravity, Urine: 1.024 (ref 1.005–1.030)
pH: 5 (ref 5.0–8.0)

## 2023-07-29 LAB — COMPREHENSIVE METABOLIC PANEL
ALT: 24 U/L (ref 0–44)
AST: 27 U/L (ref 15–41)
Albumin: 4 g/dL (ref 3.5–5.0)
Alkaline Phosphatase: 69 U/L (ref 38–126)
Anion gap: 9 (ref 5–15)
BUN: 17 mg/dL (ref 8–23)
CO2: 26 mmol/L (ref 22–32)
Calcium: 9.9 mg/dL (ref 8.9–10.3)
Chloride: 105 mmol/L (ref 98–111)
Creatinine, Ser: 0.75 mg/dL (ref 0.44–1.00)
GFR, Estimated: >60 mL/min (ref >60)
Glucose, Bld: 96 mg/dL (ref 70–99)
Potassium: 4.4 mmol/L (ref 3.5–5.1)
Sodium: 140 mmol/L (ref 135–145)
Total Bilirubin: 0.6 mg/dL (ref <1.2)
Total Protein: 6.8 g/dL (ref 6.5–8.1)

## 2023-07-29 LAB — ETHANOL: Alcohol, Ethyl (B): <10 mg/dL (ref <10)

## 2023-07-29 NOTE — ED Notes (Signed)
Patient transported to CT 

## 2023-07-29 NOTE — ED Notes (Signed)
Pt is on the transportation list back to [place].

## 2023-07-29 NOTE — Discharge Instructions (Addendum)
You need to stay at your place in [place].

## 2023-07-30 DIAGNOSIS — F0390 Unspecified dementia without behavioral disturbance: Secondary | ICD-10-CM | POA: Diagnosis not present

## 2023-07-30 DIAGNOSIS — F331 Major depressive disorder, recurrent, moderate: Secondary | ICD-10-CM | POA: Diagnosis not present

## 2023-07-30 DIAGNOSIS — F411 Generalized anxiety disorder: Secondary | ICD-10-CM | POA: Diagnosis not present

## 2023-07-30 LAB — URINE CULTURE: Culture: <10000 — AB

## 2023-07-31 DIAGNOSIS — R0989 Other specified symptoms and signs involving the circulatory and respiratory systems: Secondary | ICD-10-CM | POA: Diagnosis not present

## 2023-07-31 DIAGNOSIS — R0602 Shortness of breath: Secondary | ICD-10-CM | POA: Diagnosis not present

## 2023-08-03 DIAGNOSIS — F22 Delusional disorders: Secondary | ICD-10-CM | POA: Diagnosis not present

## 2023-08-03 DIAGNOSIS — U071 COVID-19: Secondary | ICD-10-CM | POA: Diagnosis not present

## 2023-08-05 ENCOUNTER — Emergency Department (HOSPITAL_COMMUNITY)
Admission: EM | Admit: 2023-08-05 | Discharge: 2023-08-06 | Disposition: A | Discharge status: Home / Self Care | Payer: Medicare Other | Attending: Emergency Medicine | Admitting: Emergency Medicine

## 2023-08-05 ENCOUNTER — Other Ambulatory Visit: Payer: Self-pay

## 2023-08-05 ENCOUNTER — Encounter (HOSPITAL_COMMUNITY): Payer: Self-pay

## 2023-08-05 DIAGNOSIS — F29 Unspecified psychosis not due to a substance or known physiological condition: Secondary | ICD-10-CM | POA: Diagnosis not present

## 2023-08-05 DIAGNOSIS — Z79899 Other long term (current) drug therapy: Secondary | ICD-10-CM | POA: Insufficient documentation

## 2023-08-05 DIAGNOSIS — F0390 Unspecified dementia without behavioral disturbance: Secondary | ICD-10-CM | POA: Insufficient documentation

## 2023-08-05 DIAGNOSIS — R451 Restlessness and agitation: Secondary | ICD-10-CM | POA: Diagnosis present

## 2023-08-05 DIAGNOSIS — I959 Hypotension, unspecified: Secondary | ICD-10-CM | POA: Diagnosis not present

## 2023-08-05 DIAGNOSIS — R001 Bradycardia, unspecified: Secondary | ICD-10-CM | POA: Diagnosis not present

## 2023-08-05 DIAGNOSIS — U071 COVID-19: Secondary | ICD-10-CM | POA: Insufficient documentation

## 2023-08-05 DIAGNOSIS — Z046 Encounter for general psychiatric examination, requested by authority: Secondary | ICD-10-CM | POA: Diagnosis not present

## 2023-08-05 DIAGNOSIS — F03911 Unspecified dementia, unspecified severity, with agitation: Secondary | ICD-10-CM | POA: Diagnosis not present

## 2023-08-05 DIAGNOSIS — M25522 Pain in left elbow: Secondary | ICD-10-CM | POA: Insufficient documentation

## 2023-08-05 DIAGNOSIS — I10 Essential (primary) hypertension: Secondary | ICD-10-CM | POA: Insufficient documentation

## 2023-08-05 LAB — CBC WITH DIFFERENTIAL/PLATELET
Abs Immature Granulocytes: 0.03 10*3/uL (ref 0.00–0.07)
Basophils Absolute: 0.1 10*3/uL (ref 0.0–0.1)
Basophils Relative: 1 %
Eosinophils Absolute: 0.1 10*3/uL (ref 0.0–0.5)
Eosinophils Relative: 1 %
HCT: 38 % (ref 36.0–46.0)
Hemoglobin: 12.3 g/dL (ref 12.0–15.0)
Immature Granulocytes: 0 %
Lymphocytes Relative: 51 %
Lymphs Abs: 5.3 10*3/uL — ABNORMAL HIGH (ref 0.7–4.0)
MCH: 30.6 pg (ref 26.0–34.0)
MCHC: 32.4 g/dL (ref 30.0–36.0)
MCV: 94.5 fL (ref 80.0–100.0)
Monocytes Absolute: 0.7 10*3/uL (ref 0.1–1.0)
Monocytes Relative: 7 %
Neutro Abs: 4.1 10*3/uL (ref 1.7–7.7)
Neutrophils Relative %: 40 %
Platelets: 175 10*3/uL (ref 150–400)
RBC: 4.02 MIL/uL (ref 3.87–5.11)
RDW: 13.1 % (ref 11.5–15.5)
WBC: 10.3 10*3/uL (ref 4.0–10.5)
nRBC: 0 % (ref 0.0–0.2)

## 2023-08-05 LAB — COMPREHENSIVE METABOLIC PANEL
ALT: 17 U/L (ref 0–44)
AST: 20 U/L (ref 15–41)
Albumin: 3.7 g/dL (ref 3.5–5.0)
Alkaline Phosphatase: 76 U/L (ref 38–126)
Anion gap: 8 (ref 5–15)
BUN: 21 mg/dL (ref 8–23)
CO2: 25 mmol/L (ref 22–32)
Calcium: 9.7 mg/dL (ref 8.9–10.3)
Chloride: 106 mmol/L (ref 98–111)
Creatinine, Ser: 0.67 mg/dL (ref 0.44–1.00)
GFR, Estimated: >60 mL/min (ref >60)
Glucose, Bld: 84 mg/dL (ref 70–99)
Potassium: 3.4 mmol/L — ABNORMAL LOW (ref 3.5–5.1)
Sodium: 139 mmol/L (ref 135–145)
Total Bilirubin: 0.7 mg/dL (ref 0.0–1.2)
Total Protein: 7 g/dL (ref 6.5–8.1)

## 2023-08-05 LAB — ETHANOL: Alcohol, Ethyl (B): <10 mg/dL (ref <10)

## 2023-08-05 LAB — SARS CORONAVIRUS 2 BY RT PCR: SARS Coronavirus 2 by RT PCR: POSITIVE — AB

## 2023-08-05 MED ORDER — VERAPAMIL HCL ER 180 MG PO TBCR: 180.0000 mg | EXTENDED_RELEASE_TABLET | Freq: Every day | ORAL | Status: DC

## 2023-08-05 MED ORDER — HYDROCHLOROTHIAZIDE 25 MG PO TABS
12.5000 mg | ORAL_TABLET | Freq: Every day | ORAL | Status: DC
  Administered 2023-08-05 – 2023-08-06 (×2): 12.5 mg via ORAL
  Filled 2023-08-05 (×2): qty 1

## 2023-08-05 MED ORDER — VERAPAMIL HCL ER 240 MG PO TBCR
240.0000 mg | EXTENDED_RELEASE_TABLET | Freq: Every day | ORAL | Status: DC
  Administered 2023-08-05: 240 mg via ORAL
  Filled 2023-08-05 (×4): qty 1

## 2023-08-05 MED ORDER — MOLNUPIRAVIR 200 MG PO CAPS: 4.0000 | ORAL_CAPSULE | Freq: Two times a day (BID) | ORAL | Status: DC

## 2023-08-05 MED ORDER — MELATONIN 3 MG PO TABS
9.0000 mg | ORAL_TABLET | Freq: Every day | ORAL | Status: DC
  Administered 2023-08-05: 9 mg via ORAL
  Filled 2023-08-05: qty 3

## 2023-08-05 MED ORDER — LEVOTHYROXINE SODIUM 50 MCG PO TABS
100.0000 ug | ORAL_TABLET | Freq: Every day | ORAL | Status: DC
  Administered 2023-08-06: 100 ug via ORAL
  Filled 2023-08-05: qty 2

## 2023-08-05 MED ORDER — VITAMIN B-12 1000 MCG PO TABS
1000.0000 ug | ORAL_TABLET | Freq: Every day | ORAL | Status: DC
  Administered 2023-08-05 – 2023-08-06 (×2): 1000 ug via ORAL
  Filled 2023-08-05 (×2): qty 1

## 2023-08-05 MED ORDER — TRAMADOL HCL 50 MG PO TABS: 50.0000 mg | ORAL_TABLET | Freq: Four times a day (QID) | ORAL | Status: DC | PRN

## 2023-08-05 MED ORDER — FAMOTIDINE 20 MG PO TABS
40.0000 mg | ORAL_TABLET | Freq: Every day | ORAL | Status: DC
  Administered 2023-08-05: 40 mg via ORAL
  Filled 2023-08-05: qty 2

## 2023-08-05 MED ORDER — SERTRALINE HCL 50 MG PO TABS
50.0000 mg | ORAL_TABLET | Freq: Every day | ORAL | Status: DC
  Administered 2023-08-05 – 2023-08-06 (×2): 50 mg via ORAL
  Filled 2023-08-05 (×2): qty 1

## 2023-08-05 MED ORDER — ATORVASTATIN CALCIUM 40 MG PO TABS
40.0000 mg | ORAL_TABLET | Freq: Every day | ORAL | Status: DC
  Administered 2023-08-05 – 2023-08-06 (×2): 40 mg via ORAL
  Filled 2023-08-05 (×2): qty 1

## 2023-08-05 MED ORDER — DONEPEZIL HCL 5 MG PO TABS
5.0000 mg | ORAL_TABLET | Freq: Every day | ORAL | Status: DC
  Administered 2023-08-05 – 2023-08-06 (×2): 5 mg via ORAL
  Filled 2023-08-05 (×2): qty 1

## 2023-08-05 NOTE — ED Triage Notes (Signed)
 Pt bib [facility] from [facility]. Facility states she needs a psych evaluation because she tried to escape the facility, has been refusing meds. Pt states she did try to escape because they treat her like a criminal and she doesn't like being treated that way.

## 2023-08-05 NOTE — ED Provider Notes (Signed)
 [place] EMERGENCY DEPARTMENT AT [facility] Provider Note   CSN: [id number] Arrival date & time: 08/05/23  1439     History  Chief Complaint  Patient presents with   Medical Clearance    Megan Daniel is a 88 y.o. female.  With a history of hypertension and early dementia who presents to the ED for agitation.  Patient is a resident of [facility] nursing facility where the staff voiced concern given the patient's attempt to abscond from the facility and refusing her medications.  They sent her in for psychiatric evaluation.  Patient voices her discontent with the staff there stating they are trying to give her the wrong medications.  She has no systemic complaints at this time.  She tells me that she would be open to returning to [place] [facility] so long as her nephew Thermon) who has power of attorney is agreeable with this plan as well.  [name] lives in  .  HPI     Home Medications Prior to Admission medications   Medication Sig Start Date End Date Taking? Authorizing Provider  acetaminophen  (TYLENOL ) 500 MG tablet Take 1,000 mg by mouth every 6 (six) hours as needed for mild pain or moderate pain.    [provider]  atorvastatin  (LIPITOR) 40 MG tablet Take 1 tablet (40 mg total) by mouth daily. 06/12/23 09/10/23  Barbaraann Darryle Ned, MD  Cyanocobalamin  (VITAMIN B-12 PO) Take 1,000 mcg by mouth daily. Patient not taking: Reported on 06/12/2023    [provider]  donepezil  (ARICEPT ) 5 MG tablet Take 5 mg by mouth daily. 07/13/23   [provider]  famotidine  (PEPCID ) 40 MG tablet TAKE 1 TABLET BY MOUTH AT BEDTIME Patient taking differently: Take 40 mg by mouth daily as needed for indigestion. 01/01/21   Golda Claudis PENNER, MD  fexofenadine (ALLEGRA ALLERGY) 180 MG tablet Take 180 mg by mouth daily.    [provider]  hydrochlorothiazide  (HYDRODIURIL ) 12.5 MG tablet Take 12.5 mg by mouth daily.    [provider]  Anselm  Oil 500 MG CAPS Take 500 mg by mouth daily. Patient not taking: Reported on 06/12/2023    [provider]  LAGEVRIO  200 MG CAPS capsule Take 4 capsules by mouth 2 (two) times daily. 07/30/23   [provider]  levothyroxine  (SYNTHROID , LEVOTHROID) 100 MCG tablet Take 100 mcg by mouth daily before breakfast.    [provider]  Melatonin 10 MG TABS Take by mouth at bedtime.    [provider]  Multiple Minerals-Vitamins (CAL MAG ZINC +D3) TABS Take 1 tablet by mouth daily. Patient not taking: Reported on 06/12/2023    [provider]  Multiple Vitamins-Minerals (ABC COMPLETE SENIOR WOMENS 50+ PO) Take 1 capsule by mouth daily with breakfast.    [provider]  Multiple Vitamins-Minerals (PRESERVISION/LUTEIN PO) Take 1 tablet by mouth 2 (two) times daily. Patient not taking: Reported on 06/12/2023    [provider]  nitrofurantoin, macrocrystal-monohydrate, (MACROBID) 100 MG capsule Take 100 mg by mouth 2 (two) times daily. 07/23/23   [provider]  Omega-3 1400 MG CAPS Take 1,400 mg by mouth daily. Patient not taking: Reported on 06/12/2023    [provider]  Probiotic Product (PROBIOTIC DAILY PO) Take 1 tablet by mouth daily.    [provider]  sertraline  (ZOLOFT ) 50 MG tablet Take 50 mg by mouth daily. 12/25/21   [provider]  traMADol  (ULTRAM ) 50 MG tablet Take 1 tablet (50 mg total)  by mouth every 6 (six) hours as needed. Patient taking differently: Take 50 mg by mouth every 6 (six) hours as needed for moderate pain (pain score 4-6). 02/12/15   Ines Onetha NOVAK, MD  triamcinolone cream (KENALOG) 0.1 % Apply 1 application. topically daily as needed (In ear after shower).    [provider]  verapamil  (CALAN -SR) 180 MG CR tablet Take 180 mg by mouth daily. 06/04/23   [provider]  verapamil  (CALAN -SR) 240 MG CR tablet Take 240 mg by mouth daily. Patient not taking: Reported  on 06/12/2023 06/07/19   [provider]      Allergies    Dexmedetomidine, Ibuprofen, Zyrtec [cetirizine], and Flexeril  [cyclobenzaprine ]    Review of Systems   Review of Systems  Physical Exam Updated Vital Signs BP (!) 144/59 (BP Location: Right Arm)   Pulse (!) 56   Temp 98.2 F (36.8 C) (Oral)   Resp 16   Ht 5' 5 (1.651 m)   Wt 56 kg   SpO2 100%   BMI 20.54 kg/m  Physical Exam Vitals and nursing note reviewed.  HENT:     Head: Normocephalic and atraumatic.  Eyes:     Pupils: Pupils are equal, round, and reactive to light.  Cardiovascular:     Rate and Rhythm: Normal rate and regular rhythm.  Pulmonary:     Effort: Pulmonary effort is normal.     Breath sounds: Normal breath sounds.  Abdominal:     Palpations: Abdomen is soft.     Tenderness: There is no abdominal tenderness.  Skin:    General: Skin is warm and dry.  Neurological:     Mental Status: She is alert.  Psychiatric:        Mood and Affect: Mood normal.     ED Results / Procedures / Treatments   Labs (all labs ordered are listed, but only abnormal results are displayed) Labs Reviewed  SARS CORONAVIRUS 2 BY RT PCR - Abnormal; Notable for the following components:      Result Value   SARS Coronavirus 2 by RT PCR POSITIVE (*)    All other components within normal limits  COMPREHENSIVE METABOLIC PANEL - Abnormal; Notable for the following components:   Potassium 3.4 (*)    All other components within normal limits  CBC WITH DIFFERENTIAL/PLATELET - Abnormal; Notable for the following components:   Lymphs Abs 5.3 (*)    All other components within normal limits  ETHANOL  RAPID URINE DRUG SCREEN, HOSP PERFORMED  URINALYSIS, ROUTINE W REFLEX MICROSCOPIC    EKG None  Radiology No results found.  Procedures Procedures    Medications Ordered in ED Medications  atorvastatin  (LIPITOR) tablet 40 mg (40 mg Oral Given 08/05/23 2211)  cyanocobalamin  (VITAMIN B12) tablet 1,000 mcg (1,000  mcg Oral Given 08/05/23 2211)  donepezil  (ARICEPT ) tablet 5 mg (5 mg Oral Given 08/05/23 2304)  famotidine  (PEPCID ) tablet 40 mg (40 mg Oral Given 08/05/23 2212)  hydrochlorothiazide  (HYDRODIURIL ) tablet 12.5 mg (12.5 mg Oral Given 08/05/23 2304)  levothyroxine  (SYNTHROID ) tablet 100 mcg (has no administration in time range)  melatonin tablet 9 mg (9 mg Oral Given 08/05/23 2304)  sertraline  (ZOLOFT ) tablet 50 mg (50 mg Oral Given 08/05/23 2214)  traMADol  (ULTRAM ) tablet 50 mg (has no administration in time range)  verapamil  (CALAN -SR) CR tablet 240 mg (has no administration in time range)    ED Course/ Medical Decision Making/ A&P Clinical Course as of 08/05/23 2307  Wed Aug 05, 2023  2000 COVID-positive.  No respiratory concerns.  Remains neurologically intact.  No other acute findings on laboratory workup.  No leukocytosis.  Awaiting UA  We had spoken with her nursing facility who voiced concern for patient's agitation and are requesting we complete a psychiatric evaluation prior to accepting her back at the facility  Will obtain TTS evaluation in the morning.  Also placed a TOC consult for help with her disposition when the time comes.  Home meds have been ordered. [MP]    Clinical Course User Index [MP] Pamella Ozell LABOR, DO                                 Medical Decision Making 88 year old female with history as above including early dementia presents for agitated episode at skilled nursing facility.  Refuse meds and tried to escape.  Is calm awake alert and appropriate now.  Alert and oriented x 4 with no systemic complaints.  We will attempt to reach her nephew [name] who has power of attorney as well as the [facility] facility to further discuss this issue.  Hopefully she can return to the facility now that she is calm and cooperative.  If she becomes agitated here she may require further psychiatric evaluation.  Will obtain medical screening labs in preparation for potential psychiatric  evaluation and continue to monitor her here.  Amount and/or Complexity of Data Reviewed Labs: ordered.  Risk OTC drugs. Prescription drug management.           Final Clinical Impression(s) / ED Diagnoses Final diagnoses:  Dementia, unspecified dementia severity, unspecified dementia type, unspecified whether behavioral, psychotic, or mood disturbance or anxiety (HCC)  Agitation  COVID    Rx / DC Orders ED Discharge Orders     None         Pamella Ozell LABOR, DO 08/05/23 2307

## 2023-08-06 DIAGNOSIS — U071 COVID-19: Secondary | ICD-10-CM | POA: Diagnosis not present

## 2023-08-06 DIAGNOSIS — F03911 Unspecified dementia, unspecified severity, with agitation: Secondary | ICD-10-CM | POA: Diagnosis not present

## 2023-08-06 DIAGNOSIS — F0390 Unspecified dementia without behavioral disturbance: Secondary | ICD-10-CM | POA: Diagnosis not present

## 2023-08-06 DIAGNOSIS — R001 Bradycardia, unspecified: Secondary | ICD-10-CM | POA: Diagnosis not present

## 2023-08-06 MED ORDER — CITALOPRAM HYDROBROMIDE 10 MG PO TABS: ORAL_TABLET | ORAL | 0 refills | Status: DC

## 2023-08-06 NOTE — ED Notes (Signed)
Pt on with TTS.  

## 2023-08-06 NOTE — ED Notes (Signed)
 Called [facility] to update on pt status and make them aware of pts return. Charma Igo on the phone said she has to talk to her manager to see if they are going to be taking her back. Librarian, academic will not be in until the morning.

## 2023-08-06 NOTE — ED Provider Notes (Signed)
 Patient has been seen by psychiatry and cleared for discharge.  They recommend stopping Zoloft , but slowly escalating the dose of Celexa , these changes have been made.  Patient has been resting comfortably, and has not been agitated   Midge Golas, MD 08/06/23 [phone]

## 2023-08-06 NOTE — ED Notes (Signed)
 Came out of another pt room to find out this this pt had fallen. Pt back in room now. This nurse did not see the fall, was told about it.

## 2023-08-06 NOTE — Discharge Instructions (Signed)
 Stop Zoloft. Start Celexa 10 mg daily for 1 week.  Then increase this to 20 mg daily A prescription has been sent to her pharmacy

## 2023-08-06 NOTE — ED Provider Notes (Addendum)
 Emergency Medicine Observation Re-evaluation Note  Megan Daniel is a 88 y.o. female, seen on rounds today.  Pt initially presented to the ED for complaints of Medical Clearance Currently, the patient is sleeping.  Physical Exam  BP (!) 144/59 (BP Location: Right Arm)   Pulse (!) 56   Temp 98.2 F (36.8 C) (Oral)   Resp 16   Ht 5' 5 (1.651 m)   Wt 56 kg   SpO2 100%   BMI 20.54 kg/m  Physical Exam General: No acute distress Cardiac: Well-perfused Lungs: Nonlabored Psych: Calm  ED Course / MDM  EKG:EKG Interpretation Date/Time:  Wednesday August 05 2023 18:45:33 EST Ventricular Rate:  55 PR Interval:  140 QRS Duration:  74 QT Interval:  428 QTC Calculation: 409 R Axis:   28  Text Interpretation: Sinus bradycardia with sinus arrhythmia Interpretation limited secondary to artifact Confirmed by Midge Golas ([id number]) on 08/06/2023 3:54:45 AM  I have reviewed the labs performed to date as well as medications administered while in observation.  Recent changes in the last 24 hours include eval by psychiatry.  Plan  Current plan is for medication adjustments.  Psychiatrically cleared  7:45 PM.  I was informed by nurse that patient had walked out of her room and fell on the floor.  She is complaining of a little bit of left elbow pain.  There is full range of motion without any particular bony tenderness or limitations.  No overlying wounds or bruising.  No indications for x-ray imaging at this time.  Will continue to monitor.  9:15 AM.  Patient's ride is here for discharge.  Prescription has already been sent to pharmacy.  Towana Ozell BROCKS, MD 08/06/23 [id number]    Towana Ozell BROCKS, MD 08/06/23 [phone]
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
## 2023-08-17 DIAGNOSIS — F411 Generalized anxiety disorder: Secondary | ICD-10-CM | POA: Diagnosis not present

## 2023-08-17 DIAGNOSIS — R32 Unspecified urinary incontinence: Secondary | ICD-10-CM | POA: Diagnosis not present

## 2023-08-17 DIAGNOSIS — I10 Essential (primary) hypertension: Secondary | ICD-10-CM | POA: Diagnosis not present

## 2023-08-17 DIAGNOSIS — E785 Hyperlipidemia, unspecified: Secondary | ICD-10-CM | POA: Diagnosis not present

## 2023-08-17 DIAGNOSIS — F329 Major depressive disorder, single episode, unspecified: Secondary | ICD-10-CM | POA: Diagnosis not present

## 2023-08-19 DIAGNOSIS — F0390 Unspecified dementia without behavioral disturbance: Secondary | ICD-10-CM | POA: Diagnosis not present

## 2023-08-19 DIAGNOSIS — E034 Atrophy of thyroid (acquired): Secondary | ICD-10-CM | POA: Diagnosis not present

## 2023-08-19 DIAGNOSIS — I10 Essential (primary) hypertension: Secondary | ICD-10-CM | POA: Diagnosis not present

## 2023-08-25 DIAGNOSIS — F331 Major depressive disorder, recurrent, moderate: Secondary | ICD-10-CM | POA: Diagnosis not present

## 2023-08-25 DIAGNOSIS — G301 Alzheimer's disease with late onset: Secondary | ICD-10-CM | POA: Diagnosis not present

## 2023-08-31 DIAGNOSIS — F0390 Unspecified dementia without behavioral disturbance: Secondary | ICD-10-CM | POA: Diagnosis not present

## 2023-08-31 DIAGNOSIS — R630 Anorexia: Secondary | ICD-10-CM | POA: Diagnosis not present

## 2023-08-31 DIAGNOSIS — B351 Tinea unguium: Secondary | ICD-10-CM | POA: Diagnosis not present

## 2023-08-31 DIAGNOSIS — M79674 Pain in right toe(s): Secondary | ICD-10-CM | POA: Diagnosis not present

## 2023-08-31 DIAGNOSIS — M79675 Pain in left toe(s): Secondary | ICD-10-CM | POA: Diagnosis not present

## 2023-08-31 DIAGNOSIS — Z91148 Patient's other noncompliance with medication regimen for other reason: Secondary | ICD-10-CM | POA: Diagnosis not present

## 2023-09-08 DIAGNOSIS — F331 Major depressive disorder, recurrent, moderate: Secondary | ICD-10-CM | POA: Diagnosis not present

## 2023-09-08 DIAGNOSIS — F03B2 Unspecified dementia, moderate, with psychotic disturbance: Secondary | ICD-10-CM | POA: Diagnosis not present

## 2023-09-08 DIAGNOSIS — G301 Alzheimer's disease with late onset: Secondary | ICD-10-CM | POA: Diagnosis not present

## 2023-09-15 DIAGNOSIS — G301 Alzheimer's disease with late onset: Secondary | ICD-10-CM | POA: Diagnosis not present

## 2023-09-15 DIAGNOSIS — F331 Major depressive disorder, recurrent, moderate: Secondary | ICD-10-CM | POA: Diagnosis not present

## 2023-09-15 DIAGNOSIS — F03B2 Unspecified dementia, moderate, with psychotic disturbance: Secondary | ICD-10-CM | POA: Diagnosis not present

## 2023-09-21 DIAGNOSIS — F0390 Unspecified dementia without behavioral disturbance: Secondary | ICD-10-CM | POA: Diagnosis not present

## 2023-09-21 DIAGNOSIS — E034 Atrophy of thyroid (acquired): Secondary | ICD-10-CM | POA: Diagnosis not present

## 2023-09-21 DIAGNOSIS — I10 Essential (primary) hypertension: Secondary | ICD-10-CM | POA: Diagnosis not present

## 2023-09-30 DIAGNOSIS — E034 Atrophy of thyroid (acquired): Secondary | ICD-10-CM | POA: Diagnosis not present

## 2023-10-06 DIAGNOSIS — F0392 Unspecified dementia, unspecified severity, with psychotic disturbance: Secondary | ICD-10-CM | POA: Diagnosis not present

## 2023-10-06 DIAGNOSIS — E034 Atrophy of thyroid (acquired): Secondary | ICD-10-CM | POA: Diagnosis not present

## 2023-10-12 DIAGNOSIS — F064 Anxiety disorder due to known physiological condition: Secondary | ICD-10-CM | POA: Diagnosis not present

## 2023-10-12 DIAGNOSIS — R2681 Unsteadiness on feet: Secondary | ICD-10-CM | POA: Diagnosis not present

## 2023-10-12 DIAGNOSIS — F0392 Unspecified dementia, unspecified severity, with psychotic disturbance: Secondary | ICD-10-CM | POA: Diagnosis not present

## 2023-10-12 DIAGNOSIS — I272 Pulmonary hypertension, unspecified: Secondary | ICD-10-CM | POA: Diagnosis not present

## 2023-10-12 DIAGNOSIS — F32A Depression, unspecified: Secondary | ICD-10-CM | POA: Diagnosis not present

## 2023-10-13 DIAGNOSIS — F03B2 Unspecified dementia, moderate, with psychotic disturbance: Secondary | ICD-10-CM | POA: Diagnosis not present

## 2023-10-13 DIAGNOSIS — G301 Alzheimer's disease with late onset: Secondary | ICD-10-CM | POA: Diagnosis not present

## 2023-10-13 DIAGNOSIS — F331 Major depressive disorder, recurrent, moderate: Secondary | ICD-10-CM | POA: Diagnosis not present

## 2023-10-14 DIAGNOSIS — R2681 Unsteadiness on feet: Secondary | ICD-10-CM | POA: Diagnosis not present

## 2023-10-14 DIAGNOSIS — I272 Pulmonary hypertension, unspecified: Secondary | ICD-10-CM | POA: Diagnosis not present

## 2023-10-14 DIAGNOSIS — F064 Anxiety disorder due to known physiological condition: Secondary | ICD-10-CM | POA: Diagnosis not present

## 2023-10-14 DIAGNOSIS — F0392 Unspecified dementia, unspecified severity, with psychotic disturbance: Secondary | ICD-10-CM | POA: Diagnosis not present

## 2023-10-14 DIAGNOSIS — F32A Depression, unspecified: Secondary | ICD-10-CM | POA: Diagnosis not present

## 2023-10-19 DIAGNOSIS — R2681 Unsteadiness on feet: Secondary | ICD-10-CM | POA: Diagnosis not present

## 2023-10-19 DIAGNOSIS — I272 Pulmonary hypertension, unspecified: Secondary | ICD-10-CM | POA: Diagnosis not present

## 2023-10-19 DIAGNOSIS — M6281 Muscle weakness (generalized): Secondary | ICD-10-CM | POA: Diagnosis not present

## 2023-10-19 DIAGNOSIS — F32A Depression, unspecified: Secondary | ICD-10-CM | POA: Diagnosis not present

## 2023-10-19 DIAGNOSIS — F0392 Unspecified dementia, unspecified severity, with psychotic disturbance: Secondary | ICD-10-CM | POA: Diagnosis not present

## 2023-10-21 ENCOUNTER — Ambulatory Visit: Payer: Medicare Other | Admitting: Neurology

## 2023-10-21 DIAGNOSIS — F32A Depression, unspecified: Secondary | ICD-10-CM | POA: Diagnosis not present

## 2023-10-21 DIAGNOSIS — R2681 Unsteadiness on feet: Secondary | ICD-10-CM | POA: Diagnosis not present

## 2023-10-21 DIAGNOSIS — F0392 Unspecified dementia, unspecified severity, with psychotic disturbance: Secondary | ICD-10-CM | POA: Diagnosis not present

## 2023-10-21 DIAGNOSIS — I272 Pulmonary hypertension, unspecified: Secondary | ICD-10-CM | POA: Diagnosis not present

## 2023-10-22 ENCOUNTER — Other Ambulatory Visit: Payer: Self-pay

## 2023-10-22 ENCOUNTER — Emergency Department (HOSPITAL_COMMUNITY)

## 2023-10-22 ENCOUNTER — Emergency Department (HOSPITAL_COMMUNITY)
Admission: EM | Admit: 2023-10-22 | Discharge: 2023-10-22 | Disposition: A | Discharge status: Skilled Nursing Facility | Attending: Emergency Medicine | Admitting: Emergency Medicine

## 2023-10-22 DIAGNOSIS — E039 Hypothyroidism, unspecified: Secondary | ICD-10-CM | POA: Diagnosis not present

## 2023-10-22 DIAGNOSIS — F0390 Unspecified dementia without behavioral disturbance: Secondary | ICD-10-CM | POA: Insufficient documentation

## 2023-10-22 DIAGNOSIS — I672 Cerebral atherosclerosis: Secondary | ICD-10-CM | POA: Diagnosis not present

## 2023-10-22 DIAGNOSIS — R519 Headache, unspecified: Secondary | ICD-10-CM | POA: Diagnosis not present

## 2023-10-22 DIAGNOSIS — Z7401 Bed confinement status: Secondary | ICD-10-CM | POA: Diagnosis not present

## 2023-10-22 DIAGNOSIS — I6782 Cerebral ischemia: Secondary | ICD-10-CM | POA: Diagnosis not present

## 2023-10-22 DIAGNOSIS — F29 Unspecified psychosis not due to a substance or known physiological condition: Secondary | ICD-10-CM | POA: Diagnosis not present

## 2023-10-22 DIAGNOSIS — I10 Essential (primary) hypertension: Secondary | ICD-10-CM | POA: Insufficient documentation

## 2023-10-22 LAB — CBC
HCT: 39.8 % (ref 36.0–46.0)
Hemoglobin: 12.4 g/dL (ref 12.0–15.0)
MCH: 30.8 pg (ref 26.0–34.0)
MCHC: 31.2 g/dL (ref 30.0–36.0)
MCV: 99 fL (ref 80.0–100.0)
Platelets: 122 10*3/uL — ABNORMAL LOW (ref 150–400)
RBC: 4.02 MIL/uL (ref 3.87–5.11)
RDW: 14.3 % (ref 11.5–15.5)
WBC: 7.4 10*3/uL (ref 4.0–10.5)
nRBC: 0 % (ref 0.0–0.2)

## 2023-10-22 LAB — BASIC METABOLIC PANEL
Anion gap: 12 (ref 5–15)
BUN: 15 mg/dL (ref 8–23)
CO2: 24 mmol/L (ref 22–32)
Calcium: 10 mg/dL (ref 8.9–10.3)
Chloride: 107 mmol/L (ref 98–111)
Creatinine, Ser: 0.72 mg/dL (ref 0.44–1.00)
GFR, Estimated: >60 mL/min (ref >60)
Glucose, Bld: 100 mg/dL — ABNORMAL HIGH (ref 70–99)
Potassium: 3.3 mmol/L — ABNORMAL LOW (ref 3.5–5.1)
Sodium: 143 mmol/L (ref 135–145)

## 2023-10-22 LAB — URINALYSIS, ROUTINE W REFLEX MICROSCOPIC
Bacteria, UA: NONE SEEN
Bilirubin Urine: NEGATIVE
Glucose, UA: NEGATIVE mg/dL
Hgb urine dipstick: NEGATIVE
Ketones, ur: NEGATIVE mg/dL
Nitrite: NEGATIVE
Protein, ur: 30 mg/dL — AB
Specific Gravity, Urine: 1.019 (ref 1.005–1.030)
pH: 6 (ref 5.0–8.0)

## 2023-10-22 NOTE — ED Notes (Signed)
 Spoke with POA nephew on the phone for update
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
## 2023-10-22 NOTE — ED Notes (Addendum)
 Pt. Got out of bed and slid onto the floor. Pt. Laid out on the floor without hitting her head, no bleeding and no injuries. Pt. Redirected and put back in bed with 4 assist. Pt. Sleeping and no breathing difficulties. Pt. Resting and covered with blanket.
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
## 2023-10-22 NOTE — ED Notes (Signed)
 Report called to Malachi Bonds at [facility] facility.

## 2023-10-23 DIAGNOSIS — R2681 Unsteadiness on feet: Secondary | ICD-10-CM | POA: Diagnosis not present

## 2023-10-23 DIAGNOSIS — F32A Depression, unspecified: Secondary | ICD-10-CM | POA: Diagnosis not present

## 2023-10-23 DIAGNOSIS — I272 Pulmonary hypertension, unspecified: Secondary | ICD-10-CM | POA: Diagnosis not present

## 2023-10-23 DIAGNOSIS — F0392 Unspecified dementia, unspecified severity, with psychotic disturbance: Secondary | ICD-10-CM | POA: Diagnosis not present

## 2023-10-26 DIAGNOSIS — I272 Pulmonary hypertension, unspecified: Secondary | ICD-10-CM | POA: Diagnosis not present

## 2023-10-26 DIAGNOSIS — R2681 Unsteadiness on feet: Secondary | ICD-10-CM | POA: Diagnosis not present

## 2023-10-26 DIAGNOSIS — F0392 Unspecified dementia, unspecified severity, with psychotic disturbance: Secondary | ICD-10-CM | POA: Diagnosis not present

## 2023-10-26 DIAGNOSIS — F32A Depression, unspecified: Secondary | ICD-10-CM | POA: Diagnosis not present

## 2023-10-28 DIAGNOSIS — F0392 Unspecified dementia, unspecified severity, with psychotic disturbance: Secondary | ICD-10-CM | POA: Diagnosis not present

## 2023-10-28 DIAGNOSIS — F32A Depression, unspecified: Secondary | ICD-10-CM | POA: Diagnosis not present

## 2023-10-28 DIAGNOSIS — I272 Pulmonary hypertension, unspecified: Secondary | ICD-10-CM | POA: Diagnosis not present

## 2023-10-28 DIAGNOSIS — R2681 Unsteadiness on feet: Secondary | ICD-10-CM | POA: Diagnosis not present

## 2023-10-30 DIAGNOSIS — R2681 Unsteadiness on feet: Secondary | ICD-10-CM | POA: Diagnosis not present

## 2023-10-30 DIAGNOSIS — F0392 Unspecified dementia, unspecified severity, with psychotic disturbance: Secondary | ICD-10-CM | POA: Diagnosis not present

## 2023-10-30 DIAGNOSIS — F32A Depression, unspecified: Secondary | ICD-10-CM | POA: Diagnosis not present

## 2023-10-30 DIAGNOSIS — I272 Pulmonary hypertension, unspecified: Secondary | ICD-10-CM | POA: Diagnosis not present

## 2023-11-02 DIAGNOSIS — R197 Diarrhea, unspecified: Secondary | ICD-10-CM | POA: Diagnosis not present

## 2023-11-02 DIAGNOSIS — F32A Depression, unspecified: Secondary | ICD-10-CM | POA: Diagnosis not present

## 2023-11-02 DIAGNOSIS — F600 Paranoid personality disorder: Secondary | ICD-10-CM | POA: Diagnosis not present

## 2023-11-02 DIAGNOSIS — R2681 Unsteadiness on feet: Secondary | ICD-10-CM | POA: Diagnosis not present

## 2023-11-02 DIAGNOSIS — I272 Pulmonary hypertension, unspecified: Secondary | ICD-10-CM | POA: Diagnosis not present

## 2023-11-02 DIAGNOSIS — F0392 Unspecified dementia, unspecified severity, with psychotic disturbance: Secondary | ICD-10-CM | POA: Diagnosis not present

## 2023-11-04 DIAGNOSIS — I272 Pulmonary hypertension, unspecified: Secondary | ICD-10-CM | POA: Diagnosis not present

## 2023-11-04 DIAGNOSIS — F32A Depression, unspecified: Secondary | ICD-10-CM | POA: Diagnosis not present

## 2023-11-04 DIAGNOSIS — R2681 Unsteadiness on feet: Secondary | ICD-10-CM | POA: Diagnosis not present

## 2023-11-04 DIAGNOSIS — F0392 Unspecified dementia, unspecified severity, with psychotic disturbance: Secondary | ICD-10-CM | POA: Diagnosis not present

## 2023-11-06 DIAGNOSIS — I272 Pulmonary hypertension, unspecified: Secondary | ICD-10-CM | POA: Diagnosis not present

## 2023-11-06 DIAGNOSIS — F32A Depression, unspecified: Secondary | ICD-10-CM | POA: Diagnosis not present

## 2023-11-06 DIAGNOSIS — R2681 Unsteadiness on feet: Secondary | ICD-10-CM | POA: Diagnosis not present

## 2023-11-06 DIAGNOSIS — F0392 Unspecified dementia, unspecified severity, with psychotic disturbance: Secondary | ICD-10-CM | POA: Diagnosis not present

## 2023-11-09 DIAGNOSIS — F32A Depression, unspecified: Secondary | ICD-10-CM | POA: Diagnosis not present

## 2023-11-09 DIAGNOSIS — I272 Pulmonary hypertension, unspecified: Secondary | ICD-10-CM | POA: Diagnosis not present

## 2023-11-09 DIAGNOSIS — R2681 Unsteadiness on feet: Secondary | ICD-10-CM | POA: Diagnosis not present

## 2023-11-09 DIAGNOSIS — F0392 Unspecified dementia, unspecified severity, with psychotic disturbance: Secondary | ICD-10-CM | POA: Diagnosis not present

## 2023-11-11 DIAGNOSIS — F0392 Unspecified dementia, unspecified severity, with psychotic disturbance: Secondary | ICD-10-CM | POA: Diagnosis not present

## 2023-11-11 DIAGNOSIS — F32A Depression, unspecified: Secondary | ICD-10-CM | POA: Diagnosis not present

## 2023-11-11 DIAGNOSIS — R2681 Unsteadiness on feet: Secondary | ICD-10-CM | POA: Diagnosis not present

## 2023-11-11 DIAGNOSIS — I272 Pulmonary hypertension, unspecified: Secondary | ICD-10-CM | POA: Diagnosis not present

## 2023-11-13 DIAGNOSIS — F32A Depression, unspecified: Secondary | ICD-10-CM | POA: Diagnosis not present

## 2023-11-13 DIAGNOSIS — R2681 Unsteadiness on feet: Secondary | ICD-10-CM | POA: Diagnosis not present

## 2023-11-13 DIAGNOSIS — I272 Pulmonary hypertension, unspecified: Secondary | ICD-10-CM | POA: Diagnosis not present

## 2023-11-13 DIAGNOSIS — F0392 Unspecified dementia, unspecified severity, with psychotic disturbance: Secondary | ICD-10-CM | POA: Diagnosis not present

## 2023-11-16 DIAGNOSIS — R2681 Unsteadiness on feet: Secondary | ICD-10-CM | POA: Diagnosis not present

## 2023-11-16 DIAGNOSIS — F32A Depression, unspecified: Secondary | ICD-10-CM | POA: Diagnosis not present

## 2023-11-16 DIAGNOSIS — F0392 Unspecified dementia, unspecified severity, with psychotic disturbance: Secondary | ICD-10-CM | POA: Diagnosis not present

## 2023-11-16 DIAGNOSIS — I272 Pulmonary hypertension, unspecified: Secondary | ICD-10-CM | POA: Diagnosis not present

## 2023-11-18 DIAGNOSIS — I272 Pulmonary hypertension, unspecified: Secondary | ICD-10-CM | POA: Diagnosis not present

## 2023-11-18 DIAGNOSIS — F32A Depression, unspecified: Secondary | ICD-10-CM | POA: Diagnosis not present

## 2023-11-18 DIAGNOSIS — R2681 Unsteadiness on feet: Secondary | ICD-10-CM | POA: Diagnosis not present

## 2023-11-18 DIAGNOSIS — F0392 Unspecified dementia, unspecified severity, with psychotic disturbance: Secondary | ICD-10-CM | POA: Diagnosis not present

## 2023-11-19 DIAGNOSIS — R2681 Unsteadiness on feet: Secondary | ICD-10-CM | POA: Diagnosis not present

## 2023-11-19 DIAGNOSIS — F32A Depression, unspecified: Secondary | ICD-10-CM | POA: Diagnosis not present

## 2023-11-19 DIAGNOSIS — I272 Pulmonary hypertension, unspecified: Secondary | ICD-10-CM | POA: Diagnosis not present

## 2023-11-19 DIAGNOSIS — F0392 Unspecified dementia, unspecified severity, with psychotic disturbance: Secondary | ICD-10-CM | POA: Diagnosis not present

## 2024-02-09 ENCOUNTER — Encounter (HOSPITAL_COMMUNITY): Payer: Self-pay

## 2024-02-09 ENCOUNTER — Other Ambulatory Visit: Payer: Self-pay

## 2024-02-09 ENCOUNTER — Emergency Department (HOSPITAL_COMMUNITY)

## 2024-02-09 ENCOUNTER — Emergency Department (HOSPITAL_COMMUNITY)
Admission: EM | Admit: 2024-02-09 | Discharge: 2024-02-09 | Disposition: A | Discharge status: Home / Self Care | Source: Skilled Nursing Facility

## 2024-02-09 DIAGNOSIS — F0390 Unspecified dementia without behavioral disturbance: Secondary | ICD-10-CM | POA: Insufficient documentation

## 2024-02-09 DIAGNOSIS — S0990XA Unspecified injury of head, initial encounter: Secondary | ICD-10-CM | POA: Insufficient documentation

## 2024-02-09 DIAGNOSIS — W19XXXA Unspecified fall, initial encounter: Secondary | ICD-10-CM | POA: Diagnosis not present

## 2024-02-09 DIAGNOSIS — M25531 Pain in right wrist: Secondary | ICD-10-CM | POA: Diagnosis not present

## 2024-02-09 DIAGNOSIS — Y92129 Unspecified place in nursing home as the place of occurrence of the external cause: Secondary | ICD-10-CM | POA: Diagnosis not present

## 2024-02-09 LAB — CBC WITH DIFFERENTIAL/PLATELET
Abs Immature Granulocytes: 0.06 K/uL (ref 0.00–0.07)
Basophils Absolute: 0.1 K/uL (ref 0.0–0.1)
Basophils Relative: 1 %
Eosinophils Absolute: 0 K/uL (ref 0.0–0.5)
Eosinophils Relative: 0 %
HCT: 36.9 % (ref 36.0–46.0)
Hemoglobin: 12.1 g/dL (ref 12.0–15.0)
Immature Granulocytes: 1 %
Lymphocytes Relative: 44 %
Lymphs Abs: 5.4 K/uL — ABNORMAL HIGH (ref 0.7–4.0)
MCH: 31.4 pg (ref 26.0–34.0)
MCHC: 32.8 g/dL (ref 30.0–36.0)
MCV: 95.8 fL (ref 80.0–100.0)
Monocytes Absolute: 0.8 K/uL (ref 0.1–1.0)
Monocytes Relative: 7 %
Neutro Abs: 5.9 K/uL (ref 1.7–7.7)
Neutrophils Relative %: 47 %
Platelets: 169 K/uL (ref 150–400)
RBC: 3.85 MIL/uL — ABNORMAL LOW (ref 3.87–5.11)
RDW: 12.6 % (ref 11.5–15.5)
WBC Morphology: INCREASED
WBC: 12.2 K/uL — ABNORMAL HIGH (ref 4.0–10.5)
nRBC: 0 % (ref 0.0–0.2)

## 2024-02-09 LAB — COMPREHENSIVE METABOLIC PANEL WITH GFR
ALT: 11 U/L (ref 0–44)
AST: 20 U/L (ref 15–41)
Albumin: 3.5 g/dL (ref 3.5–5.0)
Alkaline Phosphatase: 89 U/L (ref 38–126)
Anion gap: 11 (ref 5–15)
BUN: 19 mg/dL (ref 8–23)
CO2: 26 mmol/L (ref 22–32)
Calcium: 10 mg/dL (ref 8.9–10.3)
Chloride: 101 mmol/L (ref 98–111)
Creatinine, Ser: 0.76 mg/dL (ref 0.44–1.00)
GFR, Estimated: >60 mL/min (ref >60)
Glucose, Bld: 112 mg/dL — ABNORMAL HIGH (ref 70–99)
Potassium: 4 mmol/L (ref 3.5–5.1)
Sodium: 138 mmol/L (ref 135–145)
Total Bilirubin: 0.9 mg/dL (ref 0.0–1.2)
Total Protein: 6.9 g/dL (ref 6.5–8.1)

## 2024-02-09 LAB — CK: Total CK: 139 U/L (ref 38–234)

## 2024-02-09 MED ORDER — ACETAMINOPHEN 325 MG PO TABS
650.0000 mg | ORAL_TABLET | Freq: Once | ORAL | Status: AC
  Administered 2024-02-09: 650 mg via ORAL
  Filled 2024-02-09: qty 2
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
## 2024-02-09 NOTE — Discharge Instructions (Addendum)
 Your x-ray did not show any acute findings on today's visit, you were placed on a right wrist splint.Please keep the splint in place for comfort.   Your CT of the Head and neck was within normal limits.

## 2024-02-09 NOTE — ED Notes (Signed)
 Patient had splint laying on bed and removes repeatedly.

## 2024-02-09 NOTE — ED Triage Notes (Signed)
 EMS reports from [facility] called out for unwitnessed fall. Nurse found Pt in bathroom floor. Pt c/o R wrist pain with edema. Unknown LOC, no blood thinners, no other obvious injuries. Pt Hx of dementia, staff states at baseline.  BP 118 HR 70 RR 16 Sp02 97 RA CBG 150

## 2024-02-09 NOTE — Progress Notes (Signed)
 Orthopedic Tech Progress Note Patient Details:  Megan Daniel [birth year] [id number]  Ortho Devices Type of Ortho Device: Velcro wrist splint Ortho Device/Splint Location: right Ortho Device/Splint Interventions: Ordered, Application, Adjustment   Post Interventions Patient Tolerated: Well Instructions Provided: Adjustment of device, Care of device  [name] Loving 02/09/2024, 3:52 PM

## 2024-02-19 ENCOUNTER — Emergency Department (HOSPITAL_COMMUNITY)

## 2024-02-19 ENCOUNTER — Encounter (HOSPITAL_COMMUNITY): Payer: Self-pay | Admitting: Emergency Medicine

## 2024-02-19 ENCOUNTER — Other Ambulatory Visit: Payer: Self-pay

## 2024-02-19 ENCOUNTER — Emergency Department (HOSPITAL_COMMUNITY)
Admission: EM | Admit: 2024-02-19 | Discharge: 2024-02-20 | Disposition: A | Discharge status: Home / Self Care | Attending: Emergency Medicine | Admitting: Emergency Medicine

## 2024-02-19 DIAGNOSIS — I10 Essential (primary) hypertension: Secondary | ICD-10-CM | POA: Insufficient documentation

## 2024-02-19 DIAGNOSIS — Z79899 Other long term (current) drug therapy: Secondary | ICD-10-CM | POA: Diagnosis not present

## 2024-02-19 DIAGNOSIS — E039 Hypothyroidism, unspecified: Secondary | ICD-10-CM | POA: Diagnosis not present

## 2024-02-19 DIAGNOSIS — M25531 Pain in right wrist: Secondary | ICD-10-CM | POA: Insufficient documentation

## 2024-02-19 DIAGNOSIS — D72829 Elevated white blood cell count, unspecified: Secondary | ICD-10-CM | POA: Insufficient documentation

## 2024-02-19 LAB — COMPREHENSIVE METABOLIC PANEL WITH GFR
ALT: 10 U/L (ref 0–44)
AST: 16 U/L (ref 15–41)
Albumin: 3.4 g/dL — ABNORMAL LOW (ref 3.5–5.0)
Alkaline Phosphatase: 95 U/L (ref 38–126)
Anion gap: 10 (ref 5–15)
BUN: 19 mg/dL (ref 8–23)
CO2: 29 mmol/L (ref 22–32)
Calcium: 9.7 mg/dL (ref 8.9–10.3)
Chloride: 100 mmol/L (ref 98–111)
Creatinine, Ser: 0.72 mg/dL (ref 0.44–1.00)
GFR, Estimated: >60 mL/min (ref >60)
Glucose, Bld: 101 mg/dL — ABNORMAL HIGH (ref 70–99)
Potassium: 3.6 mmol/L (ref 3.5–5.1)
Sodium: 139 mmol/L (ref 135–145)
Total Bilirubin: 0.7 mg/dL (ref 0.0–1.2)
Total Protein: 6.8 g/dL (ref 6.5–8.1)

## 2024-02-19 LAB — CBC WITH DIFFERENTIAL/PLATELET
Abs Immature Granulocytes: 0.02 K/uL (ref 0.00–0.07)
Basophils Absolute: 0.1 K/uL (ref 0.0–0.1)
Basophils Relative: 1 %
Eosinophils Absolute: 0.1 K/uL (ref 0.0–0.5)
Eosinophils Relative: 1 %
HCT: 39 % (ref 36.0–46.0)
Hemoglobin: 12.2 g/dL (ref 12.0–15.0)
Immature Granulocytes: 0 %
Lymphocytes Relative: 61 %
Lymphs Abs: 6.6 K/uL — ABNORMAL HIGH (ref 0.7–4.0)
MCH: 29.6 pg (ref 26.0–34.0)
MCHC: 31.3 g/dL (ref 30.0–36.0)
MCV: 94.7 fL (ref 80.0–100.0)
Monocytes Absolute: 0.4 K/uL (ref 0.1–1.0)
Monocytes Relative: 4 %
Neutro Abs: 3.6 K/uL (ref 1.7–7.7)
Neutrophils Relative %: 33 %
Platelets: 226 K/uL (ref 150–400)
RBC: 4.12 MIL/uL (ref 3.87–5.11)
RDW: 12.7 % (ref 11.5–15.5)
WBC: 10.8 K/uL — ABNORMAL HIGH (ref 4.0–10.5)
nRBC: 0 % (ref 0.0–0.2)

## 2024-02-19 LAB — CK: Total CK: 16 U/L — ABNORMAL LOW (ref 38–234)

## 2024-02-19 LAB — SEDIMENTATION RATE: Sed Rate: 18 mm/h (ref 0–22)

## 2024-02-19 MED ORDER — PREDNISONE 20 MG PO TABS
60.0000 mg | ORAL_TABLET | Freq: Once | ORAL | Status: AC
  Administered 2024-02-19: 60 mg via ORAL
  Filled 2024-02-19: qty 3

## 2024-02-19 MED ORDER — KETOROLAC TROMETHAMINE 15 MG/ML IJ SOLN
10.0000 mg | Freq: Once | INTRAMUSCULAR | Status: AC
  Administered 2024-02-19: 10 mg via INTRAVENOUS
  Filled 2024-02-19: qty 1

## 2024-02-19 MED ORDER — PREDNISONE 10 MG (21) PO TBPK: ORAL_TABLET | Freq: Every day | ORAL | 0 refills | Status: DC

## 2024-02-19 NOTE — ED Provider Notes (Signed)
 I provided a substantive portion of the care of this patient.  I personally made/approved the management plan for this patient and take responsibility for the patient management.       Randol Simmonds, MD 02/20/24 [phone]

## 2024-02-19 NOTE — ED Notes (Signed)
 [facility] called

## 2024-02-19 NOTE — ED Triage Notes (Addendum)
 Patient presents post fall 7/8 in which she had right hand pain and swelling. EMS notes possible deformity. Patient guards when touching it.   EMS vitals: 104/52 BP 68 HR 100% SPO2 on room air

## 2024-02-19 NOTE — Discharge Instructions (Signed)
 You were evaluated in the emergency room for wrist pain.  Your lab work did not show any significant abnormality.  Your imaging showed significant arthritis without acute abnormality.  Overall your exam was consistent with a inflammatory arthropathy such as gout.  A prescription for steroids was sent to your pharmacy.  Please be sure to complete the full course of prednisone and follow-up with the orthopedic doctor.  You can also take ibuprofen if you can tolerate this.

## 2024-02-19 NOTE — ED Provider Notes (Signed)
 [place] EMERGENCY DEPARTMENT AT [facility] Provider Note   CSN: [id number] Arrival date & time: 02/19/24  1606     Patient presents with: Hand Pain   Megan Daniel is a 88 y.o. female with history of dementia, gout and hypertension who presents with complaints of right wrist pain.  Patient is overall poor historian.  Appears that she did sustain a fall few few weeks ago.  She had x-rays at that time of the hand which showed moderate to severe degenerative changes, no acute osseous abnormality.    Hand Pain   Past Medical History:  Diagnosis Date   Abdominal pain    Allergic rhinitis    Anxiety 10/03/2013   Arthralgia 03/31/2014   Bronchitis 09/09/2013   Cellulitis    Diverticulitis    Diverticulosis    Fatigue    GERD (gastroesophageal reflux disease)    Gout    Headache    Headache    Hyperchloremia    Hypercholesterolemia    Hypertension    Hypothyroidism    Lumbar strain    Major depressive disorder    Migraine    Osteoporosis    Rheumatoid arthritis (HCC)    Scoliosis    Sleep apnea        Prior to Admission medications   Medication Sig Start Date End Date Taking? Authorizing Provider  predniSONE  (STERAPRED UNI-PAK 21 TAB) 10 MG (21) TBPK tablet Take by mouth daily. Take 6 tabs by mouth daily  for 2 days, then 5 tabs for 2 days, then 4 tabs for 2 days, then 3 tabs for 2 days, 2 tabs for 2 days, then 1 tab by mouth daily for 2 days 02/19/24  Yes Donnajean Lynwood DEL, PA-C  acetaminophen  (TYLENOL ) 500 MG tablet Take 1,000 mg by mouth every 6 (six) hours as needed for mild pain or moderate pain.    [provider]  atorvastatin  (LIPITOR) 40 MG tablet Take 1 tablet (40 mg total) by mouth daily. 06/12/23 09/10/23  Barbaraann Darryle Ned, MD  citalopram  (CELEXA ) 10 MG tablet Take 1 tablet daily by mouth for 1 week, then increase to 2 tablets daily by mouth 08/06/23   Midge Golas, MD  Cyanocobalamin  (VITAMIN B-12 PO) Take 1,000 mcg by mouth  daily. Patient not taking: Reported on 06/12/2023    [provider]  donepezil  (ARICEPT ) 5 MG tablet Take 5 mg by mouth daily. 07/13/23   [provider]  famotidine  (PEPCID ) 40 MG tablet TAKE 1 TABLET BY MOUTH AT BEDTIME Patient taking differently: Take 40 mg by mouth daily as needed for indigestion. 01/01/21   Golda Claudis PENNER, MD  fexofenadine (ALLEGRA ALLERGY) 180 MG tablet Take 180 mg by mouth daily.    [provider]  hydrochlorothiazide  (HYDRODIURIL ) 12.5 MG tablet Take 12.5 mg by mouth daily.    [provider]  Anselm Oil 500 MG CAPS Take 500 mg by mouth daily. Patient not taking: Reported on 06/12/2023    [provider]  LAGEVRIO  200 MG CAPS capsule Take 4 capsules by mouth 2 (two) times daily. 07/30/23   [provider]  levothyroxine  (SYNTHROID , LEVOTHROID) 100 MCG tablet Take 100 mcg by mouth daily before breakfast.    [provider]  Melatonin 10 MG TABS Take by mouth at bedtime.    [provider]  Multiple Minerals-Vitamins (CAL MAG ZINC +D3) TABS Take 1 tablet by mouth daily. Patient not taking: Reported on 06/12/2023    [provider]  Multiple Vitamins-Minerals (ABC COMPLETE SENIOR WOMENS 50+ PO) Take 1 capsule by mouth daily with breakfast.    [provider]  Multiple Vitamins-Minerals (PRESERVISION/LUTEIN PO) Take 1 tablet by mouth 2 (two) times daily. Patient not taking: Reported on 06/12/2023    [provider]  nitrofurantoin, macrocrystal-monohydrate, (MACROBID) 100 MG capsule Take 100 mg by mouth 2 (two) times daily. 07/23/23   [provider]  Omega-3 1400 MG CAPS Take 1,400 mg by mouth daily. Patient not taking: Reported on 06/12/2023    [provider]  Probiotic Product (PROBIOTIC DAILY PO) Take 1 tablet by mouth daily.    [provider]  traMADol  (ULTRAM ) 50 MG tablet Take 1 tablet (50 mg total) by mouth every 6 (six) hours as  needed. Patient taking differently: Take 50 mg by mouth every 6 (six) hours as needed for moderate pain (pain score 4-6). 02/12/15   Ines Onetha NOVAK, MD  triamcinolone cream (KENALOG) 0.1 % Apply 1 application. topically daily as needed (In ear after shower).    [provider]  verapamil  (CALAN -SR) 180 MG CR tablet Take 180 mg by mouth daily. 06/04/23   [provider]  verapamil  (CALAN -SR) 240 MG CR tablet Take 240 mg by mouth daily. Patient not taking: Reported on 06/12/2023 06/07/19   [provider]    Allergies: Dexmedetomidine, Ibuprofen, Zyrtec [cetirizine], and Flexeril  [cyclobenzaprine ]    Review of Systems  Musculoskeletal:  Positive for arthralgias.    Updated Vital Signs BP 127/72   Pulse 69   Temp 98.3 F (36.8 C) (Oral)   Resp 18   SpO2 100%   Physical Exam Vitals and nursing note reviewed.  Constitutional:      General: She is not in acute distress.    Appearance: She is well-developed.  HENT:     Head: Normocephalic and atraumatic.  Eyes:     Conjunctiva/sclera: Conjunctivae normal.  Cardiovascular:     Rate and Rhythm: Normal rate and regular rhythm.     Heart sounds: No murmur heard. Pulmonary:     Effort: Pulmonary effort is normal. No respiratory distress.     Breath sounds: Normal breath sounds.  Abdominal:     Palpations: Abdomen is soft.     Tenderness: There is no abdominal tenderness.  Musculoskeletal:     Cervical back: Neck supple.     Comments: Right wrist maintained in guarded position, swelling about the wrist into the digits, increased warmth without erythema, does not tolerate any range of motion of the wrist, tenderness to the dorsum of the wrist, radial pulses 2+  Skin:    General: Skin is warm and dry.     Capillary Refill: Capillary refill takes less than 2 seconds.  Neurological:     Mental Status: She is alert.  Psychiatric:        Mood and Affect: Mood normal.     (all labs ordered are listed, but  only abnormal results are displayed) Labs Reviewed  CBC WITH DIFFERENTIAL/PLATELET - Abnormal; Notable for the following components:      Result Value   WBC 10.8 (*)    Lymphs Abs 6.6 (*)    All other components within normal limits  COMPREHENSIVE METABOLIC PANEL WITH GFR - Abnormal; Notable for the following components:   Glucose, Bld 101 (*)    Albumin 3.4 (*)    All other components within normal limits  CK - Abnormal; Notable for the following components:   Total CK 16 (*)  All other components within normal limits  SEDIMENTATION RATE    EKG: None  Radiology: DG Wrist Complete Right Result Date: 02/19/2024 CLINICAL DATA:  pain EXAM: RIGHT WRIST - COMPLETE 3+ VIEW COMPARISON:  February 09, 2024 FINDINGS: Diffuse osteopenia.Advanced degenerative changes of the first carpometacarpal and triscaphe joints. Evaluation of the carpal bones for fracture is suboptimal due to the osteopenia and degenerative changes. Chondrocalcinosis again noted. Soft tissues are unremarkable. IMPRESSION: No dislocation of the wrist. Suboptimal evaluation of the carpal bones for fracture due to the osteopenia and degenerative changes. If there is additional clinical concern for fracture, a follow-up CT or MRI of the wrist would be recommended. Electronically Signed   By: Rogelia Myers M.D.   On: 02/19/2024 18:37     Procedures   Medications Ordered in the ED  ketorolac  (TORADOL ) 15 MG/ML injection 10 mg (has no administration in time range)  predniSONE  (DELTASONE ) tablet 60 mg (has no administration in time range)                                    Medical Decision Making Amount and/or Complexity of Data Reviewed Labs: ordered. Radiology: ordered.   This patient presents to the ED with chief complaint(s) of wrist pain .  The complaint involves an extensive differential diagnosis and also carries with it a high risk of complications and morbidity.   Pertinent past medical history as listed in  HPI  The differential diagnosis includes  Septic joint, gout, fracture, dislocations, sprain, arthritis  Additional history obtained:  Records reviewed Care Everywhere/External Records  Assessment and management:   Hemodynamically stable patient presented with complaints of right wrist pain.  Patient has dementia and is overall poor historian.  She is here from her facility.  She reportedly fell on 7/8.  Had hand x-rays at that time which were negative for acute abnormality.  On exam today she maintains her wrist in a guarded position.  There is swelling, minimal erythema and warmth involving the right wrist with generalized tenderness, no snuffbox tenderness.  Left wrist x-rays again today did not demonstrate any acute abnormality, although there is moderate to severe degenerative changes primarily involving the carpal bones.  Appears that she does have a history of gout.  Although I am unable to view with that she has a history of gout to the affected extremity.  Discussed patient with family, they are unsure of any specific history of gout.  She does have a mild leukocytosis, however it appears to be downtrending from 10 days ago.  ESR is without elevation.  Given patient's notable pain we will place in a wrist brace.  Appears that she does not tolerate long-term use of NSAIDs.  Will give a single dose of Toradol  here in the ED and send in prescription for prednisone  to treat for inflammatory arthropathy.  Will provide Ortho follow-up. Independent ECG interpretation:  none  Independent labs interpretation:  The following labs were independently interpreted:  BC with mild leukocytosis of 10.8, CMP without significant abnormality, CK within normal limits, ESR without elevation  Independent visualization and interpretation of imaging: I independently visualized the following imaging with scope of interpretation limited to determining acute life threatening conditions related to emergency care:   Wrist x-ray degenerative changes without acute abnormality   Consultations obtained:   none  Disposition:   Patient will be discharged home. The patient has been appropriately medically screened and/or  stabilized in the ED. I have low suspicion for any other emergent medical condition which would require further screening, evaluation or treatment in the ED or require inpatient management. At time of discharge the patient is hemodynamically stable and in no acute distress. I have discussed work-up results and diagnosis with patient and answered all questions. Patient is agreeable with discharge plan. We discussed strict return precautions for returning to the emergency department and they verbalized understanding.     Social Determinants of Health:   none  This note was dictated with voice recognition software.  Despite best efforts at proofreading, errors may have occurred which can change the documentation meaning.       Final diagnoses:  Right wrist pain    ED Discharge Orders          Ordered    predniSONE  (STERAPRED UNI-PAK 21 TAB) 10 MG (21) TBPK tablet  Daily        02/19/24 2216               Ellyse Rotolo H, PA-C 02/19/24 2217    Randol Simmonds, MD 02/20/24 [phone]

## 2024-03-25 ENCOUNTER — Encounter: Payer: Self-pay | Admitting: Radiology

## 2024-05-18 ENCOUNTER — Encounter (INDEPENDENT_AMBULATORY_CARE_PROVIDER_SITE_OTHER): Payer: Self-pay | Admitting: Gastroenterology

## 2024-06-06 ENCOUNTER — Encounter: Payer: Self-pay | Admitting: Radiology

## 2024-08-31 ENCOUNTER — Inpatient Hospital Stay (HOSPITAL_COMMUNITY)
Admission: EM | Admit: 2024-08-31 | Discharge: 2024-09-08 | DRG: 517 | Disposition: A | Discharge status: Skilled Nursing Facility | Source: Skilled Nursing Facility | Attending: Internal Medicine | Admitting: Internal Medicine

## 2024-08-31 ENCOUNTER — Emergency Department (HOSPITAL_COMMUNITY)

## 2024-08-31 DIAGNOSIS — I10 Essential (primary) hypertension: Secondary | ICD-10-CM | POA: Diagnosis present

## 2024-08-31 DIAGNOSIS — M51369 Other intervertebral disc degeneration, lumbar region without mention of lumbar back pain or lower extremity pain: Secondary | ICD-10-CM | POA: Diagnosis present

## 2024-08-31 DIAGNOSIS — W19XXXA Unspecified fall, initial encounter: Secondary | ICD-10-CM | POA: Diagnosis present

## 2024-08-31 DIAGNOSIS — Z8261 Family history of arthritis: Secondary | ICD-10-CM

## 2024-08-31 DIAGNOSIS — Z811 Family history of alcohol abuse and dependence: Secondary | ICD-10-CM

## 2024-08-31 DIAGNOSIS — Z79890 Hormone replacement therapy (postmenopausal): Secondary | ICD-10-CM

## 2024-08-31 DIAGNOSIS — G43909 Migraine, unspecified, not intractable, without status migrainosus: Secondary | ICD-10-CM | POA: Diagnosis present

## 2024-08-31 DIAGNOSIS — Z886 Allergy status to analgesic agent status: Secondary | ICD-10-CM

## 2024-08-31 DIAGNOSIS — M069 Rheumatoid arthritis, unspecified: Secondary | ICD-10-CM | POA: Diagnosis present

## 2024-08-31 DIAGNOSIS — M159 Polyosteoarthritis, unspecified: Secondary | ICD-10-CM | POA: Diagnosis present

## 2024-08-31 DIAGNOSIS — G8929 Other chronic pain: Secondary | ICD-10-CM | POA: Diagnosis present

## 2024-08-31 DIAGNOSIS — Y92009 Unspecified place in unspecified non-institutional (private) residence as the place of occurrence of the external cause: Secondary | ICD-10-CM

## 2024-08-31 DIAGNOSIS — G4730 Sleep apnea, unspecified: Secondary | ICD-10-CM | POA: Diagnosis present

## 2024-08-31 DIAGNOSIS — M419 Scoliosis, unspecified: Secondary | ICD-10-CM | POA: Diagnosis present

## 2024-08-31 DIAGNOSIS — Z8249 Family history of ischemic heart disease and other diseases of the circulatory system: Secondary | ICD-10-CM

## 2024-08-31 DIAGNOSIS — Z90710 Acquired absence of both cervix and uterus: Secondary | ICD-10-CM

## 2024-08-31 DIAGNOSIS — E7800 Pure hypercholesterolemia, unspecified: Secondary | ICD-10-CM | POA: Diagnosis present

## 2024-08-31 DIAGNOSIS — G629 Polyneuropathy, unspecified: Secondary | ICD-10-CM | POA: Diagnosis present

## 2024-08-31 DIAGNOSIS — M109 Gout, unspecified: Secondary | ICD-10-CM | POA: Diagnosis present

## 2024-08-31 DIAGNOSIS — Z888 Allergy status to other drugs, medicaments and biological substances status: Secondary | ICD-10-CM

## 2024-08-31 DIAGNOSIS — Z820 Family history of epilepsy and other diseases of the nervous system: Secondary | ICD-10-CM

## 2024-08-31 DIAGNOSIS — S0003XA Contusion of scalp, initial encounter: Secondary | ICD-10-CM | POA: Diagnosis present

## 2024-08-31 DIAGNOSIS — S32021A Stable burst fracture of second lumbar vertebra, initial encounter for closed fracture: Principal | ICD-10-CM | POA: Diagnosis present

## 2024-08-31 DIAGNOSIS — E039 Hypothyroidism, unspecified: Secondary | ICD-10-CM | POA: Diagnosis present

## 2024-08-31 DIAGNOSIS — S32020A Wedge compression fracture of second lumbar vertebra, initial encounter for closed fracture: Principal | ICD-10-CM | POA: Diagnosis present

## 2024-08-31 DIAGNOSIS — Z7982 Long term (current) use of aspirin: Secondary | ICD-10-CM

## 2024-08-31 DIAGNOSIS — Z800 Family history of malignant neoplasm of digestive organs: Secondary | ICD-10-CM

## 2024-08-31 DIAGNOSIS — Z79899 Other long term (current) drug therapy: Secondary | ICD-10-CM

## 2024-08-31 DIAGNOSIS — Z87891 Personal history of nicotine dependence: Secondary | ICD-10-CM

## 2024-08-31 DIAGNOSIS — M810 Age-related osteoporosis without current pathological fracture: Secondary | ICD-10-CM | POA: Diagnosis present

## 2024-08-31 DIAGNOSIS — Z825 Family history of asthma and other chronic lower respiratory diseases: Secondary | ICD-10-CM

## 2024-08-31 DIAGNOSIS — K219 Gastro-esophageal reflux disease without esophagitis: Secondary | ICD-10-CM | POA: Diagnosis present

## 2024-08-31 DIAGNOSIS — I9581 Postprocedural hypotension: Secondary | ICD-10-CM | POA: Diagnosis not present

## 2024-08-31 DIAGNOSIS — F03A0 Unspecified dementia, mild, without behavioral disturbance, psychotic disturbance, mood disturbance, and anxiety: Secondary | ICD-10-CM | POA: Diagnosis present

## 2024-08-31 MED ORDER — FENTANYL CITRATE (PF) 50 MCG/ML IJ SOSY
50.0000 ug | PREFILLED_SYRINGE | Freq: Once | INTRAMUSCULAR | Status: AC
  Administered 2024-09-01: 50 ug via INTRAMUSCULAR
  Filled 2024-08-31: qty 1

## 2024-08-31 NOTE — ED Triage Notes (Signed)
 Pt BIBA from [facility], c/o unwitnessed fall. Pt is having multiple complaints. Hematoma to the back of head. Denies blood thinners. SA with PVCs. VSS. Hx of dementia.   CBG 167

## 2024-08-31 NOTE — ED Provider Notes (Signed)
 " [place] [facility] AT [facility] Provider Note  CSN: [id number] Arrival date & time: 08/31/24 2239  Chief Complaint(s) Fall  History provided by patient. HPI & MDM Megan Daniel is a 89 y.o. female .   Fall   Clinical Course as of 09/01/24 0411  Wed Aug 31, 2024  2329 Patient presents from nursing facility after an unwitnessed fall.  Patient has a history of dementia but is able to to provide details regarding the incident.  She is alert and oriented x 4.   Patient reports that she was in the bathroom, spilled water  and fell while trying to clean up the water .  Patient reports falling backwards.  She endorses occipital headache.  Denies any neck pain.  Endorses lower back pain.  No chest pain or abdominal pain.  No extremity pain.   [PC]  2330  Medical Decision Making:  The patient's presentation involves an extensive number of treatment options, and the complaint(s) carry with it a high risk of complications and morbidity. The differential diagnosis includes but not limited to those listed below:  Exam was notable for right occipital hematoma and lumbar tenderness to palpation.  No other injuries noted on exam concerning for any serious internal injuries.  CT of the head and cervical spine ordered to assess for ICH or cervical injury.  Plain film of the lumbar and hips ordered.  Provided with IM fentanyl .   [PC]  Thu Sep 01, 2024  0311  CT head negative for ICH.  CT of the cervical spine negative for acute fracture.  X-ray of the hip was negative.  X-ray of the lumbar spine showed bony abnormalities.  CT of the lumbar spine ordered and notable for L2 compression fracture with retropulsion.   Will order MRI to assess for any cord compression.  On exam patient has intact sensation to saddle region.  Patient does have strong brisk movement in bilateral lower extremities however exam is limited due to effort and pain.  Will call medicine for  admission.   [PC]  [phone]  Spoke with Dr. Alfornia from Hospitalist service. She accepted patient for admission. Will have morning team see her and place bed request.   [PC]    Clinical Course User Index [PC] Ladarius Seubert, Raynell Moder, MD   Medical Decision Making Amount and/or Complexity of Data Reviewed Labs: ordered. Decision-making details documented in ED Course. Radiology: ordered and independent interpretation performed. Decision-making details documented in ED Course.  Risk Prescription drug management. Parenteral controlled substances. Decision regarding hospitalization.     Final Clinical Impression(s) / ED Diagnoses Final diagnoses:  Compression fracture of L2 vertebra, initial encounter [facility])     Past Medical History Past Medical History:  Diagnosis Date   Abdominal pain    Allergic rhinitis    Anxiety 10/03/2013   Arthralgia 03/31/2014   Bronchitis 09/09/2013   Cellulitis    Diverticulitis    Diverticulosis    Fatigue    GERD (gastroesophageal reflux disease)    Gout    Headache    Headache    Hyperchloremia    Hypercholesterolemia    Hypertension    Hypothyroidism    Lumbar strain    Major depressive disorder    Migraine    Osteoporosis    Rheumatoid arthritis (HCC)    Scoliosis    Sleep apnea    Patient Active Problem List   Diagnosis Date Noted   Hypertension 03/21/2021   Thyroid  disease 03/21/2021   Arthritis 03/21/2021  Dysphagia 05/03/2019   Idiopathic small fiber peripheral neuropathy 12/31/2017   Carpal tunnel syndrome, left upper limb 11/19/2017   Idiopathic polyneuropathy 11/19/2017   Varicose veins of left lower extremity with complications 07/08/2016   Headache 04/08/2014   Joint pain 04/08/2014   Other malaise and fatigue 04/08/2014   Neuropathy 04/08/2014   Achalasia 04/03/2014   History of colonic polyps 04/03/2014   GERD (gastroesophageal reflux disease) 04/03/2014   Home  Medication(s) Prior to Admission medications  Medication Sig Start Date End Date Taking? Authorizing Provider  acetaminophen  (TYLENOL ) 500 MG tablet Take 1,000 mg by mouth every 6 (six) hours as needed for mild pain or moderate pain.    [provider]  atorvastatin  (LIPITOR) 40 MG tablet Take 1 tablet (40 mg total) by mouth daily. 06/12/23 09/10/23  Barbaraann Darryle Ned, MD  citalopram  (CELEXA ) 10 MG tablet Take 1 tablet daily by mouth for 1 week, then increase to 2 tablets daily by mouth 08/06/23   Midge Golas, MD  Cyanocobalamin  (VITAMIN B-12 PO) Take 1,000 mcg by mouth daily. Patient not taking: Reported on 06/12/2023    [provider]  donepezil  (ARICEPT ) 5 MG tablet Take 5 mg by mouth daily. 07/13/23   [provider]  famotidine  (PEPCID ) 40 MG tablet TAKE 1 TABLET BY MOUTH AT BEDTIME Patient taking differently: Take 40 mg by mouth daily as needed for indigestion. 01/01/21   Golda Claudis PENNER, MD  fexofenadine (ALLEGRA ALLERGY) 180 MG tablet Take 180 mg by mouth daily.    [provider]  hydrochlorothiazide  (HYDRODIURIL ) 12.5 MG tablet Take 12.5 mg by mouth daily.    [provider]  Anselm Oil 500 MG CAPS Take 500 mg by mouth daily. Patient not taking: Reported on 06/12/2023    [provider]  LAGEVRIO  200 MG CAPS capsule Take 4 capsules by mouth 2 (two) times daily. 07/30/23   [provider]  levothyroxine  (SYNTHROID , LEVOTHROID) 100 MCG tablet Take 100 mcg by mouth daily before breakfast.    [provider]  Melatonin 10 MG TABS Take by mouth at bedtime.    [provider]  Multiple Minerals-Vitamins (CAL MAG ZINC +D3) TABS Take 1 tablet by mouth daily. Patient not taking: Reported on 06/12/2023    [provider]  Multiple Vitamins-Minerals (ABC COMPLETE SENIOR WOMENS 50+ PO) Take 1 capsule by mouth daily with breakfast.    [provider]  Multiple Vitamins-Minerals (PRESERVISION/LUTEIN  PO) Take 1 tablet by mouth 2 (two) times daily. Patient not taking: Reported on 06/12/2023    [provider]  nitrofurantoin, macrocrystal-monohydrate, (MACROBID) 100 MG capsule Take 100 mg by mouth 2 (two) times daily. 07/23/23   [provider]  Omega-3 1400 MG CAPS Take 1,400 mg by mouth daily. Patient not taking: Reported on 06/12/2023    [provider]  predniSONE  (STERAPRED UNI-PAK 21 TAB) 10 MG (21) TBPK tablet Take by mouth daily. Take 6 tabs by mouth daily  for 2 days, then 5 tabs for 2 days, then 4 tabs for 2 days, then 3 tabs for 2 days, 2 tabs for 2 days, then 1 tab by mouth daily for 2 days 02/19/24   Donnajean Lynwood DEL, PA-C  Probiotic Product (PROBIOTIC DAILY PO) Take 1 tablet by mouth daily.    [provider]  traMADol  (ULTRAM ) 50 MG tablet Take 1 tablet (50 mg total) by mouth every 6 (six) hours as needed. Patient taking differently: Take 50 mg by mouth every 6 (six) hours  as needed for moderate pain (pain score 4-6). 02/12/15   Ines Onetha NOVAK, MD  triamcinolone cream (KENALOG) 0.1 % Apply 1 application. topically daily as needed (In ear after shower).    [provider]  verapamil  (CALAN -SR) 180 MG CR tablet Take 180 mg by mouth daily. 06/04/23   [provider]  verapamil  (CALAN -SR) 240 MG CR tablet Take 240 mg by mouth daily. Patient not taking: Reported on 06/12/2023 06/07/19   [provider]                                                                                                                                    Allergies Dexmedetomidine, Ibuprofen, Zyrtec [cetirizine], and Flexeril  [cyclobenzaprine ]  Review of Systems Review of Systems As noted in HPI  Physical Exam Vital Signs  I have reviewed the triage vital signs BP 107/62 (BP Location: Right Arm)   Pulse 84   Temp 98.9 F (37.2 C) (Oral)   Resp 15   SpO2 97%   Physical Exam Constitutional:      General: She is not in acute distress.     Appearance: She is well-developed. She is not diaphoretic.  HENT:     Head: Normocephalic. Contusion present.      Right Ear: External ear normal.     Left Ear: External ear normal.     Nose: Nose normal.  Eyes:     General: No scleral icterus.       Right eye: No discharge.        Left eye: No discharge.     Conjunctiva/sclera: Conjunctivae normal.     Pupils: Pupils are equal, round, and reactive to light.  Cardiovascular:     Rate and Rhythm: Normal rate and regular rhythm.     Pulses:          Radial pulses are 2+ on the right side and 2+ on the left side.       Dorsalis pedis pulses are 2+ on the right side and 2+ on the left side.     Heart sounds: Normal heart sounds. No murmur heard.    No friction rub. No gallop.  Pulmonary:     Effort: Pulmonary effort is normal. No respiratory distress.     Breath sounds: Normal breath sounds. No stridor. No wheezing.  Abdominal:     General: There is no distension.     Palpations: Abdomen is soft.     Tenderness: There is no abdominal tenderness.  Musculoskeletal:     Cervical back: Normal range of motion and neck supple. No bony tenderness.     Thoracic back: No bony tenderness.     Lumbar back: Tenderness and bony tenderness present.     Comments: Clavicles stable. Chest stable to AP/Lat compression. Pelvis stable to Lat compression. No obvious extremity deformity. No chest or abdominal wall contusion.  Skin:    General: Skin is warm  and dry.     Findings: No erythema or rash.  Neurological:     Mental Status: She is alert and oriented to person, place, and time.     Comments: Spine Exam: Strength: limited 2/2 pain and effort. She does have strong and brisk movements of BLE (including hip flexion, knee flexion, and dorsiflexion) with noxious stimuli Sensation: Intact to light touch in proximal and distal LE bilaterally Reflexes: 1+ quadriceps       ED Results and Treatments Labs (all labs ordered are listed, but only  abnormal results are displayed) Labs Reviewed  CBC WITH DIFFERENTIAL/PLATELET - Abnormal; Notable for the following components:      Result Value   RBC 3.67 (*)    Hemoglobin 11.6 (*)    HCT 35.5 (*)    Platelets 147 (*)    All other components within normal limits  BASIC METABOLIC PANEL WITH GFR - Abnormal; Notable for the following components:   Potassium 3.0 (*)    Glucose, Bld 103 (*)    Calcium  10.5 (*)    All other components within normal limits                                                                                                                         EKG  EKG Interpretation Date/Time:    Ventricular Rate:    PR Interval:    QRS Duration:    QT Interval:    QTC Calculation:   R Axis:      Text Interpretation:         Radiology CT Lumbar Spine Wo Contrast Result Date: 09/01/2024 EXAM: CT OF THE LUMBAR SPINE WITHOUT CONTRAST 09/01/2024 01:49:04 AM TECHNIQUE: CT of the lumbar spine was performed without the administration of intravenous contrast. Multiplanar reformatted images are provided for review. Automated exposure control, iterative reconstruction, and/or weight based adjustment of the mA/kV was utilized to reduce the radiation dose to as low as reasonably achievable. COMPARISON: X-ray lumbar spine 08/31/2024. CLINICAL HISTORY: Low back pain, trauma. FINDINGS: BONES AND ALIGNMENT: Grade 1 anterolisthesis of L2 on L3. Acute fracture of the L2 level involving the superior and inferior endplates as well as anterior and posterior walls. Associated 8 mm retropulsion into the central canal. Associated at least 35% vertebral body height loss centrally. Grade 2 anterolisthesis of L4 on L5. Acute fracture of the L4 level involving the anterior superior corner. At least mild to moderate  osseous central canal stenosis at the L2 level. No severe osseous neural foraminal or central canal stenosis. DEGENERATIVE CHANGES: Views to decrease bone density. Multilevel degenerative  changes of the spine most prominent at the L4-L5, L5-S1 levels with intervertebral disc space vacuum phenomenon as well as endplate sclerosis. SOFT TISSUES: Atherosclerotic plaque of the aorta. Colonic diverticulosis. IMPRESSION: 1. Acute L2 complete burst fracture with 8 mm retropulsion into the central canal and at least 35% central height loss. Recommend MRI of the lumbar spine for further evaluation. 2. Acute fracture of  the L4 vertebral body involving the anterior superior corner. 3. Diffusely decreased bone density. Electronically signed by: Morgane Naveau MD 09/01/2024 01:57 AM EST RP Workstation: [id number]   CT CERVICAL SPINE WO CONTRAST Result Date: 09/01/2024 EXAM: CT CERVICAL SPINE WITHOUT CONTRAST 09/01/2024 12:06:44 AM TECHNIQUE: CT of the cervical spine was performed without the administration of intravenous contrast. Multiplanar reformatted images are provided for review. Automated exposure control, iterative reconstruction, and/or weight based adjustment of the mA/kV was utilized to reduce the radiation dose to as low as reasonably achievable. COMPARISON: 02/09/2024. CLINICAL HISTORY: Recent fall with neck pain. FINDINGS: BONES AND ALIGNMENT: 7 cervical segments are well visualized. Vertebral body height is well maintained. Degenerative anterolisthesis of C2 on C3 is seen. This is stable from the prior exam. No acute fracture or acute facet abnormality is noted. DEGENERATIVE CHANGES: Osteophytic changes are noted with facet hypertrophic changes. SOFT TISSUES: No prevertebral soft tissue swelling. Surrounding soft tissue structures are within normal limits. LUNG APICES: Visualized lung apices demonstrate some scarring in the right apex. IMPRESSION: 1. Degenerative change without acute abnormality. Electronically signed by: Oneil Devonshire MD 09/01/2024 12:27 AM EST RP Workstation: [id number]   CT HEAD WO CONTRAST ( ) Result Date: 09/01/2024 EXAM: CT HEAD WITHOUT CONTRAST 09/01/2024 12:06:44 AM  TECHNIQUE: CT of the head was performed without the administration of intravenous contrast. Automated exposure control, iterative reconstruction, and/or weight based adjustment of the mA/kV was utilized to reduce the radiation dose to as low as reasonably achievable. COMPARISON: 02/09/2024 CLINICAL HISTORY: Recent falls with headaches FINDINGS: BRAIN AND VENTRICLES: No acute hemorrhage. No evidence of acute infarct. No extra-axial collection. No mass effect or midline shift. Age-related cerebral volume loss and mild periventricular white matter disease. Mild calcific atheromatous disease. ORBITS: No acute abnormality. Status post bilateral lens replacement. SINUSES: No acute abnormality. SOFT TISSUES AND SKULL: Mild posterior scalp soft tissue swelling. No skull fracture. IMPRESSION: 1. Chronic atrophic and ischemic changes without acute abnormality. 2. Mild posterior scalp soft tissue swelling, possibly related to the reported head trauma. Electronically signed by: Oneil Devonshire MD 09/01/2024 12:24 AM EST RP Workstation: [id number]   DG Lumbar Spine 2-3 Views Result Date: 09/01/2024 EXAM: 2 OR 3 VIEW(S) XRAY OF THE LUMBAR SPINE 08/31/2024 11:47:00 PM COMPARISON: 03/21/21 CLINICAL HISTORY: fall with low back pain FINDINGS: LUMBAR SPINE: BONES: 5 non-weightbearing lumbar vertebrae are seen. Mild vertebral body height loss is noted at L2 and L4, new from the prior exam. This is of uncertain chronicity and CT may be helpful for further evaluation. DISCS AND DEGENERATIVE CHANGES: Anterolisthesis of L4 on L5 is noted, similar to that seen on the prior exam. SOFT TISSUES: No acute abnormality. IMPRESSION: 1. Impression deformity is at L2 and L4, new from the prior study from 2022 but of uncertain chronicity. CT may be helpful. Electronically signed by: Oneil Devonshire MD 09/01/2024 12:03 AM EST RP Workstation: [id number]   DG Hips Bilat W or Wo Pelvis 2 Views Result Date: 08/31/2024 EXAM: 2 VIEW(S) XRAY OF THE PELVIS AND  BILATERAL HIP 08/31/2024 11:47:00 PM COMPARISON: None available. CLINICAL HISTORY: Recent fall with hip pain. FINDINGS: BONES AND JOINTS: Pelvic ring is intact. Sclerosis is noted along the left lesser trochanter and extending into the femoral neck, likely chronic in nature, although no prior examination is available for comparison. No acute fracture is seen. Bilateral hips demonstrate normal alignment. SOFT TISSUES: Considerable retained fecal material is noted, consistent with degree of constipation. IMPRESSION: 1. No acute fracture. 2. Sclerosis along the left lesser trochanter  extending into the femoral neck, likely chronic in nature; no prior examination is available for comparison. Electronically signed by: Oneil Devonshire MD 08/31/2024 11:59 PM EST RP Workstation: [id number]    Medications Ordered in ED Medications  fentaNYL  (SUBLIMAZE ) injection 50 mcg (50 mcg Intramuscular Given 09/01/24 0031)   Procedures Procedures  (including critical care time)   This chart was dictated using voice recognition software.  Despite best efforts to proofread,  errors can occur which can change the documentation meaning.   Trine Raynell Moder, MD 09/01/24 0321  "

## 2024-09-01 ENCOUNTER — Emergency Department (HOSPITAL_COMMUNITY)

## 2024-09-01 ENCOUNTER — Encounter (HOSPITAL_COMMUNITY): Payer: Self-pay

## 2024-09-01 ENCOUNTER — Other Ambulatory Visit: Payer: Self-pay

## 2024-09-01 DIAGNOSIS — S32020A Wedge compression fracture of second lumbar vertebra, initial encounter for closed fracture: Secondary | ICD-10-CM | POA: Diagnosis not present

## 2024-09-01 LAB — BASIC METABOLIC PANEL WITH GFR
Anion gap: 12 (ref 5–15)
BUN: 19 mg/dL (ref 8–23)
CO2: 27 mmol/L (ref 22–32)
Calcium: 10.5 mg/dL — ABNORMAL HIGH (ref 8.9–10.3)
Chloride: 106 mmol/L (ref 98–111)
Creatinine, Ser: 0.74 mg/dL (ref 0.44–1.00)
GFR, Estimated: >60 mL/min
Glucose, Bld: 103 mg/dL — ABNORMAL HIGH (ref 70–99)
Potassium: 3 mmol/L — ABNORMAL LOW (ref 3.5–5.1)
Sodium: 145 mmol/L (ref 135–145)

## 2024-09-01 LAB — CBC WITH DIFFERENTIAL/PLATELET
Abs Immature Granulocytes: 0.05 10*3/uL (ref 0.00–0.07)
Basophils Absolute: 0 10*3/uL (ref 0.0–0.1)
Basophils Relative: 0 %
Eosinophils Absolute: 0 10*3/uL (ref 0.0–0.5)
Eosinophils Relative: 0 %
HCT: 35.5 % — ABNORMAL LOW (ref 36.0–46.0)
Hemoglobin: 11.6 g/dL — ABNORMAL LOW (ref 12.0–15.0)
Immature Granulocytes: 1 %
Lymphocytes Relative: 40 %
Lymphs Abs: 4 10*3/uL (ref 0.7–4.0)
MCH: 31.6 pg (ref 26.0–34.0)
MCHC: 32.7 g/dL (ref 30.0–36.0)
MCV: 96.7 fL (ref 80.0–100.0)
Monocytes Absolute: 0.5 10*3/uL (ref 0.1–1.0)
Monocytes Relative: 5 %
Neutro Abs: 5.4 10*3/uL (ref 1.7–7.7)
Neutrophils Relative %: 54 %
Platelets: 147 10*3/uL — ABNORMAL LOW (ref 150–400)
RBC: 3.67 MIL/uL — ABNORMAL LOW (ref 3.87–5.11)
RDW: 13.7 % (ref 11.5–15.5)
WBC: 9.9 10*3/uL (ref 4.0–10.5)
nRBC: 0.2 % (ref 0.0–0.2)

## 2024-09-01 MED ORDER — ONDANSETRON HCL 4 MG PO TABS: 4.0000 mg | ORAL_TABLET | Freq: Four times a day (QID) | ORAL | Status: AC | PRN

## 2024-09-01 MED ORDER — HYDROMORPHONE HCL 1 MG/ML IJ SOLN
0.5000 mg | INTRAMUSCULAR | Status: DC | PRN
  Administered 2024-09-01 – 2024-09-02 (×2): 0.5 mg via INTRAVENOUS
  Filled 2024-09-01: qty 0.5
  Filled 2024-09-01: qty 1

## 2024-09-01 MED ORDER — TRAZODONE HCL 50 MG PO TABS
25.0000 mg | ORAL_TABLET | Freq: Every evening | ORAL | Status: AC | PRN
  Administered 2024-09-03 (×2): 25 mg via ORAL
  Filled 2024-09-01 (×2): qty 1

## 2024-09-01 MED ORDER — ALBUTEROL SULFATE (2.5 MG/3ML) 0.083% IN NEBU: 2.5000 mg | INHALATION_SOLUTION | RESPIRATORY_TRACT | Status: AC | PRN

## 2024-09-01 MED ORDER — ACETAMINOPHEN 650 MG RE SUPP: 650.0000 mg | Freq: Four times a day (QID) | RECTAL | Status: DC | PRN

## 2024-09-01 MED ORDER — POTASSIUM CHLORIDE CRYS ER 20 MEQ PO TBCR
40.0000 meq | EXTENDED_RELEASE_TABLET | Freq: Once | ORAL | Status: AC
  Administered 2024-09-01: 40 meq via ORAL
  Filled 2024-09-01: qty 2

## 2024-09-01 MED ORDER — LIDOCAINE 5 % EX PTCH
1.0000 | MEDICATED_PATCH | CUTANEOUS | Status: DC
  Administered 2024-09-01 – 2024-09-08 (×7): 1 via TRANSDERMAL
  Filled 2024-09-01 (×7): qty 1

## 2024-09-01 MED ORDER — OXYCODONE HCL 5 MG PO TABS
5.0000 mg | ORAL_TABLET | ORAL | Status: AC | PRN
  Administered 2024-09-02 – 2024-09-03 (×3): 5 mg via ORAL
  Filled 2024-09-01 (×3): qty 1

## 2024-09-01 MED ORDER — ONDANSETRON HCL 4 MG/2ML IJ SOLN
4.0000 mg | Freq: Four times a day (QID) | INTRAMUSCULAR | Status: DC | PRN
  Administered 2024-09-07: 4 mg via INTRAVENOUS
  Filled 2024-09-01: qty 2

## 2024-09-01 MED ORDER — ACETAMINOPHEN 325 MG PO TABS
650.0000 mg | ORAL_TABLET | Freq: Four times a day (QID) | ORAL | Status: DC | PRN
  Administered 2024-09-04 – 2024-09-07 (×2): 650 mg via ORAL
  Filled 2024-09-01 (×2): qty 2

## 2024-09-01 NOTE — ED Notes (Signed)
 Patient cleaned from having a bowel incontinence. Put into gown, clothing put in belonging bag beside bed.

## 2024-09-01 NOTE — Progress Notes (Signed)
 Orthopedic Tech Progress Note Patient Details:  Megan Daniel 1936/05/30 [id number]  Ortho Devices Type of Ortho Device: Thoracolumbar corset (TLSO) Ortho Device/Splint Location: TLSO placed in room. awiting PT eval. Patient was incontinent so tlso not applied Ortho Device/Splint Interventions: Ordered, Adjustment      [name] Loving 09/01/2024, 4:20 PM

## 2024-09-01 NOTE — ED Notes (Signed)
 Patient provided with lunch tray

## 2024-09-01 NOTE — Progress Notes (Signed)
 Attempted to notify [facility], left message on vm.

## 2024-09-01 NOTE — Progress Notes (Addendum)
 89 y/o F w/ hx dementia, RA who had a GLF at her SNF. CT shows L2 burst fracture with ~30% loss of height and small amount of retropulsion. Also with small anterior superior L4 chip fracture. Also with L4-S1 degenerative disc disease which is not traumatic. Reportedly no neurologic deficits.  Recommendations: TLSO brace, ambulation, pain control Please obtain upright thoracolumbar x-ray in the brace after the brace arrives If pain control is inadequate with a brace and analgesia, then would consider getting MRI scan. Can hold off on MRI scan if she seems to be tolerating the brace Follow-up with me in 4-6 weeks. Information left in chart and message sent to office

## 2024-09-01 NOTE — H&P (Addendum)
 " History and Physical  Megan Daniel FMW:[id number] DOB: 1936-01-27 DOA: 08/31/2024  PCP: Patient, No Pcp Per   Chief Complaint: Fall, back pain  HPI: Megan Daniel is a 89 y.o. female with medical history significant for dementia, hypertension, hypothyroidism, RA who suffered a mechanical fall at her facility last evening and is being admitted to the hospital for L2 compression fracture.  Patient has a history of dementia but is able to give a good history, states that she was in the bathroom at her nursing facility, she spilled water  and fell backwards while trying to clean up the spill.  She hit the back of her head, had some discomfort on the back of her head, and especially low back pain.  Workup as detailed below shows evidence of L2 compression fracture with a small amount of retropulsion.  Currently she is comfortable, but has significant lower back pain without radiation with any movement.  Review of Systems: Please see HPI for pertinent positives and negatives. A complete 10 system review of systems are otherwise negative.  Past Medical History:  Diagnosis Date   Abdominal pain    Allergic rhinitis    Anxiety 10/03/2013   Arthralgia 03/31/2014   Bronchitis 09/09/2013   Cellulitis    Diverticulitis    Diverticulosis    Fatigue    GERD (gastroesophageal reflux disease)    Gout    Headache    Headache    Hyperchloremia    Hypercholesterolemia    Hypertension    Hypothyroidism    Lumbar strain    Major depressive disorder    Migraine    Osteoporosis    Rheumatoid arthritis (HCC)    Scoliosis    Sleep apnea    Past Surgical History:  Procedure Laterality Date   CARPAL TUNNEL RELEASE     25 years ago   CARPAL TUNNEL RELEASE Left 05/17/2021   Procedure: CARPAL TUNNEL RELEASE;  Surgeon: Margrette Taft BRAVO, MD;  Location: [facility];  Service: Orthopedics;  Laterality: Left;  pt knows to arrive at 10:45   CHOLECYSTECTOMY     20 yrs ago   COLONOSCOPY      COLONOSCOPY N/A 10/25/2014   Procedure: COLONOSCOPY;  Surgeon: Claudis RAYMOND Rivet, MD;  Location: [facility];  Service: Endoscopy;  Laterality: N/A;  830   ESOPHAGEAL DILATION N/A 06/17/2019   Procedure: ESOPHAGEAL DILATION;  Surgeon: Rivet Claudis RAYMOND, MD;  Location: [facility];  Service: Endoscopy;  Laterality: N/A;   ESOPHAGOGASTRODUODENOSCOPY N/A 10/25/2014   Procedure: ESOPHAGOGASTRODUODENOSCOPY (EGD);  Surgeon: Claudis RAYMOND Rivet, MD;  Location: [facility];  Service: Endoscopy;  Laterality: N/A;   ESOPHAGOGASTRODUODENOSCOPY (EGD) WITH PROPOFOL  N/A 06/17/2019   Procedure: ESOPHAGOGASTRODUODENOSCOPY (EGD) WITH PROPOFOL ;  Surgeon: Rivet Claudis RAYMOND, MD;  Location: [facility];  Service: Endoscopy;  Laterality: N/A;  130pm   HEMORROIDECTOMY     2005   POLYPECTOMY     TOTAL ABDOMINAL HYSTERECTOMY     Age 51   UPPER GASTROINTESTINAL ENDOSCOPY     Social History:  reports that she quit smoking about 9 years ago. Her smoking use included cigarettes. She started smoking about 24 years ago. She has a 7.5 pack-year smoking history. She has never been exposed to tobacco smoke. She has never used smokeless tobacco. She reports that she does not currently use alcohol . She reports that she does not use drugs.  Allergies[1]  Family History  Problem Relation Age of Onset   Heart disease Mother  Alzheimer's disease Father    Alzheimer's disease Sister    Macular degeneration Sister    Arthritis Sister    Arthritis Sister    Seizures Sister    Alcohol  abuse Sister    COPD Sister    Healthy Brother    Seizures Other        nephew   Liver cancer Other      Prior to Admission medications  Medication Sig Start Date End Date Taking? Authorizing Provider  acetaminophen  (TYLENOL ) 500 MG tablet Take 1,000 mg by mouth every 6 (six) hours as needed for mild pain or moderate pain.    [provider]  atorvastatin  (LIPITOR) 40 MG tablet Take 1 tablet (40 mg total) by mouth daily. 06/12/23  09/10/23  Barbaraann Darryle Ned, MD  citalopram  (CELEXA ) 10 MG tablet Take 1 tablet daily by mouth for 1 week, then increase to 2 tablets daily by mouth 08/06/23   Midge Golas, MD  Cyanocobalamin  (VITAMIN B-12 PO) Take 1,000 mcg by mouth daily. Patient not taking: Reported on 06/12/2023    [provider]  donepezil  (ARICEPT ) 5 MG tablet Take 5 mg by mouth daily. 07/13/23   [provider]  famotidine  (PEPCID ) 40 MG tablet TAKE 1 TABLET BY MOUTH AT BEDTIME Patient taking differently: Take 40 mg by mouth daily as needed for indigestion. 01/01/21   Golda Claudis PENNER, MD  fexofenadine (ALLEGRA ALLERGY) 180 MG tablet Take 180 mg by mouth daily.    [provider]  hydrochlorothiazide  (HYDRODIURIL ) 12.5 MG tablet Take 12.5 mg by mouth daily.    [provider]  Anselm Oil 500 MG CAPS Take 500 mg by mouth daily. Patient not taking: Reported on 06/12/2023    [provider]  LAGEVRIO  200 MG CAPS capsule Take 4 capsules by mouth 2 (two) times daily. 07/30/23   [provider]  levothyroxine  (SYNTHROID , LEVOTHROID) 100 MCG tablet Take 100 mcg by mouth daily before breakfast.    [provider]  Melatonin 10 MG TABS Take by mouth at bedtime.    [provider]  Multiple Minerals-Vitamins (CAL MAG ZINC +D3) TABS Take 1 tablet by mouth daily. Patient not taking: Reported on 06/12/2023    [provider]  Multiple Vitamins-Minerals (ABC COMPLETE SENIOR WOMENS 50+ PO) Take 1 capsule by mouth daily with breakfast.    [provider]  Multiple Vitamins-Minerals (PRESERVISION/LUTEIN PO) Take 1 tablet by mouth 2 (two) times daily. Patient not taking: Reported on 06/12/2023    [provider]  nitrofurantoin, macrocrystal-monohydrate, (MACROBID) 100 MG capsule Take 100 mg by mouth 2 (two) times daily. 07/23/23   [provider]  Omega-3 1400 MG CAPS Take 1,400 mg by mouth daily. Patient not taking: Reported on  06/12/2023    [provider]  predniSONE  (STERAPRED UNI-PAK 21 TAB) 10 MG (21) TBPK tablet Take by mouth daily. Take 6 tabs by mouth daily  for 2 days, then 5 tabs for 2 days, then 4 tabs for 2 days, then 3 tabs for 2 days, 2 tabs for 2 days, then 1 tab by mouth daily for 2 days 02/19/24   Donnajean Lynwood DEL, PA-C  Probiotic Product (PROBIOTIC DAILY PO) Take 1 tablet by mouth daily.    [provider]  traMADol  (ULTRAM ) 50 MG tablet Take 1 tablet (50 mg total) by mouth every 6 (six) hours as needed. Patient taking differently: Take 50 mg by mouth every 6 (six) hours as needed for moderate pain (pain score 4-6).  02/12/15   Ines Onetha NOVAK, MD  triamcinolone cream (KENALOG) 0.1 % Apply 1 application. topically daily as needed (In ear after shower).    [provider]  verapamil  (CALAN -SR) 180 MG CR tablet Take 180 mg by mouth daily. 06/04/23   [provider]  verapamil  (CALAN -SR) 240 MG CR tablet Take 240 mg by mouth daily. Patient not taking: Reported on 06/12/2023 06/07/19   [provider]    Physical Exam: BP 121/60 (BP Location: Right Arm)   Pulse 75   Temp 97.9 F (36.6 C) (Oral)   Resp 16   SpO2 96%  General:  Alert, oriented, calm, in no acute distress, lying flat on her back Cardiovascular: RRR, no murmurs or rubs, no peripheral edema  Respiratory: clear to auscultation bilaterally, no wheezes, no crackles  Abdomen: soft, nontender, nondistended, normal bowel tones heard  Skin: dry, no rashes  Musculoskeletal: no joint effusions, lower extremity range of motion severely limited by severe low back pain Neurologic: extraocular muscles intact, clear speech, moving all extremities with intact sensorium to touch, but very limited movement in lower extremities due to pain         Labs on Admission:  Basic Metabolic Panel: Recent Labs  Lab 09/01/24 0113  NA 145  K 3.0*  CL 106  CO2 27  GLUCOSE 103*  BUN 19  CREATININE 0.74  CALCIUM   10.5*   Liver Function Tests: No results for input(s): AST, ALT, ALKPHOS, BILITOT, PROT, ALBUMIN in the last 168 hours. No results for input(s): LIPASE, AMYLASE in the last 168 hours. No results for input(s): AMMONIA in the last 168 hours. CBC: Recent Labs  Lab 09/01/24 0113  WBC 9.9  NEUTROABS 5.4  HGB 11.6*  HCT 35.5*  MCV 96.7  PLT 147*   Cardiac Enzymes: No results for input(s): CKTOTAL, CKMB, CKMBINDEX, TROPONINI in the last 168 hours. BNP (last 3 results) No results for input(s): BNP in the last 8760 hours.  ProBNP (last 3 results) No results for input(s): PROBNP in the last 8760 hours.  CBG: No results for input(s): GLUCAP in the last 168 hours.  Radiological Exams on Admission: CT Lumbar Spine Wo Contrast Result Date: 09/01/2024 EXAM: CT OF THE LUMBAR SPINE WITHOUT CONTRAST 09/01/2024 01:49:04 AM TECHNIQUE: CT of the lumbar spine was performed without the administration of intravenous contrast. Multiplanar reformatted images are provided for review. Automated exposure control, iterative reconstruction, and/or weight based adjustment of the mA/kV was utilized to reduce the radiation dose to as low as reasonably achievable. COMPARISON: X-ray lumbar spine 08/31/2024. CLINICAL HISTORY: Low back pain, trauma. FINDINGS: BONES AND ALIGNMENT: Grade 1 anterolisthesis of L2 on L3. Acute fracture of the L2 level involving the superior and inferior endplates as well as anterior and posterior walls. Associated 8 mm retropulsion into the central canal. Associated at least 35% vertebral body height loss centrally. Grade 2 anterolisthesis of L4 on L5. Acute fracture of the L4 level involving the anterior superior corner. At least mild to moderate  osseous central canal stenosis at the L2 level. No severe osseous neural foraminal or central canal stenosis. DEGENERATIVE CHANGES: Views to decrease bone density. Multilevel degenerative changes of the spine most  prominent at the L4-L5, L5-S1 levels with intervertebral disc space vacuum phenomenon as well as endplate sclerosis. SOFT TISSUES: Atherosclerotic plaque of the aorta. Colonic diverticulosis. IMPRESSION: 1. Acute L2 complete burst fracture with 8 mm retropulsion into the central canal and at least 35% central height loss. Recommend MRI of the  lumbar spine for further evaluation. 2. Acute fracture of the L4 vertebral body involving the anterior superior corner. 3. Diffusely decreased bone density. Electronically signed by: Morgane Naveau MD 09/01/2024 01:57 AM EST RP Workstation: [id number]   CT CERVICAL SPINE WO CONTRAST Result Date: 09/01/2024 EXAM: CT CERVICAL SPINE WITHOUT CONTRAST 09/01/2024 12:06:44 AM TECHNIQUE: CT of the cervical spine was performed without the administration of intravenous contrast. Multiplanar reformatted images are provided for review. Automated exposure control, iterative reconstruction, and/or weight based adjustment of the mA/kV was utilized to reduce the radiation dose to as low as reasonably achievable. COMPARISON: 02/09/2024. CLINICAL HISTORY: Recent fall with neck pain. FINDINGS: BONES AND ALIGNMENT: 7 cervical segments are well visualized. Vertebral body height is well maintained. Degenerative anterolisthesis of C2 on C3 is seen. This is stable from the prior exam. No acute fracture or acute facet abnormality is noted. DEGENERATIVE CHANGES: Osteophytic changes are noted with facet hypertrophic changes. SOFT TISSUES: No prevertebral soft tissue swelling. Surrounding soft tissue structures are within normal limits. LUNG APICES: Visualized lung apices demonstrate some scarring in the right apex. IMPRESSION: 1. Degenerative change without acute abnormality. Electronically signed by: Oneil Devonshire MD 09/01/2024 12:27 AM EST RP Workstation: [id number]   CT HEAD WO CONTRAST ( ) Result Date: 09/01/2024 EXAM: CT HEAD WITHOUT CONTRAST 09/01/2024 12:06:44 AM TECHNIQUE: CT of the head  was performed without the administration of intravenous contrast. Automated exposure control, iterative reconstruction, and/or weight based adjustment of the mA/kV was utilized to reduce the radiation dose to as low as reasonably achievable. COMPARISON: 02/09/2024 CLINICAL HISTORY: Recent falls with headaches FINDINGS: BRAIN AND VENTRICLES: No acute hemorrhage. No evidence of acute infarct. No extra-axial collection. No mass effect or midline shift. Age-related cerebral volume loss and mild periventricular white matter disease. Mild calcific atheromatous disease. ORBITS: No acute abnormality. Status post bilateral lens replacement. SINUSES: No acute abnormality. SOFT TISSUES AND SKULL: Mild posterior scalp soft tissue swelling. No skull fracture. IMPRESSION: 1. Chronic atrophic and ischemic changes without acute abnormality. 2. Mild posterior scalp soft tissue swelling, possibly related to the reported head trauma. Electronically signed by: Oneil Devonshire MD 09/01/2024 12:24 AM EST RP Workstation: [id number]   DG Lumbar Spine 2-3 Views Result Date: 09/01/2024 EXAM: 2 OR 3 VIEW(S) XRAY OF THE LUMBAR SPINE 08/31/2024 11:47:00 PM COMPARISON: 03/21/21 CLINICAL HISTORY: fall with low back pain FINDINGS: LUMBAR SPINE: BONES: 5 non-weightbearing lumbar vertebrae are seen. Mild vertebral body height loss is noted at L2 and L4, new from the prior exam. This is of uncertain chronicity and CT may be helpful for further evaluation. DISCS AND DEGENERATIVE CHANGES: Anterolisthesis of L4 on L5 is noted, similar to that seen on the prior exam. SOFT TISSUES: No acute abnormality. IMPRESSION: 1. Impression deformity is at L2 and L4, new from the prior study from 2022 but of uncertain chronicity. CT may be helpful. Electronically signed by: Oneil Devonshire MD 09/01/2024 12:03 AM EST RP Workstation: [id number]   DG Hips Bilat W or Wo Pelvis 2 Views Result Date: 08/31/2024 EXAM: 2 VIEW(S) XRAY OF THE PELVIS AND BILATERAL HIP 08/31/2024  11:47:00 PM COMPARISON: None available. CLINICAL HISTORY: Recent fall with hip pain. FINDINGS: BONES AND JOINTS: Pelvic ring is intact. Sclerosis is noted along the left lesser trochanter and extending into the femoral neck, likely chronic in nature, although no prior examination is available for comparison. No acute fracture is seen. Bilateral hips demonstrate normal alignment. SOFT TISSUES: Considerable retained fecal material is noted, consistent with degree of constipation. IMPRESSION: 1. No  acute fracture. 2. Sclerosis along the left lesser trochanter extending into the femoral neck, likely chronic in nature; no prior examination is available for comparison. Electronically signed by: Oneil Devonshire MD 08/31/2024 11:59 PM EST RP Workstation: [id number]   Assessment/Plan Megan Daniel is a 89 y.o. female with medical history significant for dementia, hypertension, hypothyroidism, RA who suffered a mechanical fall at her facility last evening and is being admitted to the hospital for L2 compression fracture.   Acute L2 compression fracture-burst fracture with 8 mm retropulsion, patient appears to be neurologically intact without clinical evidence of spinal cord compression. -Inpatient admission -Consulted neurosurgery Dr. Darnella for recommendations versus intervention, he recommends the following -TLSO brace, PT/OT -Consider MRI and possible intervention if unable to ambulate -Pain and nausea control as needed  Hypertension-HCTZ, verapamil   Hypothyroidism-Synthroid   Hyperlipidemia-Lipitor  Dementia-Aricept   DVT prophylaxis: SCDs only    Code Status: Full Code  Consults called: Neurosurgery  Admission status: The appropriate patient status for this patient is INPATIENT. Inpatient status is judged to be reasonable and necessary in order to provide the required intensity of service to ensure the patient's safety. The patient's presenting symptoms, physical exam findings, and initial  radiographic and laboratory data in the context of their chronic comorbidities is felt to place them at high risk for further clinical deterioration. Furthermore, it is not anticipated that the patient will be medically stable for discharge from the hospital within 2 midnights of admission.    I certify that at the point of admission it is my clinical judgment that the patient will require inpatient hospital care spanning beyond 2 midnights from the point of admission due to high intensity of service, high risk for further deterioration and high frequency of surveillance required  Time spent: 53 minutes  Kamora Vossler CHRISTELLA Gail MD [facility] Pager [phone]  If 7PM-7AM, please contact night-coverage [email or web address] Password [email or web address]  09/01/2024, 8:25 AM      [1]  Allergies Allergen Reactions   Dexmedetomidine    Ibuprofen Other (See Comments)    bad heartburn   Zyrtec [Cetirizine]    Flexeril  [Cyclobenzaprine ] Anxiety   "

## 2024-09-01 NOTE — ED Notes (Signed)
"  Patient provided with breakfast tray   "

## 2024-09-02 ENCOUNTER — Inpatient Hospital Stay (HOSPITAL_COMMUNITY)

## 2024-09-02 ENCOUNTER — Encounter (HOSPITAL_COMMUNITY): Payer: Self-pay | Admitting: Internal Medicine

## 2024-09-02 DIAGNOSIS — S32020A Wedge compression fracture of second lumbar vertebra, initial encounter for closed fracture: Secondary | ICD-10-CM | POA: Diagnosis not present

## 2024-09-02 LAB — BASIC METABOLIC PANEL WITH GFR
Anion gap: 6 (ref 5–15)
BUN: 17 mg/dL (ref 8–23)
CO2: 28 mmol/L (ref 22–32)
Calcium: 9.6 mg/dL (ref 8.9–10.3)
Chloride: 106 mmol/L (ref 98–111)
Creatinine, Ser: 0.62 mg/dL (ref 0.44–1.00)
GFR, Estimated: >60 mL/min
Glucose, Bld: 115 mg/dL — ABNORMAL HIGH (ref 70–99)
Potassium: 3.9 mmol/L (ref 3.5–5.1)
Sodium: 140 mmol/L (ref 135–145)

## 2024-09-02 LAB — CBC
HCT: 30.9 % — ABNORMAL LOW (ref 36.0–46.0)
Hemoglobin: 10 g/dL — ABNORMAL LOW (ref 12.0–15.0)
MCH: 31.2 pg (ref 26.0–34.0)
MCHC: 32.4 g/dL (ref 30.0–36.0)
MCV: 96.3 fL (ref 80.0–100.0)
Platelets: 116 10*3/uL — ABNORMAL LOW (ref 150–400)
RBC: 3.21 MIL/uL — ABNORMAL LOW (ref 3.87–5.11)
RDW: 13.7 % (ref 11.5–15.5)
WBC: 8.6 10*3/uL (ref 4.0–10.5)
nRBC: 0 % (ref 0.0–0.2)

## 2024-09-02 LAB — APTT: aPTT: 32 s (ref 24–36)

## 2024-09-02 LAB — PROTIME-INR
INR: 1.1 (ref 0.8–1.2)
Prothrombin Time: 14.3 s (ref 11.4–15.2)

## 2024-09-02 MED ORDER — METHOCARBAMOL 500 MG PO TABS
500.0000 mg | ORAL_TABLET | Freq: Three times a day (TID) | ORAL | Status: DC
  Administered 2024-09-02 – 2024-09-08 (×16): 500 mg via ORAL
  Filled 2024-09-02 (×17): qty 1

## 2024-09-02 MED ORDER — SENNA 8.6 MG PO TABS: 1.0000 | ORAL_TABLET | Freq: Every day | ORAL | Status: DC | PRN

## 2024-09-02 MED ORDER — RISPERIDONE 0.25 MG PO TABS
0.5000 mg | ORAL_TABLET | Freq: Every day | ORAL | Status: DC
  Administered 2024-09-02 – 2024-09-07 (×6): 0.5 mg via ORAL
  Filled 2024-09-02 (×6): qty 2

## 2024-09-02 MED ORDER — LEVOTHYROXINE SODIUM 112 MCG PO TABS
112.0000 ug | ORAL_TABLET | Freq: Every day | ORAL | Status: DC
  Administered 2024-09-03 – 2024-09-08 (×5): 112 ug via ORAL
  Filled 2024-09-02 (×5): qty 1

## 2024-09-02 MED ORDER — VERAPAMIL HCL ER 240 MG PO TBCR
240.0000 mg | EXTENDED_RELEASE_TABLET | Freq: Every day | ORAL | Status: DC
  Administered 2024-09-02 – 2024-09-05 (×4): 240 mg via ORAL
  Filled 2024-09-02 (×5): qty 1

## 2024-09-02 NOTE — Consult Note (Signed)
 Neurosurgery Consult Note  Assessment:  89 year old female with history of dementia, RA on aspirin 81 presents after fall with imaging showing acute L2 burst fracture with worsening collapse on upright x-ray and pain refractory to TLSO brace  Plan:  Can perform L2 kyphoplasty on Monday at [facility] Activity as tolerated in TLSO brace in the meantime Diet as tolerated Okay for DVT prophylaxis Hold all other anticoagulation and antiplatelets Rest of care per primary    CC: Pain  HPI:     Patient is a 89 y.o. female w/ hx of dementia, RA on aspirin 81 who fell 2 nights ago.  Patient has baseline dementia and can only report current symptoms.  She is not a reliable historian.  She complains of mid to low back pain without radiculopathy.  CT lumbar spine shows acute mild L2 burst fracture with minimal retropulsion.  Standing thoracolumbar x-ray in brace shows that the L2 fracture is more collapsed compared to her supine CT. There is no segmental kyphosis.  I recommended a TLSO brace.  However, the patient is in severe back pain that is preventing any activity or participation with therapy  I called her POA.  I explained that given the debilitating pain and worsening fracture on upright x-ray I would recommend an L2 kyphoplasty.  I explained the risk of bleeding, infection, CSF leak, cement extravasation, general anesthesia risk, damage nearby organs, mortality.  He verbalized understanding wanted to proceed   Patient Active Problem List   Diagnosis Date Noted   Closed compression fracture of L2 vertebra, initial encounter (HCC) 09/01/2024   Hypertension 03/21/2021   Thyroid  disease 03/21/2021   Arthritis 03/21/2021   Dysphagia 05/03/2019   Idiopathic small fiber peripheral neuropathy 12/31/2017   Carpal tunnel syndrome, left upper limb 11/19/2017   Idiopathic polyneuropathy 11/19/2017   Varicose veins of left lower extremity with complications 07/08/2016   Headache 04/08/2014    Joint pain 04/08/2014   Other malaise and fatigue 04/08/2014   Neuropathy 04/08/2014   Achalasia 04/03/2014   History of colonic polyps 04/03/2014   GERD (gastroesophageal reflux disease) 04/03/2014   Past Medical History:  Diagnosis Date   Abdominal pain    Allergic rhinitis    Anxiety 10/03/2013   Arthralgia 03/31/2014   Bronchitis 09/09/2013   Cellulitis    Diverticulitis    Diverticulosis    Fatigue    GERD (gastroesophageal reflux disease)    Gout    Headache    Headache    Hyperchloremia    Hypercholesterolemia    Hypertension    Hypothyroidism    Lumbar strain    Major depressive disorder    Migraine    Osteoporosis    Rheumatoid arthritis (HCC)    Scoliosis    Sleep apnea     Past Surgical History:  Procedure Laterality Date   CARPAL TUNNEL RELEASE     25 years ago   CARPAL TUNNEL RELEASE Left 05/17/2021   Procedure: CARPAL TUNNEL RELEASE;  Surgeon: Margrette Taft BRAVO, MD;  Location: [facility];  Service: Orthopedics;  Laterality: Left;  pt knows to arrive at 10:45   CHOLECYSTECTOMY     20 yrs ago   COLONOSCOPY     COLONOSCOPY N/A 10/25/2014   Procedure: COLONOSCOPY;  Surgeon: Claudis RAYMOND Rivet, MD;  Location: [facility];  Service: Endoscopy;  Laterality: N/A;  830   ESOPHAGEAL DILATION N/A 06/17/2019   Procedure: ESOPHAGEAL DILATION;  Surgeon: Rivet Claudis RAYMOND, MD;  Location: [facility];  Service:  Endoscopy;  Laterality: N/A;   ESOPHAGOGASTRODUODENOSCOPY N/A 10/25/2014   Procedure: ESOPHAGOGASTRODUODENOSCOPY (EGD);  Surgeon: Claudis RAYMOND Rivet, MD;  Location: [facility];  Service: Endoscopy;  Laterality: N/A;   ESOPHAGOGASTRODUODENOSCOPY (EGD) WITH PROPOFOL  N/A 06/17/2019   Procedure: ESOPHAGOGASTRODUODENOSCOPY (EGD) WITH PROPOFOL ;  Surgeon: Rivet Claudis RAYMOND, MD;  Location: [facility];  Service: Endoscopy;  Laterality: N/A;  130pm   HEMORROIDECTOMY     2005   POLYPECTOMY     TOTAL ABDOMINAL HYSTERECTOMY     Age 73   UPPER GASTROINTESTINAL ENDOSCOPY       Medications Prior to Admission  Medication Sig Dispense Refill Last Dose/Taking   acetaminophen  (TYLENOL ) 500 MG tablet Take 500 mg by mouth every 6 (six) hours as needed for mild pain (pain score 1-3) or moderate pain (pain score 4-6).   Unknown   aspirin EC 81 MG tablet Take 81 mg by mouth daily. Swallow whole.   08/31/2024   atorvastatin  (LIPITOR) 40 MG tablet Take 40 mg by mouth every evening.   08/30/2024   fexofenadine (ALLEGRA ALLERGY) 180 MG tablet Take 180 mg by mouth daily.   08/31/2024   furosemide (LASIX) 20 MG tablet Take 20 mg by mouth daily.   08/31/2024   levothyroxine  (SYNTHROID ) 112 MCG tablet Take 112 mcg by mouth daily before breakfast.   08/31/2024   loperamide (IMODIUM A-D) 2 MG tablet Take 2 mg by mouth every 6 (six) hours as needed for diarrhea or loose stools.   Unknown   Multiple Vitamin (MULTIVITAMIN WITH MINERALS) TABS tablet Take 1 tablet by mouth daily.   08/30/2024   risperiDONE  (RISPERDAL ) 0.5 MG tablet Take 0.5 mg by mouth at bedtime.   08/30/2024   senna (SENOKOT) 8.6 MG TABS tablet Take 1 tablet by mouth daily as needed for mild constipation or moderate constipation.   Unknown   traZODone  (DESYREL ) 50 MG tablet Take 50 mg by mouth at bedtime as needed for sleep.   Unknown   verapamil  (CALAN -SR) 240 MG CR tablet Take 240 mg by mouth at bedtime.   08/30/2024   Allergies[1]  Social History   Tobacco Use   Smoking status: Former    Current packs/day: 0.00    Average packs/day: 0.5 packs/day for 15.0 years (7.5 ttl pk-yrs)    Types: Cigarettes    Start date: 2002    Quit date: 2017    Years since quitting: 9.0    Passive exposure: Never   Smokeless tobacco: Never  Substance Use Topics   Alcohol  use: Not Currently    Comment: wine occ    Family History  Problem Relation Age of Onset   Heart disease Mother    Alzheimer's disease Father    Alzheimer's disease Sister    Macular degeneration Sister    Arthritis Sister    Arthritis Sister    Seizures  Sister    Alcohol  abuse Sister    COPD Sister    Healthy Brother    Seizures Other        nephew   Liver cancer Other      Review of Systems Pertinent items are noted in HPI.  Objective:   Patient Vitals for the past 8 hrs:  BP Temp Temp src Pulse Resp SpO2  09/02/24 0656 (!) 162/75 98.9 F (37.2 C) Oral 85 16 92 %   I/O last 3 completed shifts: In: 100 [P.O.:100] Out: 450 [Urine:450] No intake/output data recorded.    Exam: GCS 4E 4V 33M  Baseline dementia.  Can answer simple questions but is not oriented at all No aphasia or dysarthria PERRL EOMI, conjugate gaze FS TM Generalized weakness BUE and BLE 4/5 which I believe is effort related Full sensation     Data ReviewCBC:  Lab Results  Component Value Date   WBC 8.6 09/02/2024   RBC 3.21 (L) 09/02/2024   BMP:  Lab Results  Component Value Date   GLUCOSE 115 (H) 09/02/2024   CO2 28 09/02/2024   BUN 17 09/02/2024   BUN 13 06/05/2020   CREATININE 0.62 09/02/2024   CREATININE 0.67 12/20/2021   CALCIUM  9.6 09/02/2024         [1]  Allergies Allergen Reactions   Dexmedetomidine Other (See Comments)    Unknown   Ibuprofen Other (See Comments)    Heartburn   Zyrtec [Cetirizine] Other (See Comments)    Unknown   Flexeril  [Cyclobenzaprine ] Anxiety

## 2024-09-02 NOTE — TOC Initial Note (Addendum)
 Transition of Care [facility]) - Initial/Assessment Note    Patient Details  Name: Megan Daniel MRN: [id number] Date of Birth: 1936-04-24  Transition of Care [facility]) CM/SW Contact:    Doneta Glenys DASEN, RN Phone Number: 09/02/2024, 9:23 AM  Clinical Narrative:    9:36 AM Will returned call. CM requested [facility] POA paper work.          9:23 AM     [facility]. CM confirmed status. CM called [place] (NONI Soyla Lincoln, Emergency Contact [phone] - left message CM following   Expected Discharge Plan: Memory Care Barriers to Discharge: No Barriers Identified, Continued Medical Work up   Patient Goals and CMS Choice Patient states their goals for this hospitalization and ongoing recovery are:: Home to [facility].gov Compare Post Acute Care list provided to:: Patient Represenative (must comment) (Will) Choice offered to / list presented to : [facility] POA / Guardian  ownership interest in [facility].provided to:: [facility] POA / Guardian    Expected Discharge Plan and Services In-house Referral: NA Discharge Planning Services: CM Consult Post Acute Care Choice: NA Living arrangements for the past 2 months: Assisted Living Facility                 DME Arranged: N/A DME Agency: NA       HH Arranged: NA HH Agency: NA        Prior Living Arrangements/Services Living arrangements for the past 2 months: Assisted Living Facility Lives with:: Facility Resident Patient language and need for interpreter reviewed:: Yes Do you feel safe going back to the place where you live?: Yes      Need for Family Participation in Patient Care: Yes (Comment) Care giver support system in place?: Yes (comment)   Criminal Activity/Legal Involvement Pertinent to Current Situation/Hospitalization: No - Comment as needed  Activities of Daily Living   ADL Screening (condition at time of admission) Independently performs ADLs?: No Does the patient have a NEW  difficulty with bathing/dressing/toileting/self-feeding that is expected to last >3 days?: No Does the patient have a NEW difficulty with getting in/out of bed, walking, or climbing stairs that is expected to last >3 days?: Yes (Initiates electronic notice to provider for possible PT consult) Does the patient have a NEW difficulty with communication that is expected to last >3 days?: No Is the patient deaf or have difficulty hearing?: No Does the patient have difficulty seeing, even when wearing glasses/contacts?: No Does the patient have difficulty concentrating, remembering, or making decisions?: Yes  Permission Sought/Granted Permission sought to share information with : Case Manager Permission granted to share information with : Yes, Verbal Permission Granted  Share Information with NAME: Norma (NONI Soyla Lincoln, Emergency Contact  [phone]  Permission granted to share info w AGENCY: [facility]        Emotional Assessment Appearance:: Appears stated age Attitude/Demeanor/Rapport: Engaged Affect (typically observed): Appropriate Orientation: : Fluctuating Orientation (Suspected and/or reported Sundowners) Alcohol  / Substance Use: Not Applicable Psych Involvement: No (comment)  Admission diagnosis:  Compression fracture of L2 vertebra, initial encounter (HCC) [S32.020A] Closed compression fracture of L2 vertebra, initial encounter (HCC) [S32.020A] Patient Active Problem List   Diagnosis Date Noted   Closed compression fracture of L2 vertebra, initial encounter (HCC) 09/01/2024   Hypertension 03/21/2021   Thyroid  disease 03/21/2021   Arthritis 03/21/2021   Dysphagia 05/03/2019   Idiopathic small fiber peripheral neuropathy 12/31/2017   Carpal tunnel syndrome, left upper limb 11/19/2017   Idiopathic polyneuropathy  11/19/2017   Varicose veins of left lower extremity with complications 07/08/2016   Headache 04/08/2014   Joint pain 04/08/2014   Other malaise and fatigue  04/08/2014   Neuropathy 04/08/2014   Achalasia 04/03/2014   History of colonic polyps 04/03/2014   GERD (gastroesophageal reflux disease) 04/03/2014   PCP:  Patient, No Pcp Per Pharmacy:   [facility] [place] [address] [place] [address] Phone: [phone] Fax: [phone]     Social Drivers of Health (SDOH) Social History: SDOH Screenings   Food Insecurity: Patient Unable To Answer (09/01/2024)  Housing: Unknown (09/01/2024)  Transportation Needs: Patient Unable To Answer (09/01/2024)  Utilities: Patient Unable To Answer (09/01/2024)  Social Connections: Unknown (09/01/2024)  Tobacco Use: Medium Risk (09/01/2024)   SDOH Interventions:     Readmission Risk Interventions    09/02/2024    9:20 AM  Readmission Risk Prevention Plan  Post Dischage Appt Complete  Medication Screening Complete  Transportation Screening Complete

## 2024-09-02 NOTE — Plan of Care (Signed)

## 2024-09-02 NOTE — Evaluation (Signed)
 Occupational Therapy Evaluation Patient Details Name: Megan Daniel MRN: [id number] DOB: [birth year] Today's Date: 09/02/2024   History of Present Illness   89 y.o. female who suffered a mechanical fall at her facility  and is admitted to the hospital for L2 compression fracture. Pt with medical history significant for dementia, hypertension, hypothyroidism, RA     Clinical Impressions Pt admitted with above. Pt poor historian due to hx of dementia, alert only to self. Chart review indicates pt from memory care. Pt found attempting to get up out of bed without assistance. MAX A to don socks, TOTAL A to don TLSO while seated at EOB. Requires MOD A +2 for STS using RW, posterior lean and able to take pivotal steps to [facility] with MIN A +2, MAX A for pericare / clothing mgmt after void. Pt would benefit from skilled OT services to address noted impairments and functional limitations (see below for any additional details) in order to maximize safety and independence while minimizing falls risk and caregiver burden. Anticipate the need for follow up OT services upon acute hospital DC. Patient will benefit from continued inpatient follow up therapy, <3 hours/day.      If plan is discharge home, recommend the following:   Two people to help with walking and/or transfers;Two people to help with bathing/dressing/bathroom;Assistance with cooking/housework;Direct supervision/assist for medications management;Direct supervision/assist for financial management;Assistance with feeding;Help with stairs or ramp for entrance;Assist for transportation;Supervision due to cognitive status     Functional Status Assessment   Patient has had a recent decline in their functional status and demonstrates the ability to make significant improvements in function in a reasonable and predictable amount of time.     Equipment Recommendations   BSC/3in1      Precautions/Restrictions   Precautions Precautions:  Fall;Back Recall of Precautions/Restrictions: Impaired Precaution/Restrictions Comments: h/o dementia Required Braces or Orthoses: Spinal Brace Spinal Brace: Thoracolumbosacral orthotic;Applied in sitting position Restrictions Weight Bearing Restrictions Per Provider Order: No     Mobility Bed Mobility Overal bed mobility: Needs Assistance Bed Mobility: Sit to Supine       Sit to supine: Mod assist, +2 for physical assistance, +2 for safety/equipment     Patient Response: Cooperative, Flat affect  Transfers Overall transfer level: Needs assistance Equipment used: Rolling walker (2 wheels) Transfers: Sit to/from Stand, Bed to chair/wheelchair/BSC Sit to Stand: Mod assist, +2 safety/equipment, +2 physical assistance     Step pivot transfers: Min assist, +2 safety/equipment, +2 physical assistance     General transfer comment: assist for posterior lean in standing      Balance Overall balance assessment: Needs assistance   Sitting balance-Leahy Scale: Poor Sitting balance - Comments: poor safety awareness - recieved attempting to get out of bed without assistance   Standing balance support: During functional activity, Bilateral upper extremity supported, Reliant on assistive device for balance Standing balance-Leahy Scale: Poor                             ADL either performed or assessed with clinical judgement   ADL Overall ADL's : Needs assistance/impaired Eating/Feeding: Sitting;Set up   Grooming: Sitting;Set up   Upper Body Bathing: Sitting;Minimal assistance   Lower Body Bathing: Cueing for back precautions;Sit to/from stand;Sitting/lateral leans;Maximal assistance Lower Body Bathing Details (indicate cue type and reason): anticipate MAX A with cues for back precautions Upper Body Dressing : Sitting;Minimal assistance;Total assistance Upper Body Dressing Details (indicate cue type and reason): don  gown, TOTAL A for TLSO while EOB Lower Body  Dressing: Maximal assistance;Cueing for back precautions Lower Body Dressing Details (indicate cue type and reason): anticipate MAX A with cues for back precautions Toilet Transfer: Moderate assistance;+2 for physical assistance;+2 for safety/equipment;Cueing for safety;Cueing for sequencing;BSC/3in1;Rolling walker (2 wheels);Ambulation Toilet Transfer Details (indicate cue type and reason): MOD A +2 for STS, posterior lean, MIN A +2 to SPT to [facility] using RW Toileting- Clothing Manipulation and Hygiene: Maximal assistance;Sitting/lateral lean Toileting - Clothing Manipulation Details (indicate cue type and reason): MAX A in standing for pericare, requires +2 assist for trunk support     Functional mobility during ADLs: Minimal assistance;+2 for physical assistance;+2 for safety/equipment;Rolling walker (2 wheels) General ADL Comments: hx of dementia, anticipate poor carryover and recall of back precautions. pt reports no pain while walking when asked.      Pertinent Vitals/Pain Pain Assessment Pain Assessment: PAINAD Breathing: occasional labored breathing, short period of hyperventilation Negative Vocalization: occasional moan/groan, low speech, negative/disapproving quality Facial Expression: sad, frightened, frown Body Language: tense, distressed pacing, fidgeting Consolability: distracted or reassured by voice/touch PAINAD Score: 5     Extremity/Trunk Assessment Upper Extremity Assessment Upper Extremity Assessment: Generalized weakness;Difficult to assess due to impaired cognition   Lower Extremity Assessment Lower Extremity Assessment: Defer to PT evaluation   Cervical / Trunk Assessment Cervical / Trunk Assessment: Other exceptions Cervical / Trunk Exceptions: TLSO applied in sitting   Communication Communication Communication: No apparent difficulties   Cognition Arousal: Alert Behavior During Therapy: WFL for tasks assessed/performed Cognition: History of cognitive  impairments                               Following commands: Impaired Following commands impaired: Follows one step commands with increased time     Cueing  General Comments   Cueing Techniques: Tactile cues;Verbal cues              Home Living Family/patient expects to be discharged to:: Unsure                                 Additional Comments: pt not able to provide PLOF 2* dementia. Chart review indicates pt from memory care.      Prior Functioning/Environment Prior Level of Function : Patient poor historian/Family not available             Mobility Comments: anticipate AD use ADLs Comments: anticipate assist for ADLs given memory care and cognition    OT Problem List: Decreased strength;Impaired balance (sitting and/or standing);Decreased cognition;Decreased knowledge of precautions;Decreased range of motion;Decreased activity tolerance;Decreased coordination;Decreased knowledge of use of DME or AE;Decreased safety awareness   OT Treatment/Interventions: Self-care/ADL training;DME and/or AE instruction;Balance training;Therapeutic activities;Therapeutic exercise;Cognitive remediation/compensation;Neuromuscular education;Patient/family education      OT Goals(Current goals can be found in the care plan section)   Acute Rehab OT Goals OT Goal Formulation: Patient unable to participate in goal setting Time For Goal Achievement: 09/16/24 Potential to Achieve Goals: Fair   OT Frequency:  Min 2X/week    Co-evaluation   Reason for Co-Treatment: Complexity of the patient's impairments (multi-system involvement);For patient/therapist safety;To address functional/ADL transfers PT goals addressed during session: Mobility/safety with mobility;Balance        AM-PAC OT 6 Clicks Daily Activity     Outcome Measure Help from another person eating meals?: A Little Help from another person taking care  of personal grooming?: A Little Help  from another person toileting, which includes using toliet, bedpan, or urinal?: A Lot Help from another person bathing (including washing, rinsing, drying)?: A Lot Help from another person to put on and taking off regular upper body clothing?: A Lot Help from another person to put on and taking off regular lower body clothing?: A Lot 6 Click Score: 14   End of Session Equipment Utilized During Treatment: Gait belt;Rolling walker (2 wheels);Other (comment) (TLSO) Nurse Communication: Mobility status  Activity Tolerance: Patient tolerated treatment well;No increased pain Patient left: in bed;with call bell/phone within reach;with bed alarm set  OT Visit Diagnosis: Pain;Unsteadiness on feet (R26.81);Other abnormalities of gait and mobility (R26.89);History of falling (Z91.81)                Time: 8946-8878 OT Time Calculation (min): 28 min Charges:  OT General Charges $OT Visit: 1 Visit OT Evaluation $OT Eval Moderate Complexity: 1 Mod  Verneice Caspers L. Runell Kovich, OTR/L  09/02/24, 12:57 PM

## 2024-09-02 NOTE — Progress Notes (Signed)
" °   09/02/24 1703  Encounter Notification  Family member/representative notification Successfully notified  What steps were taken to identify and provide notice to a family member/representative:  Will M., nephew - called and notified of transfer to [id number]    "

## 2024-09-02 NOTE — Progress Notes (Signed)
 [facility]  Megan Daniel FMW:[id number]  DOB: 06-Aug-1935  DOA: 08/31/2024  PCP: Patient, No Pcp Per  09/02/2024,9:07 AM  LOS: 1 day    Code Status: Full code     from: [facility]  89 year old white female Known osteoarthritis with low titer positive RF 2015 followed by Dr. Dolphus and carpal tunnel Lumbago secondary to DDD spinal disease followed by Dr. Margrette [name] peripheral neuropathy followed by Dr. [name] Former smoker quit 2018 HTN  1/28 present from [facility] fall discomfort in the setting of it being in the bathroom spilled water  and fell while trying to clean it up-CT head negative for ICH CT cervical spine negative for fracture CT lumbar spine showed L2 compression fracture-showed acute L2 burst fracture with 8 mm retropulsion no evidence of cord compression Neurosurgery Dr. Darnella recommended TLSO brace PT OT as well as standing images while in TLSO to assess needs MRI was deferred by neurosurgery    Assessment  & Plan :    Acute L2 burst fracture with retropulsion Was unable to place TLSO as had incontinent stool Pain seems moderately controlled but straight leg raise today is painful bilaterally Continue Tylenol  650 Q6 as needed first choice oxycodone  5 every 4 as needed moderate pain orally and Dilaudid  0.5 Q2 as needed severe pain Given her severity of pain I will add low-dose of Robaxin  orally to see if this helps for spasm Further input as per neurosurgery once we are able to get standing TLSO images while in brace to help determine if there would be any benefit from intervention for the patient  Osteoarthritis Chronic lumbago Outpatient follow-up with Dr. [name] treated for carpal tunnel  HTN Blood pressure is uncontrolled so we will start back verapamil  SR to 40 I will hold Lasix 20 Holding aspirin 81 as well as atorvastatin  for now  Mild to moderate dementia Has some subtle confusion-we will resume Risperdal  0.5 at bedtime and hold meds  with possibility of causing confusion such as Allegra   Data Reviewed today: Sodium 140 potassium 3.9 BUN/creatinine 17/0.6 WBC 8.6 platelet 116  DVT prophylaxis: SCD  Dispo/Global plan: Unclear at this time     Subjective:   Awake somewhat incoherent tells me that it is summer and says that it is very cold for August cannot tell me the year 2026 but sometimes confuses that with her year of birth She is not in pain at rest but when we attempt to move her she winces and cries out   Objective + exam Vitals:   09/01/24 1514 09/01/24 2007 09/02/24 0144 09/02/24 0656  BP: (!) 115/55 (!) 123/55 (!) 136/57 (!) 162/75  Pulse: 76 79 73 85  Resp: 14 17 16 16   Temp: 97.6 F (36.4 C) 98.5 F (36.9 C) 98.7 F (37.1 C) 98.9 F (37.2 C)  TempSrc:  Oral Oral Oral  SpO2: 97% 94% 94% 92%  Weight: 56 kg     Height: 5' 5 (1.651 m)      Filed Weights   09/01/24 1514  Weight: 56 kg     Examination: EOMI NCAT no focal deficit no icterus no pallor no wheeze no rales no rhonchi CTAB no added sound S1-S2 no murmur no rub no gallop Power 5/5 Straight leg raises antalgic bilaterally after about 30 degrees I did not attempt any other maneuvers given pain   Scheduled Meds:  lidocaine   1 patch Transdermal Q24H   Continuous Infusions: acetaminophen  **OR** acetaminophen , albuterol , HYDROmorphone  (DILAUDID ) injection, ondansetron  **OR**  ondansetron  (ZOFRAN ) IV, oxyCODONE , traZODone   I spent 46 minutes today before, during and after this patient interview and examination--reviewing pertinent data, coordinating the patient's care and in communication with the care team and other medical professionals  Colen Grimes, MD  [facility]

## 2024-09-02 NOTE — Evaluation (Signed)
 Physical Therapy Evaluation Patient Details Name: Megan Daniel MRN: [id number] DOB: Sep 04, 1935 Today's Date: 09/02/2024  History of Present Illness  89 y.o. female who suffered a mechanical fall at her facility  and is admitted to the hospital for L2 compression fracture. Pt with medical history significant for dementia, hypertension, hypothyroidism, RA  Clinical Impression  Pt admitted with above diagnosis. Pt found sitting at edge of bed attempting to get up without assistance. Assisted pt to done TLSO. Pt oriented to self only. +2 mod assist sit to stand, min assist for posterior lean, pt ambulated 20' with RW.  Patient will benefit from continued inpatient follow up therapy, <3 hours/day. Pt currently with functional limitations due to the deficits listed below (see PT Problem List). Pt will benefit from acute skilled PT to increase their independence and safety with mobility to allow discharge.           If plan is discharge home, recommend the following: Two people to help with walking and/or transfers;A lot of help with bathing/dressing/bathroom;Assistance with cooking/housework;Assist for transportation;Direct supervision/assist for medications management;Direct supervision/assist for financial management;Help with stairs or ramp for entrance   Can travel by private vehicle   No    Equipment Recommendations None recommended by PT  Recommendations for Other Services       Functional Status Assessment Patient has had a recent decline in their functional status and demonstrates the ability to make significant improvements in function in a reasonable and predictable amount of time.     Precautions / Restrictions Precautions Precautions: Fall Recall of Precautions/Restrictions: Impaired Precaution/Restrictions Comments: h/o dementia Required Braces or Orthoses: Spinal Brace Spinal Brace: Thoracolumbosacral orthotic;Applied in sitting position Restrictions Weight Bearing  Restrictions Per Provider Order: No      Mobility  Bed Mobility Overal bed mobility: Needs Assistance Bed Mobility: Supine to Sit, Sit to Supine     Supine to sit: Min assist Sit to supine: Mod assist, +2 for physical assistance, +2 for safety/equipment   General bed mobility comments: Min A to raise trunk; Assist to control trunk descent and for BLEs into bed    Transfers Overall transfer level: Needs assistance Equipment used: Rolling walker (2 wheels) Transfers: Sit to/from Stand, Bed to chair/wheelchair/BSC Sit to Stand: Mod assist, +2 safety/equipment, +2 physical assistance   Step pivot transfers: Min assist, +2 safety/equipment, +2 physical assistance       General transfer comment: assist for posterior lean in standing    Ambulation/Gait Ambulation/Gait assistance: Min assist Gait Distance (Feet): 20 Feet Assistive device: Rolling walker (2 wheels) Gait Pattern/deviations: Step-through pattern, Decreased stride length       General Gait Details: min A for posterior lean intermittently; distance limited by pain/fatigue  Stairs            Wheelchair Mobility     Tilt Bed    Modified Rankin (Stroke Patients Only)       Balance Overall balance assessment: Needs assistance   Sitting balance-Leahy Scale: Poor     Standing balance support: During functional activity, Bilateral upper extremity supported, Reliant on assistive device for balance Standing balance-Leahy Scale: Poor                               Pertinent Vitals/Pain Pain Assessment Pain Assessment: PAINAD Breathing: occasional labored breathing, short period of hyperventilation Negative Vocalization: occasional moan/groan, low speech, negative/disapproving quality Facial Expression: sad, frightened, frown Body Language: tense, distressed pacing, fidgeting  Consolability: distracted or reassured by voice/touch PAINAD Score: 5 Pain Intervention(s): Limited activity  within patient's tolerance, Monitored during session, Premedicated before session, Repositioned    Home Living Family/patient expects to be discharged to:: Unsure                   Additional Comments: pt not able to provide PLOF 2* dementia    Prior Function Prior Level of Function : Patient poor historian/Family not available                     Extremity/Trunk Assessment   Upper Extremity Assessment Upper Extremity Assessment: Defer to OT evaluation    Lower Extremity Assessment Lower Extremity Assessment: Generalized weakness;Difficult to assess due to impaired cognition    Cervical / Trunk Assessment Cervical / Trunk Assessment: Normal  Communication   Communication Communication: No apparent difficulties    Cognition Arousal: Alert Behavior During Therapy: WFL for tasks assessed/performed   PT - Cognitive impairments: History of cognitive impairments, Memory, Orientation, No family/caregiver present to determine baseline, Safety/Judgement, Awareness, Initiation, Problem solving, Sequencing   Orientation impairments: Place, Time, Situation                     Following commands: Impaired Following commands impaired: Follows one step commands with increased time     Cueing Cueing Techniques: Tactile cues, Verbal cues     General Comments      Exercises     Assessment/Plan    PT Assessment Patient needs continued PT services  PT Problem List Decreased activity tolerance;Decreased strength;Decreased mobility;Decreased balance;Pain       PT Treatment Interventions Therapeutic exercise;Therapeutic activities;Gait training;Patient/family education;Functional mobility training;Balance training    PT Goals (Current goals can be found in the Care Plan section)  Acute Rehab PT Goals PT Goal Formulation: Patient unable to participate in goal setting Time For Goal Achievement: 09/16/24 Potential to Achieve Goals: Good    Frequency Min  3X/week     Co-evaluation PT/OT/SLP Co-Evaluation/Treatment: Yes Reason for Co-Treatment: Complexity of the patient's impairments (multi-system involvement);For patient/therapist safety;To address functional/ADL transfers PT goals addressed during session: Mobility/safety with mobility;Balance         AM-PAC PT 6 Clicks Mobility  Outcome Measure Help needed turning from your back to your side while in a flat bed without using bedrails?: A Little Help needed moving from lying on your back to sitting on the side of a flat bed without using bedrails?: A Little Help needed moving to and from a bed to a chair (including a wheelchair)?: A Little Help needed standing up from a chair using your arms (e.g., wheelchair or bedside chair)?: A Lot Help needed to walk in hospital room?: A Little Help needed climbing 3-5 steps with a railing? : A Lot 6 Click Score: 16    End of Session Equipment Utilized During Treatment: Gait belt Activity Tolerance: Patient tolerated treatment well;Patient limited by pain Patient left: in bed;with bed alarm set;with call bell/phone within reach Nurse Communication: Mobility status PT Visit Diagnosis: Difficulty in walking, not elsewhere classified (R26.2);Pain;History of falling (Z91.81)    Time: 8946-8878 PT Time Calculation (min) (ACUTE ONLY): 28 min   Charges:   PT Evaluation $PT Eval Moderate Complexity: 1 Mod   PT General Charges $$ ACUTE PT VISIT: 1 Visit        Sylvan Delon Copp PT 09/02/2024  Acute Rehabilitation Services  Office [phone]

## 2024-09-03 DIAGNOSIS — S32020A Wedge compression fracture of second lumbar vertebra, initial encounter for closed fracture: Secondary | ICD-10-CM | POA: Diagnosis not present

## 2024-09-03 MED ORDER — HYDROMORPHONE HCL 1 MG/ML IJ SOLN: 0.5000 mg | INTRAMUSCULAR | Status: DC | PRN

## 2024-09-03 MED ORDER — ENOXAPARIN SODIUM 40 MG/0.4ML IJ SOSY
40.0000 mg | PREFILLED_SYRINGE | INTRAMUSCULAR | Status: DC
  Administered 2024-09-03 – 2024-09-06 (×3): 40 mg via SUBCUTANEOUS
  Filled 2024-09-03 (×3): qty 0.4

## 2024-09-03 NOTE — TOC Progression Note (Addendum)
 Transition of Care [facility]) - Progression Note    Patient Details  Name: Megan Daniel MRN: [id number] Date of Birth: 1936-04-09  [facility]) CM/SW Contact  Luise JAYSON Pan, [place] Phone Number: 09/03/2024, 12:13 PM  Clinical Narrative:   CSW left VM for [employer], [phone].  2:17 PM Will called CSW back to state that patient should be having a procedure Monday 2/2. Per chart review, plan is for a L2 kyphoplasty scheduled for 2/2.   CSW discussed PT rec for STR with Will. Will stated he will do whatever is best for the patient but would feel better about patient discharging back to [facility] memory care to minimize the amount of new environments patient is in due to her dementia. CSW informed Will that ICM team will follow up with [facility] about patient discharging back there.   CSW will continue to follow.    Expected Discharge Plan: Memory Care Barriers to Discharge: No Barriers Identified, Continued Medical Work up               Expected Discharge Plan and Services In-house Referral: NA Discharge Planning Services: CM Consult Post Acute Care Choice: NA Living arrangements for the past 2 months: Assisted Living Facility                 DME Arranged: N/A DME Agency: NA       HH Arranged: NA HH Agency: NA         Social Drivers of Health (SDOH) Interventions SDOH Screenings   Food Insecurity: Patient Unable To Answer (09/01/2024)  Housing: Unknown (09/01/2024)  Transportation Needs: Patient Unable To Answer (09/01/2024)  Utilities: Patient Unable To Answer (09/01/2024)  Social Connections: Unknown (09/01/2024)  Tobacco Use: Medium Risk (09/02/2024)    Readmission Risk Interventions    09/02/2024    9:20 AM  Readmission Risk Prevention Plan  Post Dischage Appt Complete  Medication Screening Complete  Transportation Screening Complete

## 2024-09-03 NOTE — Progress Notes (Signed)
 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 CT head negative for ICH, CT cervical spine negative for fracture, however CT lumbar spine showed acute L2 burst fracture with 8 mm retropulsion, no evidence of cord compression. Neurosurgery Dr. Darnella recommended TLSO brace, PT/OT and plan for L2 kyphoplasty on 09/05/24. MRI was deferred by neurosurgery.   Today, patient noted to be pleasantly confused, appears comfortable.   Assessment/Plan: Principal Problem:   Closed compression fracture of L2 vertebra, initial encounter (HCC)   Acute L2 burst fracture with retropulsion CT lumbar spine showed above Unable to place TLSO due to stool incontinence Neurosurgery on board, plan for L2 kyphoplasty on 09/05/2024 Pain management  Hypertension BP stable Continue verapamil   Hypothyroidism Continue Synthroid    Osteoarthritis Chronic lumbago Outpatient follow-up   Dementia Resumed Risperdal  0.5 mg at bedtime      Estimated body mass index is 20.54 kg/m as calculated from the following:   Height as of this encounter: 5' 5 (1.651 m).   Weight as of this encounter: 56 kg.     Code Status: Full  Family Communication: None at bedside  Disposition Plan: Status is: Inpatient Remains inpatient appropriate because: Level of care      Consultants: Neurosurgery  Procedures: None  Antimicrobials: None  DVT prophylaxis: Lovenox    Objective: Vitals:   09/02/24 1648 09/02/24 2046 09/03/24 0501 09/03/24 0809  BP: 137/64 (!) 151/71 (!) 111/58 117/67  Pulse: 85 80 74 67  Resp: 16 16 16 17   Temp: 98.2 F (36.8 C) 99.1 F (37.3 C) 98.9 F (37.2 C) 98 F  (36.7 C)  TempSrc: Oral Oral Oral Oral  SpO2: 92% 95% 96% 94%  Weight:      Height:        Intake/Output Summary (Last 24 hours) at 09/03/2024 1332 Last data filed at 09/03/2024 0853 Gross per 24 hour  Intake 865 ml  Output 300 ml  Net 565 ml   Filed Weights   09/01/24 1514  Weight: 56 kg    Exam: General: NAD  Cardiovascular: S1, S2 present Respiratory: CTAB Abdomen: Soft, nontender, nondistended, bowel sounds present Musculoskeletal: No bilateral pedal edema noted Skin: Normal Psychiatry: Unable to assess    Data Reviewed: CBC: Recent Labs  Lab 09/01/24 0113 09/02/24 0551  WBC 9.9 8.6  NEUTROABS 5.4  --   HGB 11.6* 10.0*  HCT 35.5* 30.9*  MCV 96.7 96.3  PLT 147* 116*   Basic Metabolic Panel: Recent Labs  Lab 09/01/24 0113 09/02/24 0551  NA 145 140  K 3.0* 3.9  CL 106 106  CO2 27 28  GLUCOSE 103* 115*  BUN 19 17  CREATININE 0.74 0.62  CALCIUM  10.5* 9.6   GFR: Estimated Creatinine Clearance: 44.6 mL/min (by C-G formula based on SCr of 0.62 mg/dL). Liver Function Tests: No results for input(s): AST, ALT, ALKPHOS, BILITOT, PROT, ALBUMIN  in the last 168 hours. No results for input(s): LIPASE, AMYLASE in the last 168 hours. No results for input(s): AMMONIA in the last 168 hours. Coagulation Profile: Recent Labs  Lab 09/02/24 1425  INR 1.1   Cardiac Enzymes: No results for input(s): CKTOTAL, CKMB, CKMBINDEX, TROPONINI in the last 168 hours. BNP (  last 3 results) No results for input(s): PROBNP in the last 8760 hours. HbA1C: No results for input(s): HGBA1C in the last 72 hours. CBG: No results for input(s): GLUCAP in the last 168 hours. Lipid Profile: No results for input(s): CHOL, HDL, LDLCALC, TRIG, CHOLHDL, LDLDIRECT in the last 72 hours. Thyroid  Function Tests: No results for input(s): TSH, T4TOTAL, FREET4, T3FREE, THYROIDAB in the last 72 hours. Anemia Panel: No results for input(s):  VITAMINB12, FOLATE, FERRITIN, TIBC, IRON, RETICCTPCT in the last 72 hours. Urine analysis:    Component Value Date/Time   COLORURINE YELLOW 10/22/2023 1718   APPEARANCEUR HAZY (A) 10/22/2023 1718   LABSPEC 1.019 10/22/2023 1718   PHURINE 6.0 10/22/2023 1718   GLUCOSEU NEGATIVE 10/22/2023 1718   HGBUR NEGATIVE 10/22/2023 1718   BILIRUBINUR NEGATIVE 10/22/2023 1718   KETONESUR NEGATIVE 10/22/2023 1718   PROTEINUR 30 (A) 10/22/2023 1718   NITRITE NEGATIVE 10/22/2023 1718   LEUKOCYTESUR MODERATE (A) 10/22/2023 1718   Sepsis Labs: @LABRCNTIP (procalcitonin:4,lacticidven:4)  )No results found for this or any previous visit (from the past 240 hours).    Studies: No results found.  Scheduled Meds:  levothyroxine   112 mcg Oral QAC breakfast   lidocaine   1 patch Transdermal Q24H   methocarbamol   500 mg Oral TID   risperiDONE   0.5 mg Oral QHS   verapamil   240 mg Oral QHS    Continuous Infusions:   LOS: 2 days     Megan Court J Kaspar Albornoz, MD [facility]  If 7PM-7AM, please contact night-coverage [email or web address] 09/03/2024, 1:32 PM    "

## 2024-09-03 NOTE — Progress Notes (Signed)
" °  °  Providing Compassionate, Quality Care - Together   NEUROSURGERY PROGRESS NOTE     S: No issues overnight. C/o LBP w/ movement   O: EXAM:  BP 117/67 (BP Location: Right Arm)   Pulse 67   Temp 98 F (36.7 C) (Oral)   Resp 17   Ht 5' 5 (1.651 m)   Wt 56 kg   SpO2 94%   BMI 20.54 kg/m     Awake, alert, oriented to person, situation.  Speech fluent CNs grossly intact  Strength/sensation grossly intact BUE/BLE   ASSESSMENT:  89 year old female with history of dementia, RA on aspirin 81 presents after fall with imaging showing acute L2 burst fracture with worsening collapse on upright x-ray and pain refractory to TLSO brace     PLAN: - L2 kyphoplasty scheduled for 2/2. NPO m/n.  -Activity as tolerated in TLSO brace in the meantime -Diet as tolerated -Okay for DVT prophylaxis -Hold all other anticoagulation and antiplatelets -Rest of care per primary   Camie Pickle, PAC  "

## 2024-09-04 DIAGNOSIS — S32020A Wedge compression fracture of second lumbar vertebra, initial encounter for closed fracture: Secondary | ICD-10-CM | POA: Diagnosis not present

## 2024-09-04 NOTE — Progress Notes (Signed)
 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 CT head negative for ICH, CT cervical spine negative for fracture, however CT lumbar spine showed acute L2 burst fracture with 8 mm retropulsion, no evidence of cord compression. Neurosurgery Dr. Darnella recommended TLSO brace, PT/OT and plan for L2 kyphoplasty on 09/05/24. MRI was deferred by neurosurgery.   Today, met patient sleeping, easy to awake.   Assessment/Plan: Principal Problem:   Closed compression fracture of L2 vertebra, initial encounter (HCC)   Acute L2 burst fracture with retropulsion CT lumbar spine showed above Unable to place TLSO due to stool incontinence Neurosurgery on board, plan for L2 kyphoplasty on 09/05/2024 Pain management  Hypertension BP stable Continue verapamil   Hypothyroidism Continue Synthroid    Osteoarthritis Chronic lumbago Outpatient follow-up   Dementia Resumed Risperdal  0.5 mg at bedtime      Estimated body mass index is 20.54 kg/m as calculated from the following:   Height as of this encounter: 5' 5 (1.651 m).   Weight as of this encounter: 56 kg.     Code Status: Full  Family Communication: None at bedside  Disposition Plan: Status is: Inpatient Remains inpatient appropriate because: Level of care      Consultants: Neurosurgery  Procedures: None  Antimicrobials: None  DVT prophylaxis: Lovenox    Objective: Vitals:   09/03/24 0809 09/03/24 1457 09/03/24 2122 09/03/24 2346  BP: 117/67 (!) 133/58 (!) 154/66 139/86  Pulse: 67 73 72 72  Resp: 17 16 18 18   Temp: 98 F (36.7 C) 98.4 F (36.9 C) 98.4 F (36.9 C) 98.4 F (36.9 C)  TempSrc: Oral  Oral Oral Oral  SpO2: 94% 98% 96% 95%  Weight:      Height:        Intake/Output Summary (Last 24 hours) at 09/04/2024 1520 Last data filed at 09/04/2024 1319 Gross per 24 hour  Intake 480 ml  Output --  Net 480 ml   Filed Weights   09/01/24 1514  Weight: 56 kg    Exam: General: NAD  Cardiovascular: S1, S2 present Respiratory: CTAB Abdomen: Soft, nontender, nondistended, bowel sounds present Musculoskeletal: No bilateral pedal edema noted Skin: Normal Psychiatry: Unable to assess    Data Reviewed: CBC: Recent Labs  Lab 09/01/24 0113 09/02/24 0551  WBC 9.9 8.6  NEUTROABS 5.4  --   HGB 11.6* 10.0*  HCT 35.5* 30.9*  MCV 96.7 96.3  PLT 147* 116*   Basic Metabolic Panel: Recent Labs  Lab 09/01/24 0113 09/02/24 0551  NA 145 140  K 3.0* 3.9  CL 106 106  CO2 27 28  GLUCOSE 103* 115*  BUN 19 17  CREATININE 0.74 0.62  CALCIUM  10.5* 9.6   GFR: Estimated Creatinine Clearance: 44.6 mL/min (by C-G formula based on SCr of 0.62 mg/dL). Liver Function Tests: No results for input(s): AST, ALT, ALKPHOS, BILITOT, PROT, ALBUMIN  in the last 168 hours. No results for input(s): LIPASE, AMYLASE in the last 168 hours. No results for input(s): AMMONIA in the last 168 hours. Coagulation Profile: Recent Labs  Lab 09/02/24 1425  INR 1.1   Cardiac Enzymes: No results for input(s): CKTOTAL, CKMB, CKMBINDEX, TROPONINI in the last 168 hours. BNP (last 3 results)  No results for input(s): PROBNP in the last 8760 hours. HbA1C: No results for input(s): HGBA1C in the last 72 hours. CBG: No results for input(s): GLUCAP in the last 168 hours. Lipid Profile: No results for input(s): CHOL, HDL, LDLCALC, TRIG, CHOLHDL, LDLDIRECT in the last 72 hours. Thyroid  Function Tests: No results for input(s): TSH, T4TOTAL, FREET4, T3FREE, THYROIDAB in the last 72 hours. Anemia Panel: No results for input(s): VITAMINB12, FOLATE,  FERRITIN, TIBC, IRON, RETICCTPCT in the last 72 hours. Urine analysis:    Component Value Date/Time   COLORURINE YELLOW 10/22/2023 1718   APPEARANCEUR HAZY (A) 10/22/2023 1718   LABSPEC 1.019 10/22/2023 1718   PHURINE 6.0 10/22/2023 1718   GLUCOSEU NEGATIVE 10/22/2023 1718   HGBUR NEGATIVE 10/22/2023 1718   BILIRUBINUR NEGATIVE 10/22/2023 1718   KETONESUR NEGATIVE 10/22/2023 1718   PROTEINUR 30 (A) 10/22/2023 1718   NITRITE NEGATIVE 10/22/2023 1718   LEUKOCYTESUR MODERATE (A) 10/22/2023 1718   Sepsis Labs: @LABRCNTIP (procalcitonin:4,lacticidven:4)  )No results found for this or any previous visit (from the past 240 hours).    Studies: No results found.  Scheduled Meds:  enoxaparin  (LOVENOX ) injection  40 mg Subcutaneous Q24H   levothyroxine   112 mcg Oral QAC breakfast   lidocaine   1 patch Transdermal Q24H   methocarbamol   500 mg Oral TID   risperiDONE   0.5 mg Oral QHS   verapamil   240 mg Oral QHS    Continuous Infusions:   LOS: 3 days     Lebron JINNY Cage, MD [facility]  If 7PM-7AM, please contact night-coverage [email or web address] 09/04/2024, 3:20 PM    "

## 2024-09-05 ENCOUNTER — Other Ambulatory Visit: Payer: Self-pay

## 2024-09-05 ENCOUNTER — Encounter (HOSPITAL_COMMUNITY)
Admission: EM | Disposition: A | Discharge status: Skilled Nursing Facility | Payer: Self-pay | Source: Skilled Nursing Facility | Attending: Family Medicine

## 2024-09-05 ENCOUNTER — Inpatient Hospital Stay (HOSPITAL_COMMUNITY)

## 2024-09-05 ENCOUNTER — Encounter (HOSPITAL_COMMUNITY): Payer: Self-pay | Admitting: Internal Medicine

## 2024-09-05 DIAGNOSIS — S32020A Wedge compression fracture of second lumbar vertebra, initial encounter for closed fracture: Secondary | ICD-10-CM | POA: Diagnosis not present

## 2024-09-05 LAB — BASIC METABOLIC PANEL WITH GFR
Anion gap: 10 (ref 5–15)
BUN: 15 mg/dL (ref 8–23)
CO2: 25 mmol/L (ref 22–32)
Calcium: 8.9 mg/dL (ref 8.9–10.3)
Chloride: 106 mmol/L (ref 98–111)
Creatinine, Ser: 0.59 mg/dL (ref 0.44–1.00)
GFR, Estimated: >60 mL/min
Glucose, Bld: 74 mg/dL (ref 70–99)
Potassium: 4.1 mmol/L (ref 3.5–5.1)
Sodium: 141 mmol/L (ref 135–145)

## 2024-09-05 LAB — CBC WITH DIFFERENTIAL/PLATELET
Abs Immature Granulocytes: 0.02 10*3/uL (ref 0.00–0.07)
Basophils Absolute: 0 10*3/uL (ref 0.0–0.1)
Basophils Relative: 0 %
Eosinophils Absolute: 0.1 10*3/uL (ref 0.0–0.5)
Eosinophils Relative: 2 %
HCT: 30.7 % — ABNORMAL LOW (ref 36.0–46.0)
Hemoglobin: 10.5 g/dL — ABNORMAL LOW (ref 12.0–15.0)
Immature Granulocytes: 0 %
Lymphocytes Relative: 59 %
Lymphs Abs: 4.2 10*3/uL — ABNORMAL HIGH (ref 0.7–4.0)
MCH: 32.2 pg (ref 26.0–34.0)
MCHC: 34.2 g/dL (ref 30.0–36.0)
MCV: 94.2 fL (ref 80.0–100.0)
Monocytes Absolute: 0.3 10*3/uL (ref 0.1–1.0)
Monocytes Relative: 4 %
Neutro Abs: 2.5 10*3/uL (ref 1.7–7.7)
Neutrophils Relative %: 35 %
Platelets: 131 10*3/uL — ABNORMAL LOW (ref 150–400)
RBC: 3.26 MIL/uL — ABNORMAL LOW (ref 3.87–5.11)
RDW: 13.2 % (ref 11.5–15.5)
Smear Review: NORMAL
WBC: 7.2 10*3/uL (ref 4.0–10.5)
nRBC: 0 % (ref 0.0–0.2)

## 2024-09-05 LAB — SURGICAL PCR SCREEN
MRSA, PCR: NEGATIVE
Staphylococcus aureus: POSITIVE — AB

## 2024-09-05 MED ORDER — AMISULPRIDE (ANTIEMETIC) 5 MG/2ML IV SOLN: 10.0000 mg | Freq: Once | INTRAVENOUS | Status: DC | PRN

## 2024-09-05 MED ORDER — LIDOCAINE-EPINEPHRINE 2 %-1:100000 IJ SOLN
INTRAMUSCULAR | Status: AC
  Filled 2024-09-05: qty 1

## 2024-09-05 MED ORDER — 0.9 % SODIUM CHLORIDE (POUR BTL) OPTIME
TOPICAL | Status: DC | PRN
  Administered 2024-09-05: 1000 mL

## 2024-09-05 MED ORDER — ROCURONIUM BROMIDE 10 MG/ML (PF) SYRINGE
PREFILLED_SYRINGE | INTRAVENOUS | Status: DC | PRN
  Administered 2024-09-05: 60 mg via INTRAVENOUS

## 2024-09-05 MED ORDER — PHENYLEPHRINE HCL-NACL 20-0.9 MG/250ML-% IV SOLN
INTRAVENOUS | Status: DC | PRN
  Administered 2024-09-05: 55 ug/min via INTRAVENOUS

## 2024-09-05 MED ORDER — PHENYLEPHRINE 80 MCG/ML (10ML) SYRINGE FOR IV PUSH (FOR BLOOD PRESSURE SUPPORT)
PREFILLED_SYRINGE | INTRAVENOUS | Status: DC | PRN
  Administered 2024-09-05: 160 ug via INTRAVENOUS
  Administered 2024-09-05 (×5): 80 ug via INTRAVENOUS

## 2024-09-05 MED ORDER — LIDOCAINE-EPINEPHRINE (PF) 1 %-1:200000 IJ SOLN
INTRAMUSCULAR | Status: DC | PRN
  Administered 2024-09-05: 10 mL via INTRAMUSCULAR

## 2024-09-05 MED ORDER — FENTANYL CITRATE (PF) 250 MCG/5ML IJ SOLN
INTRAMUSCULAR | Status: DC | PRN
  Administered 2024-09-05: 50 ug via INTRAVENOUS
  Administered 2024-09-05: 100 ug via INTRAVENOUS
  Administered 2024-09-05: 25 ug via INTRAVENOUS

## 2024-09-05 MED ORDER — SUGAMMADEX SODIUM 200 MG/2ML IV SOLN
INTRAVENOUS | Status: DC | PRN
  Administered 2024-09-05: 150 mg via INTRAVENOUS

## 2024-09-05 MED ORDER — PHENYLEPHRINE HCL-NACL 20-0.9 MG/250ML-% IV SOLN
INTRAVENOUS | Status: AC
  Filled 2024-09-05: qty 250

## 2024-09-05 MED ORDER — ALBUMIN HUMAN 5 % IV SOLN
INTRAVENOUS | Status: AC
  Filled 2024-09-05: qty 250

## 2024-09-05 MED ORDER — ALBUMIN HUMAN 5 % IV SOLN
12.5000 g | Freq: Once | INTRAVENOUS | Status: AC
  Administered 2024-09-05: 12.5 g via INTRAVENOUS

## 2024-09-05 MED ORDER — CHLORHEXIDINE GLUCONATE 0.12 % MT SOLN
15.0000 mL | Freq: Once | OROMUCOSAL | Status: AC
  Administered 2024-09-05: 15 mL via OROMUCOSAL
  Filled 2024-09-05: qty 15

## 2024-09-05 MED ORDER — ONDANSETRON HCL 4 MG/2ML IJ SOLN: 4.0000 mg | Freq: Once | INTRAMUSCULAR | Status: DC | PRN

## 2024-09-05 MED ORDER — LIDOCAINE 2% (20 MG/ML) 5 ML SYRINGE
INTRAMUSCULAR | Status: AC
  Filled 2024-09-05: qty 5

## 2024-09-05 MED ORDER — ACETAMINOPHEN 10 MG/ML IV SOLN: 1000.0000 mg | Freq: Once | INTRAVENOUS | Status: DC | PRN

## 2024-09-05 MED ORDER — PHENYLEPHRINE 80 MCG/ML (10ML) SYRINGE FOR IV PUSH (FOR BLOOD PRESSURE SUPPORT)
PREFILLED_SYRINGE | INTRAVENOUS | Status: AC
  Filled 2024-09-05: qty 10

## 2024-09-05 MED ORDER — OXYCODONE HCL 5 MG PO TABS: 5.0000 mg | ORAL_TABLET | Freq: Once | ORAL | Status: DC | PRN

## 2024-09-05 MED ORDER — LIDOCAINE 2% (20 MG/ML) 5 ML SYRINGE
INTRAMUSCULAR | Status: DC | PRN
  Administered 2024-09-05: 100 mg via INTRAVENOUS

## 2024-09-05 MED ORDER — FENTANYL CITRATE (PF) 250 MCG/5ML IJ SOLN
INTRAMUSCULAR | Status: AC
  Filled 2024-09-05: qty 5

## 2024-09-05 MED ORDER — PROPOFOL 10 MG/ML IV BOLUS
INTRAVENOUS | Status: AC
  Filled 2024-09-05: qty 20

## 2024-09-05 MED ORDER — ONDANSETRON HCL 4 MG/2ML IJ SOLN
INTRAMUSCULAR | Status: AC
  Filled 2024-09-05: qty 2

## 2024-09-05 MED ORDER — ONDANSETRON HCL 4 MG/2ML IJ SOLN
INTRAMUSCULAR | Status: DC | PRN
  Administered 2024-09-05: 4 mg via INTRAVENOUS

## 2024-09-05 MED ORDER — LACTATED RINGERS IV SOLN: INTRAVENOUS | Status: DC

## 2024-09-05 MED ORDER — FENTANYL CITRATE (PF) 100 MCG/2ML IJ SOLN: 25.0000 ug | INTRAMUSCULAR | Status: DC | PRN

## 2024-09-05 MED ORDER — BUPIVACAINE-EPINEPHRINE (PF) 0.5% -1:200000 IJ SOLN
INTRAMUSCULAR | Status: AC
  Filled 2024-09-05: qty 30

## 2024-09-05 MED ORDER — ORAL CARE MOUTH RINSE: 15.0000 mL | Freq: Once | OROMUCOSAL | Status: AC

## 2024-09-05 MED ORDER — CEFAZOLIN SODIUM-DEXTROSE 2-3 GM-%(50ML) IV SOLR
INTRAVENOUS | Status: DC | PRN
  Administered 2024-09-05: 2 g via INTRAVENOUS

## 2024-09-05 MED ORDER — ROCURONIUM BROMIDE 10 MG/ML (PF) SYRINGE
PREFILLED_SYRINGE | INTRAVENOUS | Status: AC
  Filled 2024-09-05: qty 10

## 2024-09-05 MED ORDER — LIDOCAINE-EPINEPHRINE 1 %-1:100000 IJ SOLN
INTRAMUSCULAR | Status: AC
  Filled 2024-09-05: qty 1

## 2024-09-05 MED ORDER — PROPOFOL 500 MG/50ML IV EMUL
INTRAVENOUS | Status: DC | PRN
  Administered 2024-09-05: 90 ug/kg/min via INTRAVENOUS

## 2024-09-05 MED ORDER — IOPAMIDOL (ISOVUE-300) INJECTION 61%
INTRAVENOUS | Status: DC | PRN
  Administered 2024-09-05: 10 mL

## 2024-09-05 MED ORDER — PROPOFOL 10 MG/ML IV BOLUS
INTRAVENOUS | Status: DC | PRN
  Administered 2024-09-05: 20 mg via INTRAVENOUS
  Administered 2024-09-05: 60 mg via INTRAVENOUS
  Administered 2024-09-05: 20 mg via INTRAVENOUS

## 2024-09-05 MED ORDER — OXYCODONE HCL 5 MG/5ML PO SOLN: 5.0000 mg | Freq: Once | ORAL | Status: DC | PRN

## 2024-09-05 NOTE — TOC CAGE-AID Note (Signed)
 Transition of Care [facility]) - CAGE-AID Screening   Patient Details  Name: Megan Daniel MRN: [id number] Date of Birth: 02-Dec-1935  [facility]) CM/SW Contact:    Myrah Strawderman E Rhia Blatchford, LCSW Phone Number: 09/05/2024, 8:48 AM   Clinical Narrative:    CAGE-AID Screening: Substance Abuse Screening unable to be completed due to: : Patient unable to participate

## 2024-09-05 NOTE — Op Note (Signed)
 PREOP DIAGNOSIS: L2 compression fx   POSTOP DIAGNOSIS: Same  PROCEDURE: 1. L2 Kyphoplasty  SURGEON: Dr. Dorn Glade, MD  ASSISTANT: None  ANESTHESIA: general  EBL: Minimal  SPECIMENS: None  DRAINS: None  COMPLICATIONS: None immediate  CONDITION: Hemodynamically stable to PCAU  HISTORY: Megan Daniel is a 89 y.o. yo female with hx of dementia who presented after a fall. Imaging showed a mild L2 burst fracture. TLSO was recommended. However, patient's pain was intolerable in the brace. Additionally, the fracture was more collapsed on uprigth x-ray. I offered an L2 kyphoplasty. I explained the risks including but not limited to bleeding, infection, cement extravasation, csf leak, nerve root injury, mortality, general anesthesia risk, adjacent level fracture. Her POA verbalized understanding  PROCEDURE IN DETAIL: After informed consent was obtained and witnessed, the patient was brought to the operating room. After induction of anesthesia, the patient was positioned on the operative table in the prone position. All pressure points were meticulously padded. Fluoroscopy was used to mark out the projection of the L2 pedicles on the skin. Skin incision was then marked out and prepped and draped in the usual sterile fashion.  After time out was conducted, bilateral stab incisions were made and Jamshidi needles were introduced. Under AP and lateral fluoroscopic guidance, bilateral L2 pedicles were canulated. Drill was then used to create a channel and the kyphoplasty balloon was placed and expanded to create a cavity. Approximately 2cc of PMMA cement was injected in each side under fluoroscopy, taking care to preserve the posterior cortex. The balloons were then removed, replaced with the trocars, and the Jamshidi needles removed.  Stab incisions were then closed with 3-0 vicryl suture and standard skin glue. The patient was then transferred to the stretcher and taken to the PACU in  stable hemodynamic condition.  At the end of the case all sponge, needle, and instrument counts were correct.

## 2024-09-05 NOTE — Transfer of Care (Signed)
 Immediate Anesthesia Transfer of Care Note  Patient: Megan Daniel  Procedure(s) Performed: KYPHOPLASTY LUMBAR TWO  Patient Location: PACU  Anesthesia Type:General  Level of Consciousness: awake and sedated  Airway & Oxygen Therapy: Patient Spontanous Breathing  Post-op Assessment: Report given to RN and Post -op Vital signs reviewed and stable  Post vital signs: Reviewed and stable  Last Vitals:  Vitals Value Taken Time  BP 101/55 09/05/24 16:48  Temp 36.2 C 09/05/24 16:48  Pulse 71 09/05/24 16:50  Resp 11 09/05/24 16:50  SpO2 94 % 09/05/24 16:50  Vitals shown include unfiled device data.  Last Pain:  Vitals:   09/05/24 1257  TempSrc:   PainSc: 0-No pain      Patients Stated Pain Goal: 2 (09/03/24 2123)  Complications: No notable events documented.

## 2024-09-05 NOTE — Progress Notes (Signed)
 Assessment 89 year old female with history of dementia, RA on aspirin 81 presents after fall with imaging showing acute L2 burst fracture with worsening collapse on upright x-ray and pain refractory to TLSO brace   LOS: 4 days    Plan: L2 kyphoplasty today   Subjective: Neurologically unchanged  Objective: Vital signs in last 24 hours: Temp:  [97.7 F (36.5 C)-98.2 F (36.8 C)] 98.1 F (36.7 C) (02/02 0748) Pulse Rate:  [60-73] 73 (02/02 0748) Resp:  [16-18] 16 (02/02 0748) BP: (104-139)/(51-61) 139/61 (02/02 0748) SpO2:  [95 %-98 %] 95 % (02/02 0748)  Intake/Output from previous day: 02/01 0701 - 02/02 0700 In: 240 [P.O.:240] Out: -  Intake/Output this shift: No intake/output data recorded.  Exam: GCS 4E 4V 36M  Baseline dementia. Can answer simple questions but is not oriented at all No aphasia or dysarthria PERRL EOMI, conjugate gaze FS TM Generalized weakness BUE and BLE 4/5 which I believe is effort related Full sensation  Lab Results: Recent Labs    09/05/24 0347  WBC 7.2  HGB 10.5*  HCT 30.7*  PLT 131*   BMET Recent Labs    09/05/24 0347  NA 141  K 4.1  CL 106  CO2 25  GLUCOSE 74  BUN 15  CREATININE 0.59  CALCIUM  8.9       Dorn JONELLE Glade 09/05/2024, 8:38 AM

## 2024-09-05 NOTE — Anesthesia Preprocedure Evaluation (Addendum)
"                                    Anesthesia Evaluation  Patient identified by MRN, date of birth, ID band Patient confused  General Assessment Comment:Oriented to name, DOB   Reviewed: Allergy & Precautions, NPO status , Patient's Chart, lab work & pertinent test results  History of Anesthesia Complications Negative for: history of anesthetic complications  Airway Mallampati: II  TM Distance: >3 FB Neck ROM: Full    Dental  (+) Edentulous Upper, Missing   Pulmonary sleep apnea , former smoker   breath sounds clear to auscultation       Cardiovascular hypertension, Pt. on medications  Rhythm:Regular Rate:Normal + Systolic murmurs  TTE (2024): 1. Left ventricular ejection fraction, by estimation, is 70 to 75%. The  left ventricle has hyperdynamic function. The left ventricle has no  regional wall motion abnormalities. Left ventricular diastolic parameters  are consistent with Grade I diastolic dysfunction (impaired relaxation). Elevated left atrial pressure.   2. Right ventricular systolic function is normal. The right ventricular  size is normal. There is mildly elevated pulmonary artery systolic  pressure.   3. The mitral valve is normal in structure. No evidence of mitral valve  regurgitation. No evidence of mitral stenosis.   4. The tricuspid valve is abnormal. Tricuspid valve regurgitation is  moderate.   5. The aortic valve has an indeterminant number of cusps. There is  moderate calcification of the aortic valve. There is moderate thickening  of the aortic valve. Aortic valve regurgitation is not visualized. No  aortic stenosis is present.   6. The inferior vena cava is dilated in size with >50% respiratory  variability, suggesting right atrial pressure of 8 mmHg.      Neuro/Psych  Headaches  Anxiety Depression   Dementia    GI/Hepatic ,GERD  ,, Hx of Esophageal Stricture     Endo/Other  Hypothyroidism    Renal/GU Renal disease      Musculoskeletal  (+) Arthritis , Rheumatoid disorders,   L2 Acute Burst Fracture 2/2 Fall + Osteoporosis     Abdominal   Peds  Hematology  Hgb 10.5, Plts 131K (09/05/24)     Anesthesia Other Findings   Reproductive/Obstetrics                              Anesthesia Physical Anesthesia Plan  ASA: 3  Anesthesia Plan: General   Post-op Pain Management:    Induction: Intravenous  PONV Risk Score and Plan: 2 and Ondansetron , Dexamethasone , TIVA and Treatment may vary due to age or medical condition  Airway Management Planned: Oral ETT  Additional Equipment: None  Intra-op Plan:   Post-operative Plan: Extubation in OR  Informed Consent: I have reviewed the patients History and Physical, chart, labs and discussed the procedure including the risks, benefits and alternatives for the proposed anesthesia with the patient or authorized representative who has indicated his/her understanding and acceptance.     Dental advisory given and Consent reviewed with POA  Plan Discussed with: CRNA and Surgeon  Anesthesia Plan Comments:          Anesthesia Quick Evaluation  "

## 2024-09-05 NOTE — Progress Notes (Signed)
 PT Cancellation Note  Patient Details Name: Megan Daniel MRN: [id number] DOB: 1936-01-25   Cancelled Treatment:    Reason Eval/Treat Not Completed: Medical issues which prohibited therapy; RN reports pt on schedule for 2:30 for kyphoplasty.  She notes pt in pain with bed level movement today.  Will hold off on PT till after procedure today or early tomorrow am.    Montie Portal 09/05/2024, 11:22 AM Micheline Portal, PT Acute Rehabilitation Services Office:[phone] 09/05/2024

## 2024-09-05 NOTE — Anesthesia Procedure Notes (Signed)
 Procedure Name: Intubation Date/Time: 09/05/2024 3:28 PM  Performed by: Myrna Homer, CRNAPre-anesthesia Checklist: Patient identified, Emergency Drugs available, Suction available and Patient being monitored Patient Re-evaluated:Patient Re-evaluated prior to induction Oxygen Delivery Method: Circle System Utilized Preoxygenation: Pre-oxygenation with 100% oxygen Induction Type: IV induction Ventilation: Mask ventilation without difficulty Laryngoscope Size: Mac and 3 Grade View: Grade II Tube type: Oral Tube size: 7.0 mm Number of attempts: 1 Airway Equipment and Method: Stylet and Oral airway Placement Confirmation: ETT inserted through vocal cords under direct vision, positive ETCO2 and breath sounds checked- equal and bilateral Secured at: 21 cm Tube secured with: Tape Dental Injury: Teeth and Oropharynx as per pre-operative assessment

## 2024-09-06 ENCOUNTER — Encounter (HOSPITAL_COMMUNITY): Payer: Self-pay

## 2024-09-06 LAB — CBC
HCT: 32.1 % — ABNORMAL LOW (ref 36.0–46.0)
Hemoglobin: 10.4 g/dL — ABNORMAL LOW (ref 12.0–15.0)
MCH: 31 pg (ref 26.0–34.0)
MCHC: 32.4 g/dL (ref 30.0–36.0)
MCV: 95.8 fL (ref 80.0–100.0)
Platelets: 144 10*3/uL — ABNORMAL LOW (ref 150–400)
RBC: 3.35 MIL/uL — ABNORMAL LOW (ref 3.87–5.11)
RDW: 13.2 % (ref 11.5–15.5)
WBC: 7.2 10*3/uL (ref 4.0–10.5)
nRBC: 0 % (ref 0.0–0.2)

## 2024-09-06 MED ORDER — SODIUM CHLORIDE 0.9 % IV SOLN: INTRAVENOUS | Status: AC

## 2024-09-06 MED ORDER — SODIUM CHLORIDE 0.9 % IV BOLUS
500.0000 mL | Freq: Once | INTRAVENOUS | Status: AC
  Administered 2024-09-06: 500 mL via INTRAVENOUS

## 2024-09-06 NOTE — TOC Progression Note (Addendum)
 Transition of Care [facility]) - Progression Note    Patient Details  Name: Megan Daniel MRN: [id number] Date of Birth: Nov 28, 1935  Transition of Care [facility]) CM/SW Contact  Bridget Cordella Simmonds, LCSW Phone Number: 09/06/2024, 12:32 PM  Clinical Narrative:   CSW spoke with Barbara/Director [facility].  She confirmed pt in memory care there, discussed nephew requesting return to memory care rather than SNF.  Heron is agreeable to this, will need FL2 and DC summary faxed to [phone].  1455: CSW spoke with pt nephew/POA Will, updated him that [facility] stating they can take her directly back to memory care with PT there.  Per Will, pt normally is able to walk with her walker.  Expected Discharge Plan: Memory Care Barriers to Discharge: No Barriers Identified, Continued Medical Work up               Expected Discharge Plan and Services In-house Referral: NA Discharge Planning Services: CM Consult Post Acute Care Choice: NA Living arrangements for the past 2 months: Assisted Living Facility                 DME Arranged: N/A DME Agency: NA       HH Arranged: NA HH Agency: NA         Social Drivers of Health (SDOH) Interventions SDOH Screenings   Food Insecurity: Patient Unable To Answer (09/01/2024)  Housing: Unknown (09/01/2024)  Transportation Needs: Patient Unable To Answer (09/01/2024)  Utilities: Patient Unable To Answer (09/01/2024)  Social Connections: Unknown (09/01/2024)  Tobacco Use: Medium Risk (09/05/2024)    Readmission Risk Interventions    09/02/2024    9:20 AM  Readmission Risk Prevention Plan  Post Dischage Appt Complete  Medication Screening Complete  Transportation Screening Complete

## 2024-09-06 NOTE — Care Management Important Message (Signed)
 Important Message  Patient Details  Name: Megan Daniel MRN: [id number] Date of Birth: Nov 03, 1935   Important Message Given:  Yes - Medicare IM     [name] 09/06/2024, 3:53 PM

## 2024-09-06 NOTE — NC FL2 (Signed)
 " [place]  MEDICAID FL2 LEVEL OF CARE FORM     IDENTIFICATION  Patient Name: Megan Daniel Birthdate: 07-07-1936 Sex: female Admission Date (Current Location): 08/31/2024  [facility] and [place] Number:  Producer, Television/film/video and Address:  The [place], [address], [place], [place] [address]      Provider Number: [id number]  Attending Physician Name and Address:  Donnamarie Lebron PARAS, MD  Relative Name and Phone Number:  Norma DELTA Soyla Leonel   [phone]    Current Level of Care: Hospital Recommended Level of Care: Memory Care [facility]) Prior Approval Number:    Date Approved/Denied:   PASRR Number:    Discharge Plan: Other (Comment) [facility] memory care)    Current Diagnoses: Patient Active Problem List   Diagnosis Date Noted   Closed compression fracture of L2 vertebra, initial encounter (HCC) 09/01/2024   Hypertension 03/21/2021   Thyroid  disease 03/21/2021   Arthritis 03/21/2021   Dysphagia 05/03/2019   Idiopathic small fiber peripheral neuropathy 12/31/2017   Carpal tunnel syndrome, left upper limb 11/19/2017   Idiopathic polyneuropathy 11/19/2017   Varicose veins of left lower extremity with complications 07/08/2016   Headache 04/08/2014   Joint pain 04/08/2014   Other malaise and fatigue 04/08/2014   Neuropathy 04/08/2014   Achalasia 04/03/2014   History of colonic polyps 04/03/2014   GERD (gastroesophageal reflux disease) 04/03/2014    Orientation RESPIRATION BLADDER Height & Weight     Self, Place  Normal Incontinent Weight: 123 lb 7.3 oz (56 kg) Height:  5' 5 (165.1 cm)  BEHAVIORAL SYMPTOMS/MOOD NEUROLOGICAL BOWEL NUTRITION STATUS        Diet (regular diet)  AMBULATORY STATUS COMMUNICATION OF NEEDS Skin   Limited Assist Verbally Other (Comment), Skin abrasions, Surgical wounds (redness)                       Personal Care Assistance Level of Assistance  Bathing, Feeding, Dressing Bathing  Assistance: Maximum assistance Feeding assistance: Limited assistance Dressing Assistance: Maximum assistance     Functional Limitations Info             SPECIAL CARE FACTORS FREQUENCY  PT (By licensed PT), OT (By licensed OT)     PT Frequency: evaluate and treat OT Frequency: evaluate and treat            Contractures Contractures Info: Not present    Additional Factors Info  Code Status, Allergies Code Status Info: full Allergies Info: Dexmedetomidine, Ibuprofen, Zyrtec (Cetirizine), Flexeril  (Cyclobenzaprine )           Current Medications (09/06/2024):  This is the current hospital active medication list Current Facility-Administered Medications  Medication Dose Route Frequency Provider Last Rate Last Admin   0.9 %  sodium chloride  infusion   Intravenous Continuous Ezenduka, Nkeiruka J, MD 100 mL/hr at 09/06/24 0836 New Bag at 09/06/24 0836   acetaminophen  (TYLENOL ) tablet 650 mg  650 mg Oral Q6H PRN Garst, Jonathan R, MD   650 mg at 09/04/24 8162   Or   acetaminophen  (TYLENOL ) suppository 650 mg  650 mg Rectal Q6H PRN Garst, Jonathan R, MD       albuterol  (PROVENTIL ) (2.5 MG/3ML) 0.083% nebulizer solution 2.5 mg  2.5 mg Nebulization Q2H PRN Darnella Dorn SAUNDERS, MD       enoxaparin  (LOVENOX ) injection 40 mg  40 mg Subcutaneous Q24H Garst, Jonathan R, MD   40 mg at 09/04/24 1726   levothyroxine  (SYNTHROID ) tablet 112 mcg  112 mcg Oral QAC breakfast Darnella Dorn SAUNDERS, MD   112 mcg at 09/06/24 0522   lidocaine  (LIDODERM ) 5 % 1 patch  1 patch Transdermal Q24H Garst, Jonathan R, MD   1 patch at 09/06/24 9160   methocarbamol  (ROBAXIN ) tablet 500 mg  500 mg Oral TID Garst, Jonathan R, MD   500 mg at 09/05/24 2258   ondansetron  (ZOFRAN ) tablet 4 mg  4 mg Oral Q6H PRN Darnella Dorn SAUNDERS, MD       Or   ondansetron  (ZOFRAN ) injection 4 mg  4 mg Intravenous Q6H PRN Darnella Dorn SAUNDERS, MD       oxyCODONE  (Oxy IR/ROXICODONE ) immediate release tablet 5 mg  5 mg Oral Q4H PRN Garst,  Jonathan R, MD   5 mg at 09/03/24 2123   risperiDONE  (RISPERDAL ) tablet 0.5 mg  0.5 mg Oral QHS Garst, Jonathan R, MD   0.5 mg at 09/05/24 2258   senna (SENOKOT) tablet 8.6 mg  1 tablet Oral Daily PRN Darnella Dorn SAUNDERS, MD       traZODone  (DESYREL ) tablet 25 mg  25 mg Oral QHS PRN Garst, Jonathan R, MD   25 mg at 09/03/24 2123     Discharge Medications: Please see discharge summary for a list of discharge medications.  Relevant Imaging Results:  Relevant Lab Results:   Additional Information SSN: [id number]  Bridget Cordella Simmonds, LCSW     "

## 2024-09-07 LAB — CBC WITH DIFFERENTIAL/PLATELET
Abs Immature Granulocytes: 0.03 10*3/uL (ref 0.00–0.07)
Basophils Absolute: 0 10*3/uL (ref 0.0–0.1)
Basophils Relative: 0 %
Eosinophils Absolute: 0.1 10*3/uL (ref 0.0–0.5)
Eosinophils Relative: 1 %
HCT: 32 % — ABNORMAL LOW (ref 36.0–46.0)
Hemoglobin: 10.4 g/dL — ABNORMAL LOW (ref 12.0–15.0)
Immature Granulocytes: 0 %
Lymphocytes Relative: 52 %
Lymphs Abs: 3.7 10*3/uL (ref 0.7–4.0)
MCH: 31.2 pg (ref 26.0–34.0)
MCHC: 32.5 g/dL (ref 30.0–36.0)
MCV: 96.1 fL (ref 80.0–100.0)
Monocytes Absolute: 0.3 10*3/uL (ref 0.1–1.0)
Monocytes Relative: 5 %
Neutro Abs: 3 10*3/uL (ref 1.7–7.7)
Neutrophils Relative %: 42 %
Platelets: 144 10*3/uL — ABNORMAL LOW (ref 150–400)
RBC: 3.33 MIL/uL — ABNORMAL LOW (ref 3.87–5.11)
RDW: 13.3 % (ref 11.5–15.5)
WBC: 7.2 10*3/uL (ref 4.0–10.5)
nRBC: 0.3 % — ABNORMAL HIGH (ref 0.0–0.2)

## 2024-09-07 LAB — BASIC METABOLIC PANEL WITH GFR
Anion gap: 9 (ref 5–15)
BUN: 24 mg/dL — ABNORMAL HIGH (ref 8–23)
CO2: 23 mmol/L (ref 22–32)
Calcium: 8.9 mg/dL (ref 8.9–10.3)
Chloride: 107 mmol/L (ref 98–111)
Creatinine, Ser: 0.55 mg/dL (ref 0.44–1.00)
GFR, Estimated: >60 mL/min
Glucose, Bld: 80 mg/dL (ref 70–99)
Potassium: 4.2 mmol/L (ref 3.5–5.1)
Sodium: 139 mmol/L (ref 135–145)

## 2024-09-07 MED ORDER — LIDOCAINE 5 % EX PTCH: 1.0000 | MEDICATED_PATCH | CUTANEOUS | 0 refills | Status: AC

## 2024-09-07 MED ORDER — METHOCARBAMOL 500 MG PO TABS: 500.0000 mg | ORAL_TABLET | Freq: Three times a day (TID) | ORAL | 0 refills | Status: AC | PRN

## 2024-09-07 MED ORDER — OXYCODONE HCL 5 MG PO TABS: 5.0000 mg | ORAL_TABLET | ORAL | 0 refills | Status: AC | PRN

## 2024-09-07 NOTE — Discharge Summary (Signed)
 " Physician Discharge Summary   Patient: Megan Daniel MRN: [id number] DOB: Sep 21, 1935  Admit date:     08/31/2024  Discharge date: 09/08/24  Discharge Physician: Garnette Pelt   PCP: Patient, No Pcp Per   Recommendations at discharge:    Follow up with PCP in 1-2 weeks Follow up with Neurosurgery as scheduled Hold aspirin until 2/9 per Neurosurgery recs  Discharge Diagnoses: Principal Problem:   Closed compression fracture of L2 vertebra, initial encounter [facility])  Resolved Problems:   * No resolved hospital problems. 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 CT head negative for ICH, CT cervical spine negative for fracture, however CT lumbar spine showed acute L2 burst fracture with 8 mm retropulsion, no evidence of cord compression. Neurosurgery Dr. Darnella recommended TLSO brace, PT/OT and plan for L2 kyphoplasty on 09/05/24. MRI was deferred by neurosurgery.   Assessment and Plan: Acute L2 burst fracture with retropulsion CT lumbar spine showed above Unable to place TLSO due to stool incontinence Neurosurgery on board, s/p L2 kyphoplasty on 09/05/2024 Pain management Pt to follow up with Neurosurgery as scheduled   Hx of Hypertension Noted to be hypotensive on 2/3 (likely post op with poor oral intake) Needed IV albumin  post op, Verapamil  held secondary to hypotension Please follow BP trends    Hypothyroidism Continue Synthroid    Osteoarthritis Chronic lumbago Outpatient follow-up   Dementia Resumed Risperdal  0.5 mg at bedtime       Consultants: Neurosurgery Procedures performed: Kyphoplasty  Disposition: Memory care Diet recommendation:  Regular diet DISCHARGE MEDICATION: Allergies as of 09/08/2024       Reactions   Dexmedetomidine Other (See  Comments)   Unknown   Ibuprofen Other (See Comments)   Heartburn   Zyrtec [cetirizine] Other (See Comments)   Unknown   Flexeril  [cyclobenzaprine ] Anxiety        Medication List     PAUSE taking these medications    aspirin EC 81 MG tablet Wait to take this until: September 12, 2024 Take 81 mg by mouth daily. Swallow whole.       STOP taking these medications    verapamil  240 MG CR tablet Commonly known as: CALAN -SR       TAKE these medications    acetaminophen  500 MG tablet Commonly known as: TYLENOL  Take 500 mg by mouth every 6 (six) hours as needed for mild pain (pain score 1-3) or moderate pain (pain score 4-6).   Allegra Allergy 180 MG tablet Generic drug: fexofenadine Take 180 mg by mouth daily.   atorvastatin  40 MG tablet Commonly known as: LIPITOR Take 40 mg by mouth every evening.   furosemide 20 MG tablet Commonly known as: LASIX Take 20 mg by mouth daily.   levothyroxine  112 MCG tablet Commonly known as: SYNTHROID  Take 112 mcg by mouth daily before breakfast.   lidocaine  5 % Commonly known as: LIDODERM  Place 1 patch onto the skin daily. Remove & Discard patch within 12 hours or as directed by MD   loperamide 2 MG tablet Commonly known as: IMODIUM A-D Take 2 mg by mouth every 6 (six) hours as needed for diarrhea or loose stools.   methocarbamol  500 MG tablet Commonly known as: ROBAXIN  Take 1 tablet (500 mg total) by mouth every 8 (eight) hours as needed for muscle  spasms.   multivitamin with minerals Tabs tablet Take 1 tablet by mouth daily.   oxyCODONE  5 MG immediate release tablet Commonly known as: Oxy IR/ROXICODONE  Take 1 tablet (5 mg total) by mouth every 4 (four) hours as needed for moderate pain (pain score 4-6).   risperiDONE  0.5 MG tablet Commonly known as: RISPERDAL  Take 0.5 mg by mouth at bedtime.   senna 8.6 MG Tabs tablet Commonly known as: SENOKOT Take 1 tablet by mouth daily as needed for mild constipation or  moderate constipation.   traZODone  50 MG tablet Commonly known as: DESYREL  Take 50 mg by mouth at bedtime as needed for sleep.         Follow-up Information     Darnella Dorn SAUNDERS, MD. Call in 6 week(s).   Specialty: Neurosurgery Contact information: [address] [place], [address] [place] [address] [phone]         Follow up with PCPin 1-2 weeks Follow up.   Why: Hospital follow up               Discharge Exam: Filed Weights   09/01/24 1514 09/05/24 1237  Weight: 56 kg 56 kg   General exam: Awake, laying in bed, in nad Respiratory system: Normal respiratory effort, no wheezing Cardiovascular system: regular rate, s1, s2 Gastrointestinal system: Soft, nondistended, positive BS Central nervous system: CN2-12 grossly intact, strength intact Extremities: Perfused, no clubbing Skin: Normal skin turgor, no notable skin lesions seen Psychiatry: Mood normal // affect seems normal  Condition at discharge: fair  The results of significant diagnostics from this hospitalization (including imaging, microbiology, ancillary and laboratory) are listed below for reference.   Imaging Studies: DG THORACOLUMBAR SPINE Result Date: 09/05/2024 CLINICAL DATA:  Elective surgery.  L2 kyphoplasty. EXAM: THORACOLUMBAR SPINE 2V COMPARISON:  Radiograph 09/02/2024 FINDINGS: Five fluoroscopic spot views of the thoracolumbar spine submitted from the operating room. Imaging obtained during kyphoplasty of L2 fracture. Fluoroscopy time 2 minutes 14 seconds. Dose 32.9 mGy. IMPRESSION: Procedural fluoroscopy during kyphoplasty. Electronically Signed   By: Andrea Gasman M.D.   On: 09/05/2024 18:45   DG C-Arm 1-60 Min-No Report Result Date: 09/05/2024 Fluoroscopy was utilized by the requesting physician.  No radiographic interpretation.   DG C-Arm 1-60 Min-No Report Result Date: 09/05/2024 Fluoroscopy was utilized by the requesting physician.  No radiographic interpretation.   DG  THORACOLUMABAR SPINE Result Date: 09/02/2024 CLINICAL DATA:  Low back pain. CT dated 09/01/2024 with acute L2 burst fracture with osseous retropulsion and fracture of the anterior superior corner of the L4 vertebral body. EXAM: THORACOLUMBAR SPINE 1V COMPARISON:  CT of the lumbar spine dated 09/01/2024. FINDINGS: Diffuse osseous demineralization, which limits the sensitivity of the exam. Redemonstrated L2 burst fracture with moderate compression deformity and osseous retropulsion into the central canal, better evaluated on the prior CT dated 09/01/2024. Fracture of the anterior superior L4 vertebral body is not well visualized on this exam and better evaluated on the prior CT dated 09/01/2024. Suspected mild age-indeterminate compression deformities of the T6, T7, and T8 vertebral bodies, although evaluation is limited due to diffuse osseous demineralization. IMPRESSION: 1. Redemonstrated L2 burst fracture with moderate compression deformity and osseous retropulsion into the central canal, better evaluated on the prior CT dated 09/01/2024. 2. Fracture of the anterior superior L4 vertebral body is not well visualized on this exam and better evaluated on the prior CT dated 09/01/2024. 3. Suspected mild age-indeterminate compression deformities of the T6, T7, and T8 vertebral bodies, although evaluation is limited due  to diffuse osseous demineralization. Electronically Signed   By: Harrietta Sherry M.D.   On: 09/02/2024 13:37   CT Lumbar Spine Wo Contrast Result Date: 09/01/2024 EXAM: CT OF THE LUMBAR SPINE WITHOUT CONTRAST 09/01/2024 01:49:04 AM TECHNIQUE: CT of the lumbar spine was performed without the administration of intravenous contrast. Multiplanar reformatted images are provided for review. Automated exposure control, iterative reconstruction, and/or weight based adjustment of the mA/kV was utilized to reduce the radiation dose to as low as reasonably achievable. COMPARISON: X-ray lumbar spine 08/31/2024.  CLINICAL HISTORY: Low back pain, trauma. FINDINGS: BONES AND ALIGNMENT: Grade 1 anterolisthesis of L2 on L3. Acute fracture of the L2 level involving the superior and inferior endplates as well as anterior and posterior walls. Associated 8 mm retropulsion into the central canal. Associated at least 35% vertebral body height loss centrally. Grade 2 anterolisthesis of L4 on L5. Acute fracture of the L4 level involving the anterior superior corner. At least mild to moderate  osseous central canal stenosis at the L2 level. No severe osseous neural foraminal or central canal stenosis. DEGENERATIVE CHANGES: Views to decrease bone density. Multilevel degenerative changes of the spine most prominent at the L4-L5, L5-S1 levels with intervertebral disc space vacuum phenomenon as well as endplate sclerosis. SOFT TISSUES: Atherosclerotic plaque of the aorta. Colonic diverticulosis. IMPRESSION: 1. Acute L2 complete burst fracture with 8 mm retropulsion into the central canal and at least 35% central height loss. Recommend MRI of the lumbar spine for further evaluation. 2. Acute fracture of the L4 vertebral body involving the anterior superior corner. 3. Diffusely decreased bone density. Electronically signed by: Morgane Naveau MD 09/01/2024 01:57 AM EST RP Workstation: [id number]   CT CERVICAL SPINE WO CONTRAST Result Date: 09/01/2024 EXAM: CT CERVICAL SPINE WITHOUT CONTRAST 09/01/2024 12:06:44 AM TECHNIQUE: CT of the cervical spine was performed without the administration of intravenous contrast. Multiplanar reformatted images are provided for review. Automated exposure control, iterative reconstruction, and/or weight based adjustment of the mA/kV was utilized to reduce the radiation dose to as low as reasonably achievable. COMPARISON: 02/09/2024. CLINICAL HISTORY: Recent fall with neck pain. FINDINGS: BONES AND ALIGNMENT: 7 cervical segments are well visualized. Vertebral body height is well maintained. Degenerative  anterolisthesis of C2 on C3 is seen. This is stable from the prior exam. No acute fracture or acute facet abnormality is noted. DEGENERATIVE CHANGES: Osteophytic changes are noted with facet hypertrophic changes. SOFT TISSUES: No prevertebral soft tissue swelling. Surrounding soft tissue structures are within normal limits. LUNG APICES: Visualized lung apices demonstrate some scarring in the right apex. IMPRESSION: 1. Degenerative change without acute abnormality. Electronically signed by: Oneil Devonshire MD 09/01/2024 12:27 AM EST RP Workstation: [id number]   CT HEAD WO CONTRAST ( ) Result Date: 09/01/2024 EXAM: CT HEAD WITHOUT CONTRAST 09/01/2024 12:06:44 AM TECHNIQUE: CT of the head was performed without the administration of intravenous contrast. Automated exposure control, iterative reconstruction, and/or weight based adjustment of the mA/kV was utilized to reduce the radiation dose to as low as reasonably achievable. COMPARISON: 02/09/2024 CLINICAL HISTORY: Recent falls with headaches FINDINGS: BRAIN AND VENTRICLES: No acute hemorrhage. No evidence of acute infarct. No extra-axial collection. No mass effect or midline shift. Age-related cerebral volume loss and mild periventricular white matter disease. Mild calcific atheromatous disease. ORBITS: No acute abnormality. Status post bilateral lens replacement. SINUSES: No acute abnormality. SOFT TISSUES AND SKULL: Mild posterior scalp soft tissue swelling. No skull fracture. IMPRESSION: 1. Chronic atrophic and ischemic changes without acute abnormality. 2. Mild posterior scalp soft tissue  swelling, possibly related to the reported head trauma. Electronically signed by: Oneil Devonshire MD 09/01/2024 12:24 AM EST RP Workstation: [id number]   DG Lumbar Spine 2-3 Views Result Date: 09/01/2024 EXAM: 2 OR 3 VIEW(S) XRAY OF THE LUMBAR SPINE 08/31/2024 11:47:00 PM COMPARISON: 03/21/21 CLINICAL HISTORY: fall with low back pain FINDINGS: LUMBAR SPINE: BONES: 5  non-weightbearing lumbar vertebrae are seen. Mild vertebral body height loss is noted at L2 and L4, new from the prior exam. This is of uncertain chronicity and CT may be helpful for further evaluation. DISCS AND DEGENERATIVE CHANGES: Anterolisthesis of L4 on L5 is noted, similar to that seen on the prior exam. SOFT TISSUES: No acute abnormality. IMPRESSION: 1. Impression deformity is at L2 and L4, new from the prior study from 2022 but of uncertain chronicity. CT may be helpful. Electronically signed by: Oneil Devonshire MD 09/01/2024 12:03 AM EST RP Workstation: [id number]   DG Hips Bilat W or Wo Pelvis 2 Views Result Date: 08/31/2024 EXAM: 2 VIEW(S) XRAY OF THE PELVIS AND BILATERAL HIP 08/31/2024 11:47:00 PM COMPARISON: None available. CLINICAL HISTORY: Recent fall with hip pain. FINDINGS: BONES AND JOINTS: Pelvic ring is intact. Sclerosis is noted along the left lesser trochanter and extending into the femoral neck, likely chronic in nature, although no prior examination is available for comparison. No acute fracture is seen. Bilateral hips demonstrate normal alignment. SOFT TISSUES: Considerable retained fecal material is noted, consistent with degree of constipation. IMPRESSION: 1. No acute fracture. 2. Sclerosis along the left lesser trochanter extending into the femoral neck, likely chronic in nature; no prior examination is available for comparison. Electronically signed by: Oneil Devonshire MD 08/31/2024 11:59 PM EST RP Workstation: [id number]    Microbiology: Results for orders placed or performed during the hospital encounter of 08/31/24  Surgical pcr screen     Status: Abnormal   Collection Time: 09/05/24  8:40 AM   Specimen: Nasal Mucosa; Nasal Swab  Result Value Ref Range Status   MRSA, PCR NEGATIVE NEGATIVE Final   Staphylococcus aureus POSITIVE (A) NEGATIVE Final    Comment: (NOTE) The Xpert SA Assay (FDA approved for NASAL specimens in patients 13 years of age and older), is one component of  a comprehensive surveillance program. It is not intended to diagnose infection nor to guide or monitor treatment. Performed at [facility], [address], [place], [place] [address]     Labs: CBC: Recent Labs  Lab 09/02/24 [phone] 09/05/24 0347 09/06/24 0848 09/07/24 0524  WBC 8.6 7.2 7.2 7.2  NEUTROABS  --  2.5  --  3.0  HGB 10.0* 10.5* 10.4* 10.4*  HCT 30.9* 30.7* 32.1* 32.0*  MCV 96.3 94.2 95.8 96.1  PLT 116* 131* 144* 144*   Basic Metabolic Panel: Recent Labs  Lab 09/02/24 0551 09/05/24 0347 09/07/24 0524  NA 140 141 139  K 3.9 4.1 4.2  CL 106 106 107  CO2 28 25 23   GLUCOSE 115* 74 80  BUN 17 15 24*  CREATININE 0.62 0.59 0.55  CALCIUM  9.6 8.9 8.9   Liver Function Tests: No results for input(s): AST, ALT, ALKPHOS, BILITOT, PROT, ALBUMIN  in the last 168 hours. CBG: No results for input(s): GLUCAP in the last 168 hours.  Discharge time spent: less than 30 minutes.  Signed: Garnette Pelt, MD [facility] 09/08/2024 "

## 2024-09-07 NOTE — Anesthesia Postprocedure Evaluation (Signed)
"   Anesthesia Post Note  Patient: Megan Daniel  Procedure(s) Performed: KYPHOPLASTY LUMBAR TWO     Patient location during evaluation: PACU Anesthesia Type: General Level of consciousness: awake Pain management: pain level controlled Vital Signs Assessment: post-procedure vital signs reviewed and stable Respiratory status: spontaneous breathing Cardiovascular status: blood pressure returned to baseline Postop Assessment: no apparent nausea or vomiting Anesthetic complications: no   No notable events documented.                Lauraine DASEN Colhoun      "

## 2024-09-07 NOTE — Progress Notes (Signed)
 Occupational Therapy Treatment Patient Details Name: Megan Daniel MRN: [id number] DOB: November 29, 1935 Today's Date: 09/07/2024   History of present illness 89 y.o. female who suffered a mechanical fall at her facility  and is admitted to the hospital for L2 compression fracture. S/p L2 kyphoplasty 2/2. Pt with medical history significant for dementia, hypertension, hypothyroidism, RA   OT comments  Pt cooperative and pleasant. Readily willing to engage in ADL training with OT. Pt needs mod to max assist for bed mobility. Moderate assistance to don gown and set up to min assist for 4 grooming activities at EOB. Pt demonstrates fair sitting balance. Posterior bias noted in standing with pt requiring moderate assistance statically. Patient will benefit from continued inpatient follow up therapy, <3 hours/day.      If plan is discharge home, recommend the following:  Two people to help with walking and/or transfers;Assistance with cooking/housework;Direct supervision/assist for medications management;Direct supervision/assist for financial management;Assistance with feeding;Help with stairs or ramp for entrance;Assist for transportation;A lot of help with bathing/dressing/bathroom   Equipment Recommendations  Other (comment) (defer)    Recommendations for Other Services      Precautions / Restrictions Precautions Precautions: Fall Recall of Precautions/Restrictions: Impaired Precaution/Restrictions Comments: Does not need strict spinal precautions. Required Braces or Orthoses: Spinal Brace Spinal Brace: Thoracolumbosacral orthotic;Other (comment) (for comfort only) Restrictions Weight Bearing Restrictions Per Provider Order: No       Mobility Bed Mobility Overal bed mobility: Needs Assistance Bed Mobility: Rolling, Sidelying to Sit, Sit to Supine Rolling: Mod assist Sidelying to sit: Max assist   Sit to supine: Mod assist   General bed mobility comments: used log roll to get to  EOB for comfort, rolled for removal of bed pan    Transfers Overall transfer level: Needs assistance Equipment used: Rolling walker (2 wheels), None Transfers: Sit to/from Stand Sit to Stand: Mod assist           General transfer comment: assist to rise and steady, pt with posterior lean     Balance Overall balance assessment: Needs assistance   Sitting balance-Leahy Scale: Fair                                     ADL either performed or assessed with clinical judgement   ADL Overall ADL's : Needs assistance/impaired     Grooming: Wash/dry hands;Wash/dry face;Oral care;Brushing hair;Set up;Sitting           Upper Body Dressing : Moderate assistance;Bed level           Toileting- Clothing Manipulation and Hygiene: Total assistance;Bed level              Extremity/Trunk Assessment              Vision       Perception     Praxis     Communication Communication Communication: No apparent difficulties   Cognition Arousal: Alert Behavior During Therapy: WFL for tasks assessed/performed Cognition: History of cognitive impairments                               Following commands: Impaired Following commands impaired: Follows one step commands with increased time      Cueing   Cueing Techniques: Tactile cues, Verbal cues  Exercises      Shoulder Instructions       General Comments  Pertinent Vitals/ Pain       Pain Assessment Pain Assessment: Faces Faces Pain Scale: Hurts little more Pain Location: back Pain Descriptors / Indicators: Grimacing, Guarding Pain Intervention(s): Monitored during session, Repositioned  Home Living                                          Prior Functioning/Environment              Frequency  Min 2X/week        Progress Toward Goals  OT Goals(current goals can now be found in the care plan section)  Progress towards OT goals: Progressing  toward goals  Acute Rehab OT Goals OT Goal Formulation: Patient unable to participate in goal setting Time For Goal Achievement: 09/16/24 Potential to Achieve Goals: Fair  Plan      Co-evaluation                 AM-PAC OT 6 Clicks Daily Activity     Outcome Measure   Help from another person eating meals?: A Little Help from another person taking care of personal grooming?: A Little Help from another person toileting, which includes using toliet, bedpan, or urinal?: Total Help from another person bathing (including washing, rinsing, drying)?: A Lot Help from another person to put on and taking off regular upper body clothing?: A Lot Help from another person to put on and taking off regular lower body clothing?: Total 6 Click Score: 12    End of Session Equipment Utilized During Treatment: Gait belt;Rolling walker (2 wheels)  OT Visit Diagnosis: Pain;Unsteadiness on feet (R26.81);Other abnormalities of gait and mobility (R26.89);History of falling (Z91.81)   Activity Tolerance Patient tolerated treatment well   Patient Left in bed;with call bell/phone within reach;with bed alarm set   Nurse Communication          Time: 9056-8984 OT Time Calculation (min): 32 min  Charges: OT General Charges $OT Visit: 1 Visit OT Treatments $Self Care/Home Management : 23-37 mins  Mliss HERO, OTR/L Acute Rehabilitation Services Office: [phone]  Kennth Mliss Helling 09/07/2024, 11:05 AM

## 2024-09-07 NOTE — TOC Progression Note (Addendum)
 Transition of Care [facility]) - Progression Note    Patient Details  Name: Megan Daniel MRN: [id number] Date of Birth: 21-Nov-1935  [facility] CM/SW Contact  Bridget Cordella Simmonds, LCSW Phone Number: 09/07/2024, 10:54 AM  Clinical Narrative:   DC summary and FL2 faxed to [facility] house.   1245: CSW called [employer], was told they are still reviewing DC summary.  Informed them of pt stable for DC today.   Expected Discharge Plan: Memory Care Barriers to Discharge: No Barriers Identified, Continued Medical Work up               Expected Discharge Plan and Services In-house Referral: NA Discharge Planning Services: CM Consult Post Acute Care Choice: NA Living arrangements for the past 2 months: Assisted Living Facility Expected Discharge Date: 09/07/24               DME Arranged: N/A DME Agency: NA       HH Arranged: NA HH Agency: NA         Social Drivers of Health (SDOH) Interventions SDOH Screenings   Food Insecurity: Patient Unable To Answer (09/01/2024)  Housing: Unknown (09/01/2024)  Transportation Needs: Patient Unable To Answer (09/01/2024)  Utilities: Patient Unable To Answer (09/01/2024)  Social Connections: Unknown (09/01/2024)  Tobacco Use: Medium Risk (09/05/2024)    Readmission Risk Interventions    09/02/2024    9:20 AM  Readmission Risk Prevention Plan  Post Dischage Appt Complete  Medication Screening Complete  Transportation Screening Complete

## 2024-09-07 NOTE — Hospital Course (Signed)
 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 CT head negative for ICH, CT cervical spine negative for fracture, however CT lumbar spine showed acute L2 burst fracture with 8 mm retropulsion, no evidence of cord compression. Neurosurgery Dr. Darnella recommended TLSO brace, PT/OT and plan for L2 kyphoplasty on 09/05/24. MRI was deferred by neurosurgery.

## 2024-09-08 MED ORDER — MUPIROCIN 2 % EX OINT
1.0000 | TOPICAL_OINTMENT | Freq: Two times a day (BID) | CUTANEOUS | Status: DC
  Administered 2024-09-08: 1 via NASAL
  Filled 2024-09-08: qty 22

## 2024-09-08 MED ORDER — ENSURE PLUS HIGH PROTEIN PO LIQD: 237.0000 mL | Freq: Two times a day (BID) | ORAL | Status: DC

## 2024-09-08 NOTE — [place] FL2 (Signed)
 " [place]  MEDICAID FL2 LEVEL OF CARE FORM     IDENTIFICATION  Patient Name: Megan Daniel Birthdate: 11/16/1935 Sex: female Admission Date (Current Location): 08/31/2024  [facility] and [place] Number:  Producer, Television/film/video and Address:  [facility], [address] [place], [place] [address]      Provider Number: [id number]  Attending Physician Name and Address:  Cindy Garnette POUR, MD  Relative Name and Phone Number:  Norma DELTA Soyla Leonel   [phone]    Current Level of Care: Hospital Recommended Level of Care: Skilled Nursing Facility Prior Approval Number:    Date Approved/Denied:   PASRR Number: [id number]  Discharge Plan: SNF    Current Diagnoses: Patient Active Problem List   Diagnosis Date Noted   Closed compression fracture of L2 vertebra, initial encounter (HCC) 09/01/2024   Hypertension 03/21/2021   Thyroid  disease 03/21/2021   Arthritis 03/21/2021   Dysphagia 05/03/2019   Idiopathic small fiber peripheral neuropathy 12/31/2017   Carpal tunnel syndrome, left upper limb 11/19/2017   Idiopathic polyneuropathy 11/19/2017   Varicose veins of left lower extremity with complications 07/08/2016   Headache 04/08/2014   Joint pain 04/08/2014   Other malaise and fatigue 04/08/2014   Neuropathy 04/08/2014   Achalasia 04/03/2014   History of colonic polyps 04/03/2014   GERD (gastroesophageal reflux disease) 04/03/2014    Orientation RESPIRATION BLADDER Height & Weight     Self, Place  Normal Incontinent Weight: 123 lb 7.3 oz (56 kg) Height:  5' 5 (165.1 cm)  BEHAVIORAL SYMPTOMS/MOOD NEUROLOGICAL BOWEL NUTRITION STATUS        Diet (regular diet)  AMBULATORY STATUS COMMUNICATION OF NEEDS Skin   Limited Assist Verbally Other (Comment), Skin abrasions, Surgical wounds (redness)                       Personal Care Assistance Level of Assistance  Bathing, Feeding, Dressing Bathing Assistance: Maximum assistance Feeding  assistance: Limited assistance Dressing Assistance: Maximum assistance     Functional Limitations Info             SPECIAL CARE FACTORS FREQUENCY  PT (By licensed PT), OT (By licensed OT)     PT Frequency: 5x week OT Frequency: 5x week            Contractures Contractures Info: Not present    Additional Factors Info  Code Status, Allergies Code Status Info: full Allergies Info: Dexmedetomidine, Ibuprofen, Zyrtec (Cetirizine), Flexeril  (Cyclobenzaprine )           Current Medications (09/08/2024):  This is the current hospital active medication list Current Facility-Administered Medications  Medication Dose Route Frequency Provider Last Rate Last Admin   acetaminophen  (TYLENOL ) tablet 650 mg  650 mg Oral Q6H PRN Garst, Jonathan R, MD   650 mg at 09/07/24 1150   Or   acetaminophen  (TYLENOL ) suppository 650 mg  650 mg Rectal Q6H PRN Garst, Jonathan R, MD       albuterol  (PROVENTIL ) (2.5 MG/3ML) 0.083% nebulizer solution 2.5 mg  2.5 mg Nebulization Q2H PRN Darnella Dorn SAUNDERS, MD       levothyroxine  (SYNTHROID ) tablet 112 mcg  112 mcg Oral QAC breakfast Darnella Dorn SAUNDERS, MD   112 mcg at 09/08/24 0554   lidocaine  (LIDODERM ) 5 % 1 patch  1 patch Transdermal Q24H Darnella Dorn SAUNDERS, MD   1 patch at 09/08/24 9077   methocarbamol  (ROBAXIN ) tablet 500 mg  500 mg Oral TID Darnella Dorn SAUNDERS, MD  500 mg at 09/08/24 9077   ondansetron  (ZOFRAN ) tablet 4 mg  4 mg Oral Q6H PRN Darnella Dorn SAUNDERS, MD       Or   ondansetron  (ZOFRAN ) injection 4 mg  4 mg Intravenous Q6H PRN Darnella Dorn SAUNDERS, MD   4 mg at 09/07/24 1156   oxyCODONE  (Oxy IR/ROXICODONE ) immediate release tablet 5 mg  5 mg Oral Q4H PRN Garst, Jonathan R, MD   5 mg at 09/03/24 2123   risperiDONE  (RISPERDAL ) tablet 0.5 mg  0.5 mg Oral QHS Garst, Jonathan R, MD   0.5 mg at 09/07/24 2159   senna (SENOKOT) tablet 8.6 mg  1 tablet Oral Daily PRN Darnella Dorn SAUNDERS, MD       traZODone  (DESYREL ) tablet 25 mg  25 mg Oral QHS PRN Garst,  Jonathan R, MD   25 mg at 09/03/24 2123     Discharge Medications: Please see discharge summary for a list of discharge medications.  Relevant Imaging Results:  Relevant Lab Results:   Additional Information SSN: [id number]  Bridget Cordella Simmonds, LCSW     "

## 2024-09-08 NOTE — Plan of Care (Signed)
  Problem: Clinical Measurements: Goal: Will remain free from infection Outcome: Progressing   Problem: Coping: Goal: Level of anxiety will decrease Outcome: Progressing   Problem: Pain Managment: Goal: General experience of comfort will improve and/or be controlled Outcome: Progressing   Problem: Safety: Goal: Ability to remain free from injury will improve Outcome: Progressing

## 2024-09-08 NOTE — TOC Progression Note (Addendum)
 Transition of Care [facility]) - Progression Note    Patient Details  Name: Megan Daniel MRN: [id number] Date of Birth: April 13, 1936  [facility]) CM/SW Contact  Bridget Cordella Simmonds, LCSW Phone Number: 09/08/2024, 10:07 AM  Clinical Narrative:   CSW called [facility] memory care, was directed to PT at the facility.  After reviewing DC summary, they are unable to take pt back without her first doing STR at [facility].  They will contact pt nephew Will.    CSW reached out to POA/nephew Will.    1300: CSW spoke with Will, he had not been informed that [employer] now declining to receive pt without STR first.  He is agreeable to SNF, bed offers provided and CSW directed him to medicare.gov website to review ratings.  He will accept offer at [facility].  CSW confirmed with [facility]: they can receive pt today.  \  MD informed.   Medicare payer with inpt order 09/01/24.  Expected Discharge Plan: Memory Care Barriers to Discharge: No Barriers Identified, Continued Medical Work up               Expected Discharge Plan and Services In-house Referral: NA Discharge Planning Services: CM Consult Post Acute Care Choice: NA Living arrangements for the past 2 months: Assisted Living Facility Expected Discharge Date: 09/07/24               DME Arranged: N/A DME Agency: NA       HH Arranged: NA HH Agency: NA         Social Drivers of Health (SDOH) Interventions SDOH Screenings   Food Insecurity: Patient Unable To Answer (09/01/2024)  Housing: Unknown (09/01/2024)  Transportation Needs: Patient Unable To Answer (09/01/2024)  Utilities: Patient Unable To Answer (09/01/2024)  Social Connections: Unknown (09/01/2024)  Tobacco Use: Medium Risk (09/05/2024)    Readmission Risk Interventions    09/02/2024    9:20 AM  Readmission Risk Prevention Plan  Post Dischage Appt Complete  Medication Screening Complete  Transportation Screening Complete

## 2024-09-08 NOTE — Progress Notes (Signed)
 Report called to nurse at [facility] and Rehab of expected admission. [facility] transporting patient now.

## 2024-09-08 NOTE — TOC Transition Note (Signed)
 Transition of Care [facility]) - Discharge Note   Patient Details  Name: Megan Daniel MRN: [id number] Date of Birth: 1935/12/31  Transition of Care [facility]) CM/SW Contact:  Bridget Cordella Simmonds, LCSW Phone Number: 09/08/2024, 1:53 PM   Clinical Narrative:   Pt is discharging to [place]. RN call report to [phone].   PTAR called [phone].   Final next level of care: Skilled Nursing Facility Barriers to Discharge: Barriers Resolved   Patient Goals and CMS Choice Patient states their goals for this hospitalization and ongoing recovery are:: Home to [facility].gov Compare Post Acute Care list provided to:: Patient Represenative (must comment) (Will) Choice offered to / list presented to : [facility] POA / Guardian [place] ownership interest in [facility].provided to:: [facility] POA / Guardian    Discharge Placement              Patient chooses bed at: [facility] Patient to be transferred to facility by: [facility] Name of family member notified: POA Will/nephew Patient and family notified of of transfer: 09/08/24  Discharge Plan and Services Additional resources added to the After Visit Summary for   In-house Referral: NA Discharge Planning Services: CM Consult Post Acute Care Choice: NA          DME Arranged: N/A DME Agency: NA       HH Arranged: NA HH Agency: NA        Social Drivers of Health (SDOH) Interventions SDOH Screenings   Food Insecurity: Patient Unable To Answer (09/01/2024)  Housing: Unknown (09/01/2024)  Transportation Needs: Patient Unable To Answer (09/01/2024)  Utilities: Patient Unable To Answer (09/01/2024)  Social Connections: Unknown (09/01/2024)  Tobacco Use: Medium Risk (09/05/2024)     Readmission Risk Interventions    09/02/2024    9:20 AM  Readmission Risk Prevention Plan  Post Dischage Appt Complete  Medication Screening Complete  Transportation Screening Complete

## 2024-09-08 NOTE — Progress Notes (Signed)
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 CT head negative for ICH, CT cervical spine negative for fracture, however CT lumbar spine showed acute L2 burst fracture with 8 mm retropulsion, no evidence of cord compression. Neurosurgery Dr. Darnella recommended TLSO brace, PT/OT and plan for L2 kyphoplasty on 09/05/24. MRI was deferred by neurosurgery.   Assessment and Plan: Acute L2 burst fracture with retropulsion CT lumbar spine showed above Unable to place TLSO due to stool incontinence Neurosurgery on board, s/p L2 kyphoplasty on 09/05/2024 Pain management Pt to follow up with Neurosurgery as scheduled   Hx of Hypertension Noted to be hypotensive on 2/3 (likely post op with poor oral intake) Needed IV albumin  post op, Verapamil  held secondary to hypotension Please follow BP trends    Hypothyroidism Continue Synthroid    Osteoarthritis Chronic lumbago Outpatient follow-up   Dementia Resumed Risperdal  0.5 mg at bedtime         Subjective: No complaints  Physical Exam: Vitals:   09/07/24 1610 09/07/24 2009 09/08/24 0405 09/08/24 0754  BP: (!) 149/70 117/65 129/76 (!) 140/77  Pulse: 63 69 68 74  Resp:  16 16 16   Temp: 98.6 F (37 C) 98.3 F (36.8 C) 98.4 F (36.9 C) 98.3 F (36.8 C)  TempSrc:    Oral  SpO2: 97% 96% 94% 93%  Weight:      Height:       General exam: Awake, laying in bed, in nad Respiratory system: Normal respiratory effort, no wheezing Cardiovascular system: regular rate, s1, s2 Gastrointestinal system: Soft, nondistended, positive BS Central nervous system: CN2-12 grossly intact, strength  intact Extremities: Perfused, no clubbing Skin: Normal skin turgor, no notable skin lesions seen Psychiatry: Mood normal // affect seems normal   Data Reviewed:  There are no new results to review at this time.  Family Communication: Pt in room  Author: Garnette Pelt, MD 09/08/2024 3:58 PM  For on call review [email or web address].  "
